# Patient Record
Sex: Female | Born: 1965 | Race: White | Hispanic: No | Marital: Married | Smoking: Never smoker
Health system: Southern US, Community
[De-identification: ages and names within clinical notes are randomized; demographics above are authoritative.]

## PROBLEM LIST (undated history)

## (undated) DIAGNOSIS — E559 Vitamin D deficiency, unspecified: Secondary | ICD-10-CM

## (undated) DIAGNOSIS — E782 Mixed hyperlipidemia: Secondary | ICD-10-CM

## (undated) DIAGNOSIS — I10 Essential (primary) hypertension: Secondary | ICD-10-CM

## (undated) DIAGNOSIS — F419 Anxiety disorder, unspecified: Secondary | ICD-10-CM

## (undated) HISTORY — DX: Anxiety disorder, unspecified: F41.9

## (undated) HISTORY — PX: CHOLECYSTECTOMY: SHX55

## (undated) HISTORY — PX: TUBAL LIGATION: SHX77

## (undated) HISTORY — PX: APPENDECTOMY: SHX54

## (undated) HISTORY — DX: Vitamin D deficiency, unspecified: E55.9

## (undated) HISTORY — DX: Essential (primary) hypertension: I10

## (undated) HISTORY — DX: Mixed hyperlipidemia: E78.2

## (undated) HISTORY — PX: ANKLE FRACTURE SURGERY: SHX122

---

## 2014-06-02 DIAGNOSIS — J302 Other seasonal allergic rhinitis: Secondary | ICD-10-CM | POA: Insufficient documentation

## 2016-04-17 ENCOUNTER — Emergency Department
Admission: EM | Admit: 2016-04-17 | Discharge: 2016-04-17 | Disposition: A | Discharge status: Home / Self Care | Payer: Self-pay | Source: Ambulatory Visit | Attending: Emergency Medicine | Admitting: Emergency Medicine

## 2016-04-17 DIAGNOSIS — J069 Acute upper respiratory infection, unspecified: Secondary | ICD-10-CM

## 2016-04-17 LAB — POCT AMBULATORY RAPID STREP
Exp date: 2018
Lot #: 171282
Rapid Strep Group A Throat-POC: NEGATIVE

## 2016-04-17 LAB — HM HIV SCREENING OFFERED

## 2016-04-17 MED ORDER — IBUPROFEN 200 MG PO TABS *I*
600.0000 mg | ORAL_TABLET | Freq: Once | ORAL | Status: AC
Start: 2016-04-17 — End: 2016-04-17
  Administered 2016-04-17: 600 mg via ORAL
  Filled 2016-04-17: qty 3

## 2016-04-17 MED ORDER — BENZONATATE 200 MG PO CAPS *I*
200.0000 mg | ORAL_CAPSULE | Freq: Three times a day (TID) | ORAL | 0 refills | Status: AC | PRN
Start: 2016-04-17 — End: 2016-05-17

## 2016-04-17 MED ORDER — BENZONATATE 100 MG PO CAPS *I*
200.0000 mg | ORAL_CAPSULE | Freq: Once | ORAL | Status: AC
Start: 2016-04-17 — End: 2016-04-17
  Administered 2016-04-17: 200 mg via ORAL
  Filled 2016-04-17: qty 2

## 2016-04-17 NOTE — ED Notes (Signed)
Pt had chest x-ray. Awaiting results. Will continue care as ordered. Arna Snipehelsea Ramina Hulet, RN

## 2016-04-17 NOTE — ED Provider Notes (Signed)
History     Chief Complaint   Patient presents with    Sore Throat    Cough    Otalgia     HPI Comments: Loretta [name] is a 50 y.o. female previously healthy now presenting with cough, congestion, sore throat, left eye swelling, lost voice, ear pain/fullness.  Patient reportedly started having symptoms 2 days ago.  Patient reports that she has had difficulty taking anything orally due to the pain when swallowing.  Is swallowing secretions.  Also with runny eyes, mostly on the left eye, mild redness around the left eye.  Did take some ibuprofen 800mg  last night and children's claritin because had a hard time swallowing.  Nonsmoker.  Patient then came to [facility] Emergency Department for further management and care.        History provided by:  Patient  Language interpreter used: No      History reviewed. No pertinent past medical history.     Past Surgical History:   Procedure Laterality Date    APPENDECTOMY      CHOLECYSTECTOMY      TUBAL LIGATION       History reviewed. No pertinent family history.    Social History    reports that she has never smoked. She has never used smokeless tobacco. Her alcohol, drug, and sexual activity histories are not on file.    Living Situation     Questions Responses    Patient lives with Family    Homeless No    Caregiver for other family member No    External Services None    Employment Employed    Domestic Violence Risk No          Problem List   There is no problem list on file for this patient.      Review of Systems   Review of Systems   Constitutional: Positive for fatigue.   HENT: Positive for congestion and rhinorrhea.    Eyes: Positive for redness. Negative for pain.   Respiratory: Positive for cough and shortness of breath.    Cardiovascular: Negative for chest pain.   Gastrointestinal: Negative for abdominal pain.   Genitourinary: Negative for dysuria.   Musculoskeletal: Negative for back pain and neck pain.   Skin: Negative for rash.   Neurological: Positive  for headaches.   Hematological: Negative for adenopathy.   Psychiatric/Behavioral: Negative for agitation and behavioral problems.       Physical Exam     ED Triage Vitals   BP Heart Rate Heart Rate (via Pulse Ox) Resp Temp Temp src SpO2 (Retired) O2 Device O2 Flow Rate   04/17/16 0650 04/17/16 0650 -- 04/17/16 0650 04/17/16 0650 04/17/16 0650 04/17/16 0650 -- --   142/65 119  22 37.1 C (98.8 F) TEMPORAL 96 %        Weight           04/17/16 0650           113.4 kg (250 lb)                    Physical Exam   Constitutional: She is oriented to person, place, and time. She appears well-developed and well-nourished.   Appears uncomfortable but no acute distress.   HENT:   Head: Normocephalic and atraumatic.   Throat exam shows swelling in the pharynx, but no erythema, no exudates   Eyes: Conjunctivae are normal.   There is mild redness around the left eye.  There is  moderate conjunctival injection on the left side   Neck: Normal range of motion. Neck supple.   Cardiovascular: Normal rate.    Pulmonary/Chest: Effort normal and breath sounds normal. No respiratory distress. She has no wheezes.   Abdominal: Soft. She exhibits no distension. There is no tenderness.   Musculoskeletal: Normal range of motion. She exhibits no tenderness.   Neurological: She is alert and oriented to person, place, and time. No cranial nerve deficit. She exhibits normal muscle tone.   Skin: Skin is warm and dry.   Psychiatric: She has a normal mood and affect.   Nursing note and vitals reviewed.      Medical Decision Making        Initial Evaluation:  ED First Provider Contact     Date/Time Event User Comments    04/17/16 [phone] ED First Provider Contact Debera LatRAN, Love Chowning Initial Face to Face Provider Contact          Patient seen by me as above    Assessment:  50 y.o.female comes to the ED with 2 days of sore throat, lost voice, cough, subjective fevers, congestion, fullness in ears, redness in left eye, mild headache, no neck rigidity, vitals are  normal now, lungs are clear, there is swelling in the throat.    Differential Diagnosis includes strep pharyngitis, viral illness, dehydration, pneumonia, URI, conjunctivitis                      Plan:   Tessalon for cough  Ibuprofen for the pain  CXR  Rapid strep    CXR clear.  Strep test negative.  Likely viral illness given constellation of symptoms.  Will give script for tessalon for cough.  Encouraged rest, hydration, OTC pain meds.  Discharge with close follow up.      Debera LatHenry Alyssia Heese, MD           Debera Latran, Whitleigh Garramone, MD  04/17/16 [phone]

## 2016-04-17 NOTE — ED Triage Notes (Signed)
Pt comes in with c/o cough, congestion, sore throat, runny eyes. Pt difficulty speaking. Afebrile. Took ibuprofen at 2000 last eveningand kids's Claritin at midnight. Here for further evaluation.      Plan of Care: Will assess and monitor VS and pain q 2-4hrs. Perform frequent rounding. Provide education to patient/caregiver(s) about status and treatment of pt. Provide support to patient/caregiver(s) as needed. Pt oriented to room and use of call bell.           Triage Note   Arna Snipehelsea Tayonna Bacha, RN

## 2016-04-17 NOTE — Discharge Instructions (Signed)
You were evaluated today in the Emergency Department for sore throat, cough, congestion, ear fullness, eye redness.  Your studies showed normal chest x ray and negative strep test.  The diagnosis is likely upper respiratory viral infection.  It was determined that there was no need for hospitalization at this time and the best course of action is to follow up with your primary care doctor in 3-5 days.    You can take ibuprofen/motrin or tylenol/acetaminophen for further management of your pain.  Please take as directed on the box.  Please do not exceed 4 grams (4000 milligrams) of acetaminophen/tylenol in any given 24 hour period.    Please take tessalon for cough.  Prescription sent to your pharmacy for this.    Return to the ED if you experience worsening pain, fever not responsive to tylenol or ibuprofen, inability to take in fluids or food, focal weakness, or other concerning symptoms as these can be signs of a worsening condition. Please feel free to return to the ED for reevaluation at anytime if you feel the condition warrants such a visit.

## 2017-05-23 LAB — TREPONEMA PALLIDUM (SYPHILIS) ANTIBODY (HT)

## 2017-09-22 LAB — UNMAPPED LAB RESULTS
Basophil # (HT): 0 10 3/uL — NL (ref 0.0–0.2)
Basophil % (HT): 0.5 % — NL (ref 0.0–1.8)
Eosinophil # (HT): 0.1 10 3/uL — NL (ref 0.0–0.5)
Eosinophil % (HT): 0.8 % — NL (ref 0.0–7.0)
Hematocrit (HT): 41 % — NL (ref 34.0–47.0)
Hemoglobin (HGB) (HT): 14 g/dL — NL (ref 11.5–16.0)
Lymphocyte # (HT): 2.9 10 3/uL — NL (ref 0.9–3.8)
Lymphocyte % (HT): 33.9 % — NL (ref 17.0–44.0)
MCHC (HT): 34.1 g/dL — NL (ref 32.0–36.0)
MCV (HT): 81.8 FL — NL (ref 81.0–99.0)
Mean Corpuscular Hemoglobin (MCH) (HT): 27.9 pg — NL (ref 26.0–34.0)
Monocyte # (HT): 0.5 10 3/uL — NL (ref 0.2–1.0)
Monocyte % (HT): 5.7 % — NL (ref 4.0–12.0)
Neutrophil # (HT): 5 10 3/uL — NL (ref 1.5–7.7)
Platelets (HT): 321 10 3/uL — NL (ref 140–400)
RBC (HT): 5.01 10 6/uL — NL (ref 3.80–5.20)
RDW (HT): 13.1 % — NL (ref 11.5–15.0)
Seg Neut % (HT): 59.1 % — NL (ref 40.0–75.0)
WBC (HT): 8.5 10 3/uL — NL (ref 4.0–10.8)

## 2017-12-29 ENCOUNTER — Ambulatory Visit: Payer: Medicaid Other | Admitting: Physical Medicine and Rehabilitation

## 2017-12-29 ENCOUNTER — Encounter: Payer: Self-pay | Admitting: Physical Medicine and Rehabilitation

## 2017-12-29 ENCOUNTER — Ambulatory Visit
Admission: RE | Admit: 2017-12-29 | Discharge: 2017-12-29 | Disposition: A | Discharge status: Home / Self Care | Payer: Medicaid Other | Source: Ambulatory Visit | Attending: Physical Medicine and Rehabilitation | Admitting: Physical Medicine and Rehabilitation

## 2017-12-29 VITALS — BP 122/79 | HR 105

## 2017-12-29 DIAGNOSIS — S83241A Other tear of medial meniscus, current injury, right knee, initial encounter: Secondary | ICD-10-CM

## 2017-12-29 DIAGNOSIS — M25569 Pain in unspecified knee: Secondary | ICD-10-CM | POA: Insufficient documentation

## 2017-12-29 DIAGNOSIS — M25561 Pain in right knee: Secondary | ICD-10-CM | POA: Insufficient documentation

## 2017-12-29 DIAGNOSIS — M25562 Pain in left knee: Secondary | ICD-10-CM

## 2017-12-29 MED ORDER — MELOXICAM 15 MG PO TABS *I*
15.0000 mg | ORAL_TABLET | Freq: Every day | ORAL | 2 refills | Status: DC
Start: 2017-12-29 — End: 2018-04-03

## 2017-12-29 MED ORDER — TRIAMCINOLONE ACETONIDE 40 MG/ML IJ SUSP *I*
40.0000 mg | Freq: Once | INTRAMUSCULAR | Status: AC | PRN
Start: 2017-12-29 — End: 2017-12-29
  Administered 2017-12-29: 40 mg via INTRA_ARTICULAR

## 2017-12-29 MED ORDER — LIDOCAINE HCL 1 % IJ SOLN *I*
2.0000 mL | Freq: Once | INTRAMUSCULAR | Status: AC | PRN
Start: 2017-12-29 — End: 2017-12-29
  Administered 2017-12-29: 2 mL via INTRA_ARTICULAR

## 2017-12-29 NOTE — Patient Instructions (Signed)
-   Schedule with PT, do the exercises on your own at home  - Try the brace as needed, especially for more strenuous activity  - While you are taking meloxicam , avoid any other NSAIDs, including over-the-counter ibuprofen (Advil, Motrin) or naproxen (Aleve).  - Tylenol (acetaminophen) is ok to take at the same time, just don't go above 3000 mg per day (total of 6 extra-strength tabs).  - Come back in 2 months    PM&R Post Injection/Procedure Instructions    You received a steroid/cortisone injection today.  This injection has numbing medicine (Lidocaine) and steroid medication.  The numbing medicine may start to work right away and give you immediate pain relief.  However, this will wear off in several hours.  It can take 5-7 days and sometimes up to two weeks for the steroid medication to take effect.  Therefore, please give the injection several days to start to notice a benefit.     Notify the office [phone] if you experience any of the following after todays procedure:   Fever of 100 degrees Farenheit or 38 degrees Celsius or greater   Excessive swelling or redness at the injection site   Excessive pain or numbness that lasts longer than 72 hours after your procedure.   Seek urgent medical care if staff is unavailable.   Care of the injection site:   You may ice the injection site for local discomfort.  Ice should be covered with a cloth - Never place directly on skin.   Apply ice for 15 minutes on then off for 1 hour.  Repeat as needed.   Take band aid off any time   Do not submerge the area in water for 24 hours - showering is OK  Diabetic patients:   Check your blood sugar level every 8 hours for the next 72 hours.     Your blood sugar can be elevated due to the steroid used in the injection - call your primary care physician if you go above 300.  Common side effects of injections:   Bruising and minor swelling at site   Increased pain   Numbness for the first 6-8 hours after injection -  this should subside or return to baseline   Increased blood sugar   Sometimes the medication can cause skin pigment lightening or changes

## 2017-12-29 NOTE — Procedures (Signed)
Large Joint Aspiration/Injection Procedure    Date/Time: 12/29/2017 11:28 AM  Consent given by: patient  Site marked: site marked  Timeout: Immediately prior to procedure a time out was called to verify the correct patient, procedure, equipment, support staff and site/side marked as required     Procedure Details    Location: knee - R knee intra - articular  Preparation: The site was prepped using the usual aseptic technique.  Anesthetics administered: 2 mL lidocaine hcl 1 %  Intra-Articular Steroids administered: 40 mg triamcinolone acetonide 40 MG/ML  Dressing:  A dry, sterile dressing was applied.  Patient tolerance: patient tolerated the procedure well with no immediate complications      Daryel Gerald DO  Assistant Professor of Physical Medicine and Rehabilitation

## 2017-12-29 NOTE — Progress Notes (Signed)
Physical Medicine and Rehabilitation Clinic Note - New Patient  Patient: Loretta [name]  MRN: [id number]  DOB: 24-Dec-1965   Date: 12/31/2017   Referring Provider: Self     Chief Complaint: Loretta [name] is a 52 y.o. right handed school bus driver for [employer] who presents today for new evaluation of right knee pain.    Subjective   History of Present Illness:    The pain began 12/03/17 without specific injury: she was at a country music concert the night before, dancing, no specific injury, but the next morning woke up with right knee pain. This progressed over time and she noted swelling and difficulty bending the knee.     She went to urgent care in [facility] 12/09/17 and they gave her crutches and Ace wrap, and recommended she slowly progress weight bearing. She also had a course of prednisone that helped the swelling but not the pain. She has been able to bear weight now but it is still painful.       Pain    12/29/17 1033   PainSc:   4   PainLoc: Knee        Pain is located at the right medial knee, described as dull, sharp and achy. Symptoms are constant, improved by nothing. They are worsened with moving, especially walking or bending the knee. She endorses swelling, but denies locking or catching. Symptoms interfere with mobility, work, sleep and exercise.      Previous studies:  XR right knee 12/29/17 - my independent review of the images with the patient indicates: no significant joint space narrowing, subchondral sclerosis or osteophytes.     Treatments so far:     Physical therapy:   Yes []  No [x]  Helpful []  Somewhat helpful []  Not helpful []     Activity modification:   Yes [x]  No []  Helpful []  Somewhat helpful [x]  Not helpful []     Brace/Assistive device: crutches  Yes [x]   No []   Helpful []   Somewhat helpful [x]   Not helpful []      Acetaminophen:   Yes []  No [x]  Helpful []  Somewhat helpful []  Not helpful []     NSAIDs: naproxen, ibuprofen over-the-counter    Yes [x]  No []  Helpful []  Somewhat helpful []  Not  helpful [x]      Topicals:  Yes []   No [x]   Helpful []   Somewhat helpful []   Not helpful []      Other Medications: oral steroids  Yes [x]   No []   Helpful []   Somewhat helpful [x]   Not helpful []      Corticosteroid injections:  Yes []  No [x]  Helpful []  Somewhat helpful []  Not helpful []     Viscosupplementation:  Yes []  No [x]  Helpful []  Somewhat helpful []  Not helpful []     Radiofrequency Ablation:  Yes []   No [x]   Helpful []   Somewhat helpful []   Not helpful []      Surgery:  Yes []  No [x]  Helpful []  Somewhat helpful []  Not helpful []     Social:  Tobacco: Denies  Alcohol: Reports 5-8 drinks per week    ROS: A comprehensive 12 point review of systems was reviewed and was negative apart from the HPI and recent weight loss (intentional), difficulty sleeping, anxiety      No current outpatient prescriptions on file prior to visit.     No current facility-administered medications on file prior to visit.       Patient Active Problem List   Diagnosis Code  Seasonal allergic rhinitis J30.2     Past Medical History:   Diagnosis Date    Anxiety      Family History   Problem Relation Age of Onset    High Blood Pressure Mother     Cataracts Mother     Depression Mother     Arthritis Mother     Diabetes Mother     Neuromuscular disorder Mother     Neuromuscular disorder Sister     Arthritis Sister     Depression Sister     Diabetes Sister     High Blood Pressure Sister         Objective     Physical Exam:  MSK:   A complete musculoskeletal examination of the right knee was performed, details include:  Inspection: There is increased soft tissue swelling and warmth. No significant joint deformity, ecchymosis or erythema. There is 1+ effusion. There is no significant varum, valgum or recurvatum deformity.  ROM: Flexion to 100, extension to 0, without pain at the end-range of flexion There is no crepitus.  Palpation: There is concordant tenderness to palpation of the medial joint line.   Special Tests:   McMurray with  concordant pain on medial stress  Stable to varus, valgus, posterior drawer and Lachman without pain.   Patellar grind without pain    Neuro:  Lower limb neurologic exam:  Strength is 5/5 and symmetric.  Reflexes are 2+/4 and symmetric.  Sensation to light touch is normal.  Gait is antalgic to the right    Vital signs: BP 122/79    Pulse 105    Constitutional: Well developed, well nourished in no acute distress.  Eyes: Conjunctivae clear  Psychiatric: Pleasant, alert, oriented.  Respiratory: Breathing unlabored without wheezing or coughing.  Vascular: No cyanosis  Skin: No overlying skin lesions, rash or ecchymosis          Assessment    IMPRESSION:  1. Tear of medial meniscus of right knee, current, unspecified tear type, initial encounter    2. Knee pain, unspecified chronicity, unspecified laterality         RECOMMENDATIONS:  - Long discussion today about the various treatment options for meniscal tear  - PT referral to improve ROM and strength, gait training, progress to home exercise program  - Counseled on the importance of weight loss - she is already working on this.  - Short course of meloxicam 15 mg daily; advised to avoid other NSAIDs  - Tylenol as needed; OK to take with NSAIDs  - Right knee injection today (see procedure note)  - Return in about 2 months (around 02/28/2018). If not improving, consider MRI      Diagnoses and plan was discussed with the patient, who was educated on above diagnoses and treatments, including alternatives            Daryel Gerald DO  Assistant Professor of Physical Medicine and Rehabilitation     This note was typed / dictated at point of care.  A reasonable amount of effort and time was spent proofreading, however, errors may occur.

## 2018-01-24 ENCOUNTER — Ambulatory Visit
Payer: Medicaid Other | Attending: Physical Medicine and Rehabilitation | Admitting: Rehabilitative and Restorative Service Providers"

## 2018-01-24 DIAGNOSIS — S83241D Other tear of medial meniscus, current injury, right knee, subsequent encounter: Secondary | ICD-10-CM | POA: Insufficient documentation

## 2018-01-24 NOTE — Progress Notes (Signed)
[facility] REHABILITATION LE EVALUATION      Diagnosis: Right meniscus tear  Onset date:  12/03/2017  Date of surgery: NA  Cortisone injection: 12/29/2017    History    Loretta [name] is a 52 y.o. female who is present today for right knee care.   Mechanism of injury/history of symptoms:  No specific cause.  Patient reports that she injured her knee on 8/11 after dancing at a concert. States that the next two days her knee was painful, decreased in pain, then increased again at the end of the weak and continues to be painful. States that her has started to feel better since the cortisone injection. States that she has the most pain with kneeling and when walking on uneven ground. States that she has been wearing a knee sleeve that seems to help with her pain. 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  Chiropractor of job: Animator, Veterinary surgeon, Nutritional therapist, Prolonged Standing, Prolonged Sitting, Tool Use  Stresses/physical demands of home: Self Care, Housekeeping, Gardening/Yard Work, Facilities manager): Walking  Other: NA  Diagnostic tests: Per report, reviewed, X-ray    Symptom location: Medial and Anterior, right  Relevant symptoms:  Aching, Burning, Pain , Decreased ROM, Decreased strength, Swelling, Buckling/Giving way  Symptom frequency: Constant  Symptom intensity:  (0 - 10 scale): Now 2 Best 1 Worst 8   Night Pain: Yes   Restful sleep:   Yes  Morning Pain/Stiffness: None   Symptoms worsen with: Squatting, Kneeling, Weight bearing  Symptoms improve with: Rest, Ice  Assistive device:  none  Patients goals for therapy: Reduce pain, Increase ROM / flexibility, Increase strength, Achieve independence with home program for self care / condition management      Objective    Observation: Effusion  Gait:  Normal    Lumbar Screen:  WNL  Neurologic:  Sensation:  LE  Intact to gross screen    Palpation:  swelling and tenderness @ medial joint line  Incision:  NA      ROM/Strength      HIP LEFT RIGHT STRENGTH    PROM AROM PROM AROM Left Right   Flexion     5 5   Extension         Abduction     4- 4-   IR         ER         Adduction                        KNEE LEFT RIGHT STRENGTH    PROM AROM PROM AROM Left Right   Flexion  128  125 5 5   Extension  2 HE  0 5 5                  ANKLE LEFT RIGHT STRENGTH    PROM AROM PROM AROM Left Right   DF (knee ext)         DF (knee flex)         PF         INV         EV  CKC Ankle DF Knee to Wall (cm)              LE Flexibility  Hamstring:  Fair      Special Tests:   Hip FADIR,  negative  FABER,  negative   Knee Valgus,   negative, Painful  Varus,  negative  Anterior Drawer,  negative  Posterior Drawer,  negative  McMurray,  positive   Ankle NA          Functional:  Ascend stairs with reciprocal gait - able to perform.  Descend stairs with reciprocal gait - able to perform.  Return to work full duty - unable to perform.  PROMIS Physical Function [Adult] 01/24/2018 9:37:29 AM 40    -Questions & Answers   Does your health now limit you in doing two hours of physical labor?  Somewhat   Does your health now limit you in doing yard work like raking leaves, weeding, or pushing a Surveyor, mining?  Quite a lot   Are you able to do chores such as vacuuming or yard work?  With some difficulty   Does your health now limit you in walking more than a mile (1.6 km)?  Somewhat           Assessment:    Findings consistent with 52 y.o. female with right meniscus tear with pain, ROM limitations, strength limitations, functional limitations. Patient demonstrates good motion and good strength at this time. Patient has pain with end range flexion and extension and pain with weight bearing pivoting that is consistent with meniscus tear. Patient was  able to tolerate all exercises with minimal increase in pain, and was able to perform with good form once given cueing. Patient will benefit from skilled PT service to address above impairments and functional limitations such as not being able to return to full work at this time.     Personal factors affecting treatment/recovery:   Single parent  Comorbidities affecting treatment/recovery:   Obesity  Clinical presentation:   stable  Patient complexity:     low level as indicated by above stability of condition, personal factors, environmental factors and comorbidities in addition to patient symptom presentation and impairments found on physical exam.    Prognosis:  Good   Contraindications/Precautions/Limitation:  Per diagnosis  Short Term Goals: (2 week(s)): Decrease c/o max pain to < 4/10 and Minimal assistance with HEP/ education concepts  Long Term Goals: (6 week(s)): Pain/Sx 0 - minimal, ROM/ flexibility WNL , Restoration of functional strength, Independent with HEP/education , Functional return to ADLs / activities without limitations     Treatment Plan:   Options / plan reviewed with patient/family:  Yes  Freq 1-2 times per week/month with adjusted visit frequency per patient status/protocol for 2 month(s)    Treatment plan inclusive of:   Exercise: AROM, AAROM, PROM, Stretching, Strengthening, Progressive Resistive, Coordination, Aerobic exercise   Manual Techniques:  Myofascial Release, Joint mobilization, Soft tissue release/massage   Modalities:  Biofeedback, Cryotherapy, Desensitization, Functional/Therapeutic activites per flowsheet, Joint Mobilizations, Manual therapy,  Joint Mobilization, Soft tissue Mobilization / Massage, Moist heat, Neuromuscular Re-ed,  Activity balance, proprioception, motor control, TENS, Ther Exercise per flowsheet, Vasopneumatic Treatment   Functional: Proprioception/Dynamic stability, Functional rehab, Postural training, Gait training, Balance , Endurance training, Home  management    Thank you for referring this patient to [facility].    Priscille Kluver, PT    Hamstring stretch    bridge  sidleying abd    squat    SLS                           Minutes   Time Based CPT  Physical Performance test, Therapeutic Exercise, Therapeutic Activities, NM Re-Education, Manual Therapy, Gait Training, Massage, Aquatic Therapy, Canalith Repositioning, Iontophoresis, Ultrasound, Orthotic fitting/training, Prosthetic fitting 20   Service Based (untimed) CPT  PT/OT evaluation, PT/OT re-eval, E-stim-unattended, Mechanical traction, Vasopneumatic device 20   Unbilled time (rest, etc) 5       Total Treatment Time 45

## 2018-01-29 ENCOUNTER — Ambulatory Visit: Payer: Medicaid Other | Admitting: Rehabilitative and Restorative Service Providers"

## 2018-01-29 DIAGNOSIS — S83241D Other tear of medial meniscus, current injury, right knee, subsequent encounter: Secondary | ICD-10-CM

## 2018-01-29 NOTE — Progress Notes (Signed)
[facility] Orthopedic Sports/Spine Rehabilitation  PT Treatment Note    Todays Date: 01/29/2018    Name: Loretta [name]  DOB: 25-Feb-1966  Referring Physician: Timoteo Ace, DO  Diagnosis:   1. Tear of medial meniscus of right knee, current, unspecified tear type, subsequent encounter         Visit #: 2    Subjective:  Pain Assessment: 7  Pt reports an increase in pain intensity and frequency since previous treatment session.  Patient reports that her pain has increased since last session. States that she had to do a lot of long distance driving over the week, and afterwards her knee "was very swollen and very painful." States that the weight bearing exercises are now painful, but she continues to do them.      Objective:  ROM -   KNEE LEFT RIGHT STRENGTH    PROM AROM PROM AROM Left Right   Flexion  128  125 5 5   Extension  2 HE  0 5 5              Strength - Therapeutic Exercises per flowsheet  Function: - Worsened  Education:  Updated HEP, Verbal cues for ther ex, Manual cues for ther ex    Objective        Treatment:  Ther Exercise per flowsheet     Hamstring stretch 3x20 sec   Quad set x10   SLR 3x5   bridge 2x10   sidleying abd 2x10   squat    SLS    Step ups add   Side steps add           Ice  10 min       Assessment:   Patient presents with worsened pain and function since last session. All weight bearing exercises were discontinued at this time to allow for pain and swelling to decrease. Patient was able to tolerate all NWB exercises without increase in pain. Responded well to ice at the end of the session. Will attempt higher level exercises next session.        Plan of Care:  Continue per Plan of care -  As written; Patient would benefit from skilled rehabilitation services to address the above impairments to restore functional capacity.    Thank you for referring this patient to [facility] of [facility] Orthopaedics - Sports and Spine Rehabilitation    Pr[place]lu[place], [place]       Minutes    Time Based CPT  Physical Performance test, Therapeutic Exercise, Therapeutic Activities, NM Re-Education, Manual Therapy, Gait Training, Massage, Aquatic Therapy, Canalith Repositioning, Iontophoresis, Ultrasound, Orthotic fitting/training, Prosthetic fitting 30   Service Based (untimed) CPT  PT/OT evaluation, PT/OT re-eval, E-stim-unattended, Mechanical traction, Vasopneumatic device    Unbilled time (rest, etc) 5       Total Treatment Time 35

## 2018-02-06 ENCOUNTER — Ambulatory Visit: Payer: Medicaid Other | Admitting: Rehabilitative and Restorative Service Providers"

## 2018-02-06 NOTE — Progress Notes (Deleted)
[facility] Orthopedic Sports/Spine Rehabilitation  PT Treatment Note    Todays Date: 02/06/2018    Name: Loretta [name]  DOB: Sep 13, 1965  Referring Physician: Timoteo Ace, DO  Diagnosis:   No diagnosis found.    Visit #: Visit count could not be calculated. Make sure you are using a visit which is associated with an episode.    Subjective:  Pain Assessment: 7  Pt reports an increase in pain intensity and frequency since previous treatment session.  Patient reports that her pain has increased since last session. States that she had to do a lot of long distance driving over the week, and afterwards her knee "was very swollen and very painful." States that the weight bearing exercises are now painful, but she continues to do them.      Objective:  ROM -   KNEE LEFT RIGHT STRENGTH    PROM AROM PROM AROM Left Right   Flexion  128  125 5 5   Extension  2 HE  0 5 5              Strength - Therapeutic Exercises per flowsheet  Function: - Worsened  Education:  Updated HEP, Verbal cues for ther ex, Manual cues for ther ex    Objective        Treatment:  Ther Exercise per flowsheet     Hamstring stretch 3x20 sec   Quad set x10   SLR 3x5   bridge 2x10   sidleying abd 2x10   squat    SLS    Step ups add   Side steps add           Ice  10 min       Assessment:   Patient presents with worsened pain and function since last session. All weight bearing exercises were discontinued at this time to allow for pain and swelling to decrease. Patient was able to tolerate all NWB exercises without increase in pain. Responded well to ice at the end of the session. Will attempt higher level exercises next session.        Plan of Care:  Continue per Plan of care -  As written; Patient would benefit from skilled rehabilitation services to address the above impairments to restore functional capacity.    Thank you for referring this patient to [facility] of [facility] Orthopaedics - Sports and Spine Rehabilitation    [place], [place]       Minutes   Time Based CPT  Physical Performance test, Therapeutic Exercise, Therapeutic Activities, NM Re-Education, Manual Therapy, Gait Training, Massage, Aquatic Therapy, Canalith Repositioning, Iontophoresis, Ultrasound, Orthotic fitting/training, Prosthetic fitting 30   Service Based (untimed) CPT  PT/OT evaluation, PT/OT re-eval, E-stim-unattended, Mechanical traction, Vasopneumatic device    Unbilled time (rest, etc) 5       Total Treatment Time 35

## 2018-02-20 ENCOUNTER — Ambulatory Visit: Payer: Medicaid Other | Admitting: Rehabilitative and Restorative Service Providers"

## 2018-02-20 DIAGNOSIS — S83241D Other tear of medial meniscus, current injury, right knee, subsequent encounter: Secondary | ICD-10-CM

## 2018-02-20 NOTE — Progress Notes (Signed)
[facility] Orthopedic Sports/Spine Rehabilitation  PT Treatment Note    Todays Date: 02/20/2018    Name: RIVERLYN KIZZIAH  DOB: [birth year]  Referring Physician: Timoteo Ace, DO  Diagnosis:   1. Tear of medial meniscus of right knee, current, unspecified tear type, subsequent encounter         Visit #: 3    Subjective:  Pain Assessment: 7  Pt reports no significant changes in pain or function since previous treatment session.  Patient reports that her knee was feeling better before vacation, but is now back to the way it was feeling last session. States that she is having pain at the joint line of her knee that is worse with stairs and walking. Denies giving way but states that she does have times when her knee feels like it is locking at her knee cap. Reports pain with hyper extension.      Objective:  ROM -   KNEE LEFT RIGHT STRENGTH    PROM AROM PROM AROM Left Right   Flexion  128  125 5 5   Extension  2 HE  0 5 5              Strength - Therapeutic Exercises per flowsheet  Function: - Worsened  Education:  Updated HEP, Verbal cues for ther ex, Manual cues for ther ex    Objective        Treatment:  Ther Exercise per flowsheet     Hamstring stretch 3x20 sec   Quad set x10   SLR 4x5   bridge 10x10 sec   sidleying abd 2x10   squat    SLS    Step ups    Side steps x3           Ice  10 min       Assessment:   Patient presents with no change in pain since last session, but continues to have worsened pain and function since starting physical therapy. Patient was able to tolerate all table exercises, but had increase in pain with weight bearing exercise. Pain at medial joint line with hyperextension and pain with stairs is indicative of medial meniscus tear and will continue to be monitored.        Plan of Care:  Continue per Plan of care -  As written; Patient would benefit from skilled rehabilitation services to address the above impairments to restore functional capacity.    Thank you for referring this patient to  [facility] of [facility] Orthopaedics - Sports and Spine Rehabilitation    [place], [place]       Minutes   Time Based CPT  Physical Performance test, Therapeutic Exercise, Therapeutic Activities, NM Re-Education, Manual Therapy, Gait Training, Massage, Aquatic Therapy, Canalith Repositioning, Iontophoresis, Ultrasound, Orthotic fitting/training, Prosthetic fitting 30   Service Based (untimed) CPT  PT/OT evaluation, PT/OT re-eval, E-stim-unattended, Mechanical traction, Vasopneumatic device    Unbilled time (rest, etc) 5       Total Treatment Time 35

## 2018-03-02 ENCOUNTER — Ambulatory Visit
Payer: Medicaid Other | Attending: Physical Medicine and Rehabilitation | Admitting: Rehabilitative and Restorative Service Providers"

## 2018-03-02 DIAGNOSIS — S83241D Other tear of medial meniscus, current injury, right knee, subsequent encounter: Secondary | ICD-10-CM | POA: Insufficient documentation

## 2018-03-02 NOTE — Progress Notes (Signed)
[facility] Orthopedic Sports/Spine Rehabilitation  PT Treatment Note    Todays Date: 03/02/2018    Name: ROSHINI FULWIDER  DOB: 08-15-1965  Referring Physician: Timoteo Ace, DO  Diagnosis:   1. Tear of medial meniscus of right knee, current, unspecified tear type, subsequent encounter         Visit #: 4    Subjective:  Pain Assessment: 7  Pt reports no significant changes in pain or function since previous treatment session.  Patient reports that her knee continues to feel about the same. States that stairs are extremely painful, and she is unable to bend her knee or twist without increase in pain. States that she has a constant pain at the joint line of her right knee. States that she hasn't been doing her exercises because they increase her pain.      Objective:  ROM - Not assessed today   KNEE LEFT RIGHT STRENGTH    PROM AROM PROM AROM Left Right   Flexion  128  125 5 5   Extension  2 HE  0 5 5              Strength - Therapeutic Exercises per flowsheet  Function: - Worsened  Education:  Updated HEP, Verbal cues for ther ex, Manual cues for ther ex    Objective        Treatment:  Ther Exercise per flowsheet     Hamstring stretch 3x20 sec   Quad set x10 held   SLR 4x5 held   bridge    sidleying clamshell x15   squat    SLS    Step ups    Side steps x3   lunge        Ice  10 min       Assessment:   Patient presents with no change in pain since last session, but continues to have worsened pain and function since starting physical therapy. Patient was only able to tolerate abduction exercises, and still had increased pain with them. Patient's pain and limited function is most likely due to a meniscus tear. Patient was educated on only performing activities that do not significantly increase her pain at this time. Patient agreed with plan.        Plan of Care:  Continue per Plan of care -  As written; Patient would benefit from skilled rehabilitation services to address the above impairments to restore functional  capacity.    Thank you for referring this patient to [facility] of [facility] Orthopaedics - Sports and Spine Rehabilitation    [place], [place]       Minutes   Time Based CPT  Physical Performance test, Therapeutic Exercise, Therapeutic Activities, NM Re-Education, Manual Therapy, Gait Training, Massage, Aquatic Therapy, Canalith Repositioning, Iontophoresis, Ultrasound, Orthotic fitting/training, Prosthetic fitting 30   Service Based (untimed) CPT  PT/OT evaluation, PT/OT re-eval, E-stim-unattended, Mechanical traction, Vasopneumatic device    Unbilled time (rest, etc) 5       Total Treatment Time 35

## 2018-03-05 ENCOUNTER — Ambulatory Visit
Payer: Medicaid Other | Attending: Physical Medicine and Rehabilitation | Admitting: Physical Medicine and Rehabilitation

## 2018-03-05 ENCOUNTER — Encounter: Payer: Self-pay | Admitting: Physical Medicine and Rehabilitation

## 2018-03-05 VITALS — BP 148/73 | HR 95

## 2018-03-05 DIAGNOSIS — S8980XA Other specified injuries of unspecified lower leg, initial encounter: Secondary | ICD-10-CM

## 2018-03-05 DIAGNOSIS — S83241A Other tear of medial meniscus, current injury, right knee, initial encounter: Secondary | ICD-10-CM | POA: Insufficient documentation

## 2018-03-05 NOTE — Patient Instructions (Signed)
-   Schedule your MRI  - We will call you with results

## 2018-03-05 NOTE — Progress Notes (Addendum)
Physical Medicine and Rehabilitation Clinic Note - Follow Up Visit  Patient: Loretta [name]   MRN: [id number]  DOB: 1966-04-24   Date: 03/05/2018   Last Seen: 12/29/2017    Chief Complaint: Loretta [name] presents today for follow up of right knee pain.    Subjective       History of Present Illness:    Pain    03/05/18 1117   PainSc:   6   PainLoc: Knee        Last visit, referred to PT and injected right knee.    She had severe pain 24 hours after the shot, then dulled somewhat, then about 5 days later had significant relief, but then came back about 2 weeks later.    She attended 4 visits of physical therapy with home exercise program. She has not had significant improvement.     She has less pain with regular squatting, but all other activities continue to be painful.    Currently, pain is located at the right medial knee, described as dull, sharp, butrning and achy. Symptoms are constant, improved by nothing. They are worsened with walking, laying down, sitting. She endorses swelling but denies locking and catching. Symptoms interfere with mobility, exercise and sleep.    Recall (12/29/2017):   The pain began 12/03/17 without specific injury: she was at a country music concert the night before, dancing, no specific injury, but the next morning woke up with right knee pain. This progressed over time and she noted swelling and difficulty bending the knee.     She went to urgent care in [facility] 12/09/17 and they gave her crutches and Ace wrap, and recommended she slowly progress weight bearing. She also had a course of prednisone that helped the swelling but not the pain. She has been able to bear weight now but it is still painful.     Pain is located at the right medial knee, described as dull, sharp and achy. Symptoms are constant, improved by nothing. They are worsened with moving, especially walking or bending the knee. She endorses swelling, but denies locking or catching. Symptoms interfere with mobility, work,  sleep and exercise.      Previous studies:  XR right knee 12/29/17: no significant joint space narrowing, subchondral sclerosis or osteophytes.      Treatments so far:     Physical therapy: 4 visits   Yes [x]  No []  Helpful []  Somewhat helpful []  Not helpful [x]     Brace/Assistive device: crutches  Yes [x]   No []   Helpful []   Somewhat helpful [x]   Not helpful []      Acetaminophen:   Yes []  No [x]  Helpful []  Somewhat helpful []  Not helpful []     NSAIDs: meloxicam   Yes [x]  No []  Helpful [x]  Somewhat helpful []  Not helpful [x]      Topicals:  Yes []   No [x]   Helpful []   Somewhat helpful []   Not helpful []      Other Medications: oral steroids  Yes [x]   No []   Helpful []   Somewhat helpful [x]   Not helpful []      Corticosteroid injections: 12/29/17 - 2 weeks relief  Yes [x]  No []  Helpful []  Somewhat helpful [x]  Not helpful []     Viscosupplementation:  Yes []  No [x]  Helpful []  Somewhat helpful []  Not helpful []     Radiofrequency Ablation:  Yes []   No [x]   Helpful []   Somewhat helpful []   Not helpful []      Surgery:  Yes []  No [x]  Helpful []  Somewhat helpful []  Not helpful []     ROS: A comprehensive 12 point review of systems was reviewed and was negative apart from the HPI and anxiety       Objective     Physical Exam:  MSK:   A limited musculoskeletal examination of the right knee was performed, details include:  Inspection: There is mild soft tissue swelling about the knee. No significant gross deformity, erythema or ecchymosis. No significant varum, valgum or recurvatum deformity.  ROM: Flexion to 135, extension to 0, without pain. There is no crepitus.  Palpation: Mild warmth about the knee. Trace effusion.  There is concordant tenderness to palpation of the medial joint line.   Special Tests:   McMurray causes pain on medial and lateral stress without click.    Gait is antalgic to the right    Vital signs: BP 148/73    Pulse 95    Constitutional: Well developed, well nourished in no acute distress.  Psychiatric:  Pleasant, alert, oriented.  Skin: No overlying skin lesions, rash or ecchymosis          Assessment    IMPRESSION:  1. Tear of medial meniscus of right knee, current, unspecified tear type, initial encounter    2. Other specified injuries of unspecified lower leg, initial encounter         RECOMMENDATIONS:  Orders Placed This Encounter   Procedures    MR knee RIGHT without contrast     - She has failed conservative treatment including physical therapy, home exercise program, NSAIDs and steroid injection  - MRI right knee to evaluate for internal derangement, for possible surgical planning  - No change in medications  - Hold off on injection until MRI results  - We will call her with results; refer to orthopedic surgery vs schedule for injection.            Daryel Gerald DO  Assistant Professor of Physical Medicine and Rehabilitation     This note was typed / dictated at point of care.  A reasonable amount of effort and time was spent proofreading, however, errors may occur.

## 2018-03-08 ENCOUNTER — Encounter: Payer: Self-pay | Admitting: Rehabilitative and Restorative Service Providers"

## 2018-03-09 ENCOUNTER — Ambulatory Visit: Payer: Medicaid Other | Admitting: Rehabilitative and Restorative Service Providers"

## 2018-03-13 ENCOUNTER — Ambulatory Visit
Admission: RE | Admit: 2018-03-13 | Discharge: 2018-03-13 | Disposition: A | Discharge status: Home / Self Care | Payer: Medicaid Other | Source: Ambulatory Visit

## 2018-03-13 DIAGNOSIS — S8980XA Other specified injuries of unspecified lower leg, initial encounter: Secondary | ICD-10-CM

## 2018-03-13 DIAGNOSIS — M223X1 Other derangements of patella, right knee: Secondary | ICD-10-CM

## 2018-03-13 DIAGNOSIS — S83241A Other tear of medial meniscus, current injury, right knee, initial encounter: Secondary | ICD-10-CM

## 2018-03-14 ENCOUNTER — Encounter: Payer: Self-pay | Admitting: Physical Medicine and Rehabilitation

## 2018-03-14 DIAGNOSIS — S83241A Other tear of medial meniscus, current injury, right knee, initial encounter: Secondary | ICD-10-CM

## 2018-03-28 ENCOUNTER — Encounter: Payer: Self-pay | Admitting: Orthopedic Surgery

## 2018-03-28 ENCOUNTER — Ambulatory Visit: Payer: Medicaid Other | Attending: Orthopedic Surgery | Admitting: Orthopedic Surgery

## 2018-03-28 VITALS — BP 122/77 | HR 102 | Ht 63.5 in | Wt 225.0 lb

## 2018-03-28 DIAGNOSIS — S838X1A Sprain of other specified parts of right knee, initial encounter: Secondary | ICD-10-CM

## 2018-03-28 NOTE — Progress Notes (Signed)
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  On August 11, the day after she had been standing and dancing at a concert, she felt and medial pain in her right knee.  She also developed a knee effusion.  She was seen in urgent care center she says she was told she had "tendinitis".  With continued pain [facility] Cowboy[facility]ford.  He performed a cortisone injection center for therapy.  She had continued symptoms and then was sent for an MRI examination now presents for orthopedic consultation.  No problem with her knee prior to August 11.    Past medical history otherwise significant for hypertension, asthma, hepatitis in 1998, hand fracture    Past surgical history significant for tubal ligation, cholecystectomy, ganglion cyst removal, appendectomy    Medications: Meloxicam and Prozac    Allergies: Sulfa causes hives, penicillin causes a rash and nausea, she had nosebleeds when she was taking aspirin    Review of systems otherwise is here for psoriasis    Physical examination right knee: BMI equals 39.23.  She has full range of motion of the right knee.  She has no tenderness over the quadriceps tendon, patella, patellar tendon, or tibial tuberosity.  She has no medial or lateral patellar facet tenderness.  She has medial joint line tenderness with no lateral joint line tenderness.  The knee is stable to varus and valgus stress with a negative Lachman examination and a negative posterior drawer.  On circumduction maneuvers she does have medial pain.  No lateral symptoms with circumduction.  Calf is soft and nontender.  Neurovascularly intact distally.    Review of weightbearing radiographs she had performed on September 6 shows a moderate knee effusion and is otherwise within normal limits.  Joint spaces are well maintained.    Review of her MRI examination from November 19 shows a medial meniscus tear.  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    Impression: Right knee medial meniscus tear    Plan: I long discussion with her regarding this.  I did review treatment option of arthroscopy of the right knee with partial medial meniscectomy.  Most likely she does wish to go ahead with this and will contact my office regarding scheduling.  Questions are answered and a large portion of today's visit was spent education and counseling the patient.    This note was transcribed using voice recognition software.  Please excuse irregularities that may result.

## 2018-04-02 ENCOUNTER — Other Ambulatory Visit: Payer: Self-pay | Admitting: Orthopedic Surgery

## 2018-04-02 MED ORDER — HYDROCODONE-ACETAMINOPHEN 5-325 MG PO TABS *I*
ORAL_TABLET | ORAL | 0 refills | Status: DC
Start: 2018-04-02 — End: 2018-04-13
  Filled 2018-04-02: qty 15, 3d supply, fill #0

## 2018-04-02 NOTE — Anesthesia Preprocedure Evaluation (Addendum)
Anesthesia Pre-operative History and Physical for Loretta [name]    Highlighted Issues for this Procedure:  52 y.o. female with Acute medial meniscus tear of right knee (Z61.096E(S83.241A) presenting for Procedure(s) (LRB):  ARTHROSCOPY, KNEE, WITH MENISCECTOMY (Right) by Surgeon(s):  Roselie AwkwardBronstein, Robert, MD scheduled for 60 minutes.        .  .  Anesthesia Evaluation Information Source: patient, records     ANESTHESIA HISTORY  Pertinent(-):  No History of anesthetic complications    GENERAL    + Obesity          central, upper body     PULMONARY    + Asthma    + Snoring  Pertinent(-):  No smoking        GI/HEPATIC/RENAL  Last PO Intake: >8hr before procedure and >2hr before procedure (clears)    + Alcohol use (10 drinks per week.)          1 drink/day  Pertinent(-):  No GERD NEURO/PSYCH    + Psychiatric Issues          anxiety                 Physical Exam    Airway            Mouth opening: normal            Mallampati: III            TM distance (fb): <3 FB            Neck ROM: full  Dental   Normal Exam   Cardiovascular           Rhythm: regular           Rate: normal         Pulmonary     breath sounds clear to auscultation    No cough, wheezes    Mental Status     oriented to person, place and time         ________________________________________________________________________  PLAN  ASA Score  2  Anesthetic Plan general     Induction (routine IV) General Anesthesia/Sedation Maintenance Plan (inhaled agents); Airway (LMA); Line ( use current access); Monitoring (standard ASA); Positioning (supine); PONV Plan (ondansetron and dexamethasone); Pain (per surgical team); PostOp (PACU)    Informed Consent     Risks:         Risks discussed were commensurate with the plan listed above with the following specific points: N/V, aspiration, sore throat, hypotension, dizziness and fatigue, Damage to: eyes and teeth, allergic Rx.    Anesthetic Consent:         Anesthetic plan (and risks as noted above) were discussed with patient     Plan also discussed with team members including:       CRNA    Attending Attestation:  As the primary attending anesthesiologist, I attest that the patient or proxy understands and accepts the risks and benefits of the anesthesia plan. I also attest that I have personally performed a pre-anesthetic examination and evaluation, and prescribed the anesthetic plan for this particular location within 48 hours prior to the anesthetic as documented. Ned ClinesSakina Delcenia Inman, MD 2:42 PM

## 2018-04-03 ENCOUNTER — Encounter
Admission: RE | Disposition: A | Discharge status: Home / Self Care | Payer: Self-pay | Source: Ambulatory Visit | Attending: Orthopedic Surgery

## 2018-04-03 ENCOUNTER — Encounter: Payer: Self-pay | Admitting: Pain Medicine

## 2018-04-03 ENCOUNTER — Other Ambulatory Visit: Payer: Self-pay

## 2018-04-03 ENCOUNTER — Encounter: Payer: Medicaid Other | Admitting: Pain Medicine

## 2018-04-03 ENCOUNTER — Ambulatory Visit
Admission: RE | Admit: 2018-04-03 | Discharge: 2018-04-03 | Disposition: A | Discharge status: Home / Self Care | Payer: Medicaid Other | Source: Ambulatory Visit | Attending: Orthopedic Surgery | Admitting: Orthopedic Surgery

## 2018-04-03 DIAGNOSIS — J45909 Unspecified asthma, uncomplicated: Secondary | ICD-10-CM | POA: Insufficient documentation

## 2018-04-03 DIAGNOSIS — Y929 Unspecified place or not applicable: Secondary | ICD-10-CM | POA: Insufficient documentation

## 2018-04-03 DIAGNOSIS — S83241A Other tear of medial meniscus, current injury, right knee, initial encounter: Secondary | ICD-10-CM

## 2018-04-03 DIAGNOSIS — Y939 Activity, unspecified: Secondary | ICD-10-CM | POA: Insufficient documentation

## 2018-04-03 DIAGNOSIS — Y998 Other external cause status: Secondary | ICD-10-CM | POA: Insufficient documentation

## 2018-04-03 DIAGNOSIS — M2241 Chondromalacia patellae, right knee: Secondary | ICD-10-CM | POA: Insufficient documentation

## 2018-04-03 DIAGNOSIS — X58XXXA Exposure to other specified factors, initial encounter: Secondary | ICD-10-CM | POA: Insufficient documentation

## 2018-04-03 DIAGNOSIS — Z91040 Latex allergy status: Secondary | ICD-10-CM | POA: Insufficient documentation

## 2018-04-03 HISTORY — PX: PR ARTHRS KNE SURG W/MENISCECTOMY MED/LAT W/SHVG: 29881

## 2018-04-03 LAB — POCT URINE PREGNANCY
Lot #: 1910901
Preg Test,UR POC: NEGATIVE

## 2018-04-03 SURGERY — ARTHROSCOPY, KNEE, WITH MENISCECTOMY
Anesthesia: General | Site: Knee | Laterality: Right | Wound class: Clean

## 2018-04-03 MED ORDER — LIDOCAINE HCL (PF) 1 % IJ SOLN *I*
INTRAMUSCULAR | Status: AC
Start: 2018-04-03 — End: 2018-04-03
  Filled 2018-04-03: qty 2

## 2018-04-03 MED ORDER — FENTANYL CITRATE 50 MCG/ML IJ SOLN *WRAPPED*
INTRAMUSCULAR | Status: DC | PRN
Start: 2018-04-03 — End: 2018-04-03
  Administered 2018-04-03: 100 ug via INTRAVENOUS

## 2018-04-03 MED ORDER — LIDOCAINE HCL (PF) 1 % IJ SOLN *I*
0.1000 mL | Freq: Once | INTRAMUSCULAR | Status: DC | PRN
Start: 2018-04-03 — End: 2018-04-04
  Administered 2018-04-03: 0.1 mL via SUBCUTANEOUS

## 2018-04-03 MED ORDER — CEFAZOLIN 1000 MG IN STERILE WATER 10ML SYRINGE *I*
2000.0000 mg | PREFILLED_SYRINGE | Freq: Once | INTRAVENOUS | Status: AC
Start: 2018-04-03 — End: 2018-04-03
  Administered 2018-04-03: 2000 mg via INTRAVENOUS

## 2018-04-03 MED ORDER — PROPOFOL 10 MG/ML IV EMUL (INTERMITTENT DOSING) WRAPPED *I*
INTRAVENOUS | Status: DC | PRN
Start: 2018-04-03 — End: 2018-04-03
  Administered 2018-04-03: 200 mg via INTRAVENOUS

## 2018-04-03 MED ORDER — OXYCODONE HCL 5 MG/5ML PO SOLN *I*
5.0000 mg | Freq: Once | ORAL | Status: AC | PRN
Start: 2018-04-03 — End: 2018-04-03

## 2018-04-03 MED ORDER — MIDAZOLAM HCL 1 MG/ML IJ SOLN *I* WRAPPED
INTRAMUSCULAR | Status: AC
Start: 2018-04-03 — End: 2018-04-03
  Filled 2018-04-03: qty 2

## 2018-04-03 MED ORDER — OXYCODONE HCL 5 MG/5ML PO SOLN *I*
ORAL | Status: AC
Start: 2018-04-03 — End: 2018-04-03
  Filled 2018-04-03: qty 10

## 2018-04-03 MED ORDER — LACTATED RINGERS IV SOLN *I*
20.0000 mL/h | INTRAVENOUS | Status: DC
Start: 2018-04-03 — End: 2018-04-04
  Administered 2018-04-03: 20 mL/h via INTRAVENOUS

## 2018-04-03 MED ORDER — HYDROMORPHONE HCL PF 1 MG/ML IJ SOLN *WRAPPED*
0.5000 mg | INTRAMUSCULAR | Status: DC | PRN
Start: 2018-04-03 — End: 2018-04-04

## 2018-04-03 MED ORDER — DEXAMETHASONE SODIUM PHOSPHATE 4 MG/ML INJ SOLN *WRAPPED*
INTRAMUSCULAR | Status: DC | PRN
Start: 2018-04-03 — End: 2018-04-03
  Administered 2018-04-03: 4 mg via INTRAVENOUS

## 2018-04-03 MED ORDER — LIDOCAINE HCL 2 % IJ SOLN *I*
INTRAMUSCULAR | Status: DC | PRN
Start: 2018-04-03 — End: 2018-04-03
  Administered 2018-04-03: 100 mg via INTRAVENOUS

## 2018-04-03 MED ORDER — KETOROLAC TROMETHAMINE 15 MG/ML IJ SOLN *I*
INTRAMUSCULAR | Status: DC | PRN
Start: 2018-04-03 — End: 2018-04-03
  Administered 2018-04-03: 30 mg via INTRAVENOUS

## 2018-04-03 MED ORDER — HALOPERIDOL LACTATE 5 MG/ML IJ SOLN *I*
1.0000 mg | Freq: Once | INTRAMUSCULAR | Status: AC | PRN
Start: 2018-04-03 — End: 2018-04-03

## 2018-04-03 MED ORDER — OXYCODONE HCL 5 MG/5ML PO SOLN *I*
10.0000 mg | Freq: Once | ORAL | Status: AC | PRN
Start: 2018-04-03 — End: 2018-04-03
  Administered 2018-04-03: 10 mg via ORAL

## 2018-04-03 MED ORDER — MIDAZOLAM HCL 1 MG/ML IJ SOLN *I* WRAPPED
INTRAMUSCULAR | Status: DC | PRN
Start: 2018-04-03 — End: 2018-04-03
  Administered 2018-04-03: 2 mg via INTRAVENOUS

## 2018-04-03 MED ORDER — ONDANSETRON HCL 2 MG/ML IV SOLN *I*
INTRAMUSCULAR | Status: DC | PRN
Start: 2018-04-03 — End: 2018-04-03
  Administered 2018-04-03: 4 mg via INTRAVENOUS

## 2018-04-03 MED ORDER — FENTANYL CITRATE 50 MCG/ML IJ SOLN *WRAPPED*
INTRAMUSCULAR | Status: AC
Start: 2018-04-03 — End: 2018-04-03
  Filled 2018-04-03: qty 2

## 2018-04-03 MED ORDER — CEFAZOLIN 1000 MG IN STERILE WATER 10ML SYRINGE *I*
PREFILLED_SYRINGE | INTRAVENOUS | Status: AC
Start: 2018-04-03 — End: 2018-04-03
  Filled 2018-04-03: qty 20

## 2018-04-03 SURGICAL SUPPLY — 39 items
"BLADE BONE CUTTER 5,5MM X 13CM" (Other)
ARTHOWAND BEVEL 3.0MM X 45 (Other) IMPLANT
BANDAGE ESMARK 6IN X 3YD LF STER (Dressing) IMPLANT
BLADE BONE CUTTER 5 5MM X 13CM (Other) IMPLANT
BLADE DOUBLE CUT 4MM X 13CM (Supply) ×3 IMPLANT
BLADE SURG CARBON STEEL #20 STER (Supply) IMPLANT
BOOT KNEE HIGH NON SKID (Supply) ×6 IMPLANT
BRACE BREG POST-OP KNEE SHT (Supply) IMPLANT
BRACE KNEE TELESCOPING ELS ~~LOC~~ (Supply) IMPLANT
CUFF TOURNIQUET SPSB PLC 34IN STER DISP (Supply) ×3 IMPLANT
CUTTER SUTR KNOT PUSHER SLOTTED CAN SET (Other) IMPLANT
DRAPE SURG IOBAN 2 ANTIMIC MED 13 X 13IN (Drape) ×3 IMPLANT
DRAPE SURG U PREP POLY 48 X 52IN (Drape) ×3 IMPLANT
DRESSING FLUFF SUPER SP 6 X 6.75IN STER (Dressing) ×6 IMPLANT
FILTER NEPTUNE 4PORT MANIFOLD (Supply) ×3 IMPLANT
GLOVE BIOGEL PI MICRO IND UNDER SZ 8.5 LF (Glove) ×3 IMPLANT
GLOVE BIOGEL PI MICRO SZ 8 (Glove) ×17 IMPLANT
GLOVE SURG BIOGEL PI ULTRATOUCH SZ 8.5 (Glove) ×6 IMPLANT
GLOVE SURG DURAPRENE PLUS SZ7.5 PF (Glove) IMPLANT
GOWN SIRIUS RAGLAN NONREINFORCED XL (Gown) ×3 IMPLANT
KIT BIOCARTILAGE ANKLE AND SHOULDER (Other) IMPLANT
KIT TISSEEL FIBRIN SEALANT 4ML USE 194782 (Other) IMPLANT
PACK CUSTOM ARTHROSCOPY KNEE CDS (Pack) ×3 IMPLANT
PADDING WEBRIL 4IN LF NONSTER (Dressing) ×6 IMPLANT
SOL LACT RINGER IRRIG 3000ML BAG (Drug) ×6 IMPLANT
SOL PVP TOPICAL PREP 4OZ (Solution) ×3 IMPLANT
SOL SCRUB POVIDONE IODINE 4OZ (Solution) ×3 IMPLANT
SOL SOD CHL IRRIG 500ML BTL (Solution) IMPLANT
SPONGE K DISSECTOR (Sponge)
SPONGE SUR W0.25XL9/16IN WHT COT RND KTNR DISECT RADPQ DISP (Sponge) IMPLANT
STOCKING ANTI-EM THIGH LG REG (Supply) ×3 IMPLANT
STOCKING ANTI-EM THIGH MED REG (Supply) IMPLANT
SUTR MONOCRYL PLUS 3-0PS2 27IN UNDY (Suture) ×3 IMPLANT
SYRINGE IRRIG BULB 50CC (Syringe) IMPLANT
SYSTEM PLTLT CONC DBL SYR W/ CAP FOR AUTOLGS CONDITIONED PLSM SYS (Supply) IMPLANT
SYSTEM SYR DOUBLE (Supply)
TIP SUCT FRAZIER 12FR (Supply) IMPLANT
TUBING ARTHRSC PUMP DISP INFLOW (Tubing) ×3 IMPLANT
WRAP GAUZE 6IN STERILE (Dressing) ×3 IMPLANT

## 2018-04-03 NOTE — Op Note (Signed)
Loretta [name]:   Loretta [name], Loretta [name] MR #:  [id number]   CSN:  [id number] DOB:  1965/05/27    AGE:  52     SURGEON:  Lucy Antiguaobert D Tage Feggins, MD  CO-SURGEON:    ASSISTANT:  Dr. Willey BladeLinda Zhang.  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    PROCEDURE:  Arthroscopy of the right knee with patellar chondroplasty and partial medial meniscectomy.    ANESTHESIA:  General with laryngeal mask airway.    FINDINGS:  Grade 3 chondromalacia of the patella with posterior horn medial meniscus tear.    ESTIMATED BLOOD LOSS:  Minimal.    TOURNIQUET TIME:  None.    COMPLICATIONS:  None.    DISPOSITION:  Following the procedure, patient was brought to the postanesthesia care unit in stable condition.    DESCRIPTION OF PROCEDURE:  The patient was brought to the operating room, placed on the operating room table in supine position.  Following the induction of general anesthetic, a well-padded tourniquet was placed on the patient's right upper thigh, was not inflated at this time.  The patient's right knee was examined under anesthesia.  It was noted to be stable to varus and valgus stress, with a negative Lachman examination and negative posterior drawer.  The patient's right knee and lower extremity are then prepped and draped in the usual sterile fashion.  A time-out was performed to verify the proper patient, extremity, and procedure.     Standard arthroscopic portals are then made with a #11 blade, with an anterolateral portal approximately 0.5 cm lateral to the patellar tendon and 1 cm proximal to the tibial plateau.  A similar superomedial outflow portal was made.  With the arthroscope in the anterolateral portal, a systematic examination of the knee is begun.     Beginning in the suprapatellar pouch, the undersurface of patella did have grade 3 chondromalacia with loose articular  cartilage fragmentation.  An anteromedial portal was made, and with a power shaver from the anteromedial and superomedial compartments, a chondroplasty of the patella was performed to debride back the loose articular cartilage fragmented area back to stable articular cartilage.     Continuing the arthroscopy to the medial compartment of the knee, the medial femoral condyle is within normal limits.  There was grade 1 chondromalacia of the medial tibial plateau.  The medial meniscus was fully probed and examined.  There was noted to be a tear of the posterior horn of the medial meniscus with horizontal and oblique components.  Using a combination of hand and power instruments, the torn portion of the medial meniscus is removed and debrided back to stable meniscus border.     Continuing the arthroscopy to the region of the notch, the anterior and posterior cruciate ligaments are visualized and noted to be normal.  Continuing to the lateral compartment of the knee, the lateral femoral condyle and lateral tibial plateau are within normal limits.  The lateral meniscus was fully probed and examined, noted to be without any tears or instability.     The arthroscope is then removed from the knee.  The knee was thoroughly drained of fluid.  Each arthroscopic portal was reapproximated with single subcuticular sutures of 3-0 Monocryl.  Sterile dressings are applied and a TED stocking is applied.  The patient is awakened from general anesthesia without incident, having tolerated the procedure  well, was then transferred to the postanesthesia care unit in stable condition.    OPERATIVE PROCEDURE:               ______________________________  Lucy Antigua, MD    RDB/MODL  DD:  04/03/2018 19:07:48  DT:  04/03/2018 19:25:37  Job #:  [id number]    cc:  Gaspar Skeeters, DO   [address]   [place], [place] [address]

## 2018-04-03 NOTE — INTERIM OP NOTE (Signed)
Interim Op Note (Surgical Log ID: [id number])       Date of Surgery: 04/03/2018       Surgeons: Surgeon(s) and Role:     * Roselie AwkwardBronstein, Robert, MD - Primary     * Chipper HerbZhang, Tiffany KocherLinda Lee, MD - Resident - Assisting       Pre-op Diagnosis: Pre-Op Diagnosis Codes:     * Acute medial meniscus tear of right knee [S83.241A]       Post-op Diagnosis: Post-Op Diagnosis Codes:     * Acute medial meniscus tear of right knee [S83.241A]       Procedure(s) Performed: Procedure(s) (LRB):  ARTHROSCOPY, KNEE, WITH MENISCECTOMY (Right)       Anesthesia Type: General        Fluid Totals: I/O this shift:  12/10 1500 - 12/10 2259  In: 600 (5.9 mL/kg) [I.V.:600]  Out: 5 (0 mL/kg) [Blood:5]  Net: 595  Weight: 102.5 kg        Estimated Blood Loss: 5 mL       Specimens to Pathology:  * No specimens in log *       Temporary Implants:        Packing:                 Patient Condition: good       Findings (Including unexpected complications): See op note     Signed:  Nettie ElmLinda Lee Aniello Christopoulos, MD  on 04/03/2018 at 3:59 PM

## 2018-04-03 NOTE — Anesthesia Case Conclusion (Signed)
CASE CONCLUSION  Emergence  Actions:  LMA removed  Criteria Used for Airway Removal:  Adequate Tv & RR and acceptable O2 saturation  Assessment:  Routine  Transport  Directly to: PACU  Airway:  Nasal cannula  Oxygen Delivery:  2 lpm  Position:  Recumbent  Patient Condition on Handoff  Level of Consciousness:  Moderately sedated  Patient Condition:  Stable  Handoff Report to:  RN

## 2018-04-03 NOTE — Anesthesia Postprocedure Evaluation (Signed)
Anesthesia Post-Op Note    Patient: Loretta [name]    Procedure(s) Performed:  Procedure Summary  Date:  04/03/2018 Anesthesia Start: 04/03/2018  3:16 PM Anesthesia Stop: 04/03/2018  3:59 PM [id number]   Procedure(s):  ARTHROSCOPY, KNEE, WITH MENISCECTOMY Diagnosis:  Acute medial meniscus tear of right knee [S83.241A] Surgeon(s):  Roselie AwkwardBronstein, Robert, MD  Nettie ElmZhang, Linda Lee, MD Attending Anesthesiologist:  Ned ClinesNAYAZ, SAKINA, MD         Recovery Vitals  BP: 135/71 (04/03/2018  5:30 PM)  Heart Rate: 83 (04/03/2018  5:30 PM)  Heart Rate (via Pulse Ox): 77 (04/03/2018  5:30 PM)  Resp: 15 (04/03/2018  5:30 PM)  Temp: 36 C (96.8 F) (04/03/2018  3:58 PM)  SpO2: 94 % (04/03/2018  5:30 PM)  O2 Flow Rate: 2 L/min (04/03/2018  3:58 PM)   0-10 Scale: 4 ("tolerable") (04/03/2018  5:30 PM)    Anesthesia type:  general  Complications Noted During Procedure or in PACU:  None   Comment:    Patient Location:  PACU  Level of Consciousness:    Recovered to baseline  Patient Participation:     Able to participate  Temperature Status:    Normothermic  Oxygen Saturation:    Within patient's normal range  Cardiac Status:   Within patient's normal range  Fluid Status:    Stable  Airway Patency:     Yes  Pulmonary Status:    Baseline  Pain Management:    Adequate analgesia  Nausea and Vomiting:  None    Post Op Assessment:    Tolerated procedure well and no evidence of recall"   Attending Attestation:  All indicated post anesthesia care provided       -

## 2018-04-03 NOTE — H&P (Signed)
UPDATES TO PATIENT'S CONDITION on the DAY OF SURGERY/PROCEDURE    I. Updates to Patient's Condition (to be completed by a provider privileged to complete a H&P, following reassessment of the patient by the provider):    Day of Surgery/Procedure Update:  History  History reviewed and no change    Physical  Physical exam updated and no change              II. Procedure Readiness   I have reviewed the patient's H&P and updated condition. By completing and signing this form, I attest that this patient is ready for surgery/procedure.      III. Attestation   I have reviewed the updated information regarding the patient's condition and it is appropriate to proceed with the planned surgery/procedure.    Aniqa Hare, MD

## 2018-04-03 NOTE — Anesthesia Procedure Notes (Signed)
---------------------------------------------------------------------------------------------------------------------------------------    AIRWAY   GENERAL INFORMATION AND STAFF    Patient location during procedure: OR  CONDITION PRIOR TO MANIPULATION     Current Airway/Neck Condition:  Normal        For more airway physical exam details, see Anesthesia PreOp Evaluation  AIRWAY METHOD     Preoxygenated: yes      Induction: IV  Mask Difficulty Assessment:  0 - not attempted    Number of Attempts at Approach:  1  FINAL AIRWAY DETAILS    Final Airway Type:  LMA    Adjunct Airway: soft bite block    Final LMA: Unique    LMA Size: 4  ----------------------------------------------------------------------------------------------------------------------------------------

## 2018-04-03 NOTE — Discharge Instructions (Signed)
DISCHARGE INSTRUCTIONS  Arthroscopic Knee Surgery  Dr. Molly Maduroobert. Loretta Amsler. Bronstein  Phone - [phone]  [facility]   [address] [place], [place] [id number]     1. You may begin to bear weight on the operated leg immediately after your anesthesia has worn off.  You should use crutches as necessary to prevent you from limping.  As your leg feels better, put more weight on the leg and less on the crutches.  When you can walk without a limp not using the crutches, you don't need to use them anymore.  Don't get rid of them early -- wait until you can walk comfortably without them.       2. Besides walking, there are three specific exercises you will need to do:   THESE SHOULD EACH BE DONE TEN TIMES EACH HOUR WHILE AWAKE   A. Quad sets - tightening the front of the thigh for the count of 5 and releasing.   B. Straight leg lifts - lift your leg while keeping your knee straight. Smooth up and down.   C. Knee motion - lift the leg straight and bend and straighten the knee while the leg is elevated, or bend and straighten the knee while sitting.    3. Keep dressings and elastic stocking on for 3 days, then keep the knee area clean and dry for five days. If the wounds are clean and dry, a shower (not a bath) may be taken, and the wounds cleaned with alcohol.  After cleaning the wound, apply a band-aid.  If the wound is draining at all, do not shower, but apply a gauze dressing and your elastic stocking.  This dressing should be worn at all times until the wound is dry, then proceed with showers, cleaning with the alcohol, band-aid.    4.       Elevate your leg to help decrease swelling and frequently ice 20 minutes on, 20 minutes off.     5. You will receive a prescription for a mild pain medicine - you may not need to have this prescription filled. However, you may be instructed to take other medications (anti-inflammatories).     6. You may begin to drive:   LEFT LEG OPERATED - 24 hours after your surgery if you have  an automatic transmission   RIGHT LEG OPERATED - as soon as you have comfortable control of your leg.   LEFT LEG OPERATED WITH STANDARD TRANSMISSION - as soon as you have comfortable control of your leg.    7. You will be told to see your surgeon in about 2 weeks.  Appointments can be made by calling the appointment numbers: [phone].    IF ANY OF THE FOLLOWING PERSIST, NOTIFY YOUR SURGEON:   1. Pain that increases in intensity   2. Elevated temperature not associated with any other illness                  About your medications from today    [x]  Prescription information provided from onsite pharmacy.   []  Prescription information given to patient and/or patient representative, prescription not filled at onsite pharmacy.      Your last pain medication was given to you at: Toradol (Anti-inflammatory) at 3:30 pm and Oxycodone 10 mg at 4:12 pm.    Your next dose of pain medication is due after: Ibuprofen at 9:30 pm and Hydrocodone at 10:12 pm.

## 2018-04-04 ENCOUNTER — Encounter: Payer: Self-pay | Admitting: Orthopedic Surgery

## 2018-04-06 ENCOUNTER — Other Ambulatory Visit: Payer: Self-pay

## 2018-04-09 ENCOUNTER — Other Ambulatory Visit: Payer: Self-pay

## 2018-04-13 ENCOUNTER — Encounter: Payer: Self-pay | Admitting: Orthopedic Surgery

## 2018-04-13 ENCOUNTER — Ambulatory Visit: Payer: Medicaid Other | Attending: Orthopedic Surgery | Admitting: Orthopedic Surgery

## 2018-04-13 VITALS — BP 124/70 | Ht 63.5 in | Wt 226.0 lb

## 2018-04-13 DIAGNOSIS — M2241 Chondromalacia patellae, right knee: Secondary | ICD-10-CM

## 2018-04-13 DIAGNOSIS — S838X1A Sprain of other specified parts of right knee, initial encounter: Secondary | ICD-10-CM

## 2018-04-13 NOTE — Progress Notes (Signed)
She is 10 days status post right knee arthroscopy with chondroplasty the patella and partial medial meniscectomy.  He does have soreness in the but overall feels she is doing well.    Physical examination right knee: The incisions are healing with no erythema or discharge.  Range of motion 0-110.  Quadricep controls good and she is able to straight leg raise without an extensor lag.  Calf is soft and nontender.    Impression: Status post right knee arthroscopy with patellar chondroplasty and partial medial meniscectomy.    Plan: I reviewed with her a gradual functional progression as tolerated.  I will check her back in another 4 weeks.    This note was transcribed using voice recognition software.  Please excuse irregularities that may result.   .Marland Kitchen

## 2018-04-16 ENCOUNTER — Other Ambulatory Visit: Payer: Self-pay

## 2018-04-17 ENCOUNTER — Other Ambulatory Visit: Payer: Self-pay

## 2018-05-01 ENCOUNTER — Other Ambulatory Visit: Payer: Self-pay

## 2018-05-15 DIAGNOSIS — N926 Irregular menstruation, unspecified: Secondary | ICD-10-CM | POA: Insufficient documentation

## 2018-05-16 ENCOUNTER — Ambulatory Visit: Payer: Medicaid Other | Admitting: Orthopedic Surgery

## 2018-11-04 ENCOUNTER — Emergency Department
Admission: EM | Admit: 2018-11-04 | Discharge: 2018-11-04 | Disposition: A | Discharge status: Home / Self Care | Payer: Medicaid Other | Source: Ambulatory Visit | Attending: Emergency Medicine | Admitting: Emergency Medicine

## 2018-11-04 ENCOUNTER — Emergency Department: Payer: Medicaid Other | Admitting: Radiology

## 2018-11-04 DIAGNOSIS — M25474 Effusion, right foot: Secondary | ICD-10-CM

## 2018-11-04 DIAGNOSIS — S9030XA Contusion of unspecified foot, initial encounter: Secondary | ICD-10-CM

## 2018-11-04 DIAGNOSIS — Y998 Other external cause status: Secondary | ICD-10-CM | POA: Insufficient documentation

## 2018-11-04 DIAGNOSIS — S99921A Unspecified injury of right foot, initial encounter: Secondary | ICD-10-CM

## 2018-11-04 DIAGNOSIS — Y9389 Activity, other specified: Secondary | ICD-10-CM | POA: Insufficient documentation

## 2018-11-04 DIAGNOSIS — S90811A Abrasion, right foot, initial encounter: Secondary | ICD-10-CM

## 2018-11-04 DIAGNOSIS — W208XXA Other cause of strike by thrown, projected or falling object, initial encounter: Secondary | ICD-10-CM | POA: Insufficient documentation

## 2018-11-04 DIAGNOSIS — Y9289 Other specified places as the place of occurrence of the external cause: Secondary | ICD-10-CM | POA: Insufficient documentation

## 2018-11-04 DIAGNOSIS — S9031XA Contusion of right foot, initial encounter: Secondary | ICD-10-CM | POA: Insufficient documentation

## 2018-11-04 MED ORDER — ACETAMINOPHEN 325 MG PO TABS *I*
1000.0000 mg | ORAL_TABLET | Freq: Once | ORAL | Status: AC
Start: 2018-11-04 — End: 2018-11-04
  Administered 2018-11-04: 975 mg via ORAL
  Filled 2018-11-04: qty 3

## 2018-11-04 NOTE — ED Provider Notes (Signed)
History     Chief Complaint   Patient presents with    Foot Injury     Patient with PMH of anxiety,seasonal allergies, and obesity who had large piece of lumbar fell onto top of Right foot today, [facility] with concern for fracture.  Patient reports she is ambulatory, but top of foot is very painful.  Patient reprots Td ap is UTD.        Medical/Surgical/Family History     Past Medical History:   Diagnosis Date    Anxiety         Patient Active Problem List   Diagnosis Code    Seasonal allergic rhinitis J30.2            Past Surgical History:   Procedure Laterality Date    APPENDECTOMY      CHOLECYSTECTOMY      PR ARTHRS KNE SURG W/MENISCECTOMY MED/LAT W/SHVG Right 04/03/2018    Procedure: ARTHROSCOPY, KNEE, WITH MENISCECTOMY;  Surgeon: Roselie AwkwardBronstein, Robert, MD;  Location: [facility];  Service: Orthopedics    TUBAL LIGATION       Family History   Problem Relation Age of Onset    High Blood Pressure Mother     Cataracts Mother     Depression Mother     Arthritis Mother     Diabetes Mother     Neuromuscular disorder Mother     Neuromuscular disorder Sister     Arthritis Sister     Depression Sister     Diabetes Sister     High Blood Pressure Sister           Social History     Tobacco Use    Smoking status: Never Smoker    Smokeless tobacco: Never Used   Substance Use Topics    Alcohol use: Yes    Drug use: Not on file     Living Situation     Questions Responses    Patient lives with Family    Homeless No    Caregiver for other family member No    External Services None    Employment Employed    Domestic Violence Risk No           Review of Systems   Review of Systems   Constitutional: Positive for activity change. Negative for appetite change, chills, diaphoresis, fatigue, fever and unexpected weight change.        Right foot injury, able to weight bear, painful   HENT: Negative.  Negative for congestion, ear discharge, ear pain, hearing loss, postnasal drip, rhinorrhea,  sneezing, sore throat and trouble swallowing.    Eyes: Negative.  Negative for photophobia, pain, discharge, redness, itching and visual disturbance.   Respiratory: Negative.    Cardiovascular: Negative.    Gastrointestinal: Negative for anal bleeding, blood in stool and rectal pain.   Endocrine: Negative for cold intolerance and heat intolerance.   Musculoskeletal: Positive for arthralgias. Negative for back pain, gait problem, joint swelling, myalgias, neck pain and neck stiffness.        Injury to top of Right foot, abrasion, contusion visible   Skin: Positive for color change and wound. Negative for pallor and rash.        Abrasion, contusion to dorsal Right foot   Allergic/Immunologic: Negative.  Negative for environmental allergies, food allergies and immunocompromised state.   Neurological: Negative.  Negative for dizziness, tremors, seizures, syncope, facial asymmetry, speech difficulty, weakness, light-headedness, numbness and headaches.   Hematological: Negative.  Negative for adenopathy. Does not bruise/bleed easily.   Psychiatric/Behavioral: Negative.      Physical Exam     Triage Vitals  Triage Start: Start, (11/04/18 1714)   First Recorded BP: 139/75, Resp: 20, Temp: 36.9 C (98.4 F), Temp src: TEMPORAL Oxygen Therapy SpO2: 97 %, O2 Device: None (Room air), Heart Rate: 98, (11/04/18 1718)  .  First Pain Reported  0-10 Scale: 6(at rest), Pain Location/Orientation: Foot Right, (11/04/18 1718)     Physical Exam  Vitals signs and nursing note reviewed.   Constitutional:       General: She is not in acute distress.     Appearance: She is well-developed. She is obese. She is not ill-appearing, toxic-appearing or diaphoretic.      Comments: Right foot with dorsal swelling, tenderness, and small linear abrasion   HENT:      Right Ear: External ear normal.      Left Ear: External ear normal.   Eyes:      Conjunctiva/sclera: Conjunctivae normal.      Pupils: Pupils are equal, round, and reactive to light.    Neck:      Musculoskeletal: Normal range of motion and neck supple.   Cardiovascular:      Rate and Rhythm: Normal rate and regular rhythm.      Pulses: Normal pulses.   Pulmonary:      Effort: Pulmonary effort is normal. No respiratory distress.   Musculoskeletal: Normal range of motion.         General: Swelling, tenderness, deformity and signs of injury present.      Right lower leg: No edema.      Left lower leg: No edema.      Right foot: Normal range of motion and normal capillary refill. Tenderness, bony tenderness and swelling present. No crepitus, deformity or laceration.        Feet:       Comments: Achilles intact, no posterior tenderness  2+ DP/PT pulses  Full ROM , normal sensation to light touch, and brisk capillary refill all toes   Skin:     General: Skin is warm and dry.      Capillary Refill: Capillary refill takes less than 2 seconds.      Coloration: Skin is not jaundiced or pale.      Findings: No bruising, erythema, lesion or rash.   Neurological:      General: No focal deficit present.      Mental Status: She is alert and oriented to person, place, and time. Mental status is at baseline.      Cranial Nerves: No cranial nerve deficit.      Sensory: No sensory deficit.   Psychiatric:         Mood and Affect: Mood normal.         Behavior: Behavior normal.         Thought Content: Thought content normal.         Judgment: Judgment normal.       Medical Decision Making   Patient seen by me on:  11/04/2018    Assessment:  Large board onto top of Right foot, swelling, bruising  Patient reports Td ap is UTD  VSS      Differential diagnosis:  Fracture  Contusion  Abrasion    Plan:  RICE  Exam  X-ray Right foot  Analgesia offered  Abrasion cleaned    Independent review of: XRays, chart/prior records    ED Course and Disposition:  X-ray Right foot: No acute fracture or dislocation involving the right foot.  Abrasion/contusion dorsal Right foot  Post-op shoe  RICE  Polysporin and daily cleaning to  abrasion  PCP follow-up as needed            Hollins, NP          Pattricia Boss, NP  11/07/18 1216

## 2018-11-04 NOTE — ED Triage Notes (Signed)
Pt states 2X6 wood board fell onto top of right foot from about 4 feet.  Injury 40 minute prior to visit. (+) bruising and linear cut noted top of foot. No bleeding. Using ice. No medications for pain.       Triage Note   Orpha Bur, RN

## 2018-11-04 NOTE — Discharge Instructions (Addendum)
Drink plenty of fluids, water, juices, sport drinks    ELEVATE your extremity ABOVE YOUR HEART  As much as possible for the next 7-10 days, and when sore/aching    Apply ICE/COLD PACK to area of soreness/swelling for 10-15 minutes, 3-5 times per day, and when sore/aching --- avoid toes*    Follow-up with regular MD as needed    Tylenol ---use as directed, if needed for pain    Motrin 600 mg tablets --- take 1(one) tablet by mouth every 6-8 hours as needed for more severe pain - take with food/meal*

## 2018-11-04 NOTE — ED Notes (Signed)
Plan of Care     Nursing Care Plan: Will monitor and assess VS and pain scores every 2-4 hours and prn.Perform frequent rounding prn.Provide updates to patient and/or caregivers frequently. Provide support to patient/caregiver as needed.Teach patient and /or caregivers about patients needs/status working toward discharge. Patient oriented to room and given call bell.

## 2018-11-04 NOTE — ED Notes (Signed)
Pt was cutting wood with a saw and a 2 X 4 she was working with fell 4 feet onto the top of her right foot, pt has what appears to be a scratch about 2.5 inch long to top of her foot, pt dorsum of her right foot appears swollen and bruised, painful to touch, pt unable to bear weight on foot d/t pain.pt to X-ray.

## 2018-11-07 DIAGNOSIS — N950 Postmenopausal bleeding: Secondary | ICD-10-CM | POA: Insufficient documentation

## 2019-03-27 ENCOUNTER — Encounter: Payer: Self-pay | Admitting: Orthopedic Surgery

## 2019-03-27 ENCOUNTER — Ambulatory Visit: Payer: Medicaid Other | Admitting: Orthopedic Surgery

## 2019-03-27 VITALS — BP 159/70 | HR 92 | Ht 63.5 in | Wt 250.0 lb

## 2019-03-27 DIAGNOSIS — M2241 Chondromalacia patellae, right knee: Secondary | ICD-10-CM

## 2019-03-27 NOTE — Progress Notes (Signed)
He presents today for evaluation of pain she's having in her right knee.  She did have right knee arthroscopy with partial medial meniscectomy 1 year ago.  At that time she was noted to have grade 3 chondral changes of the patella.  She was doing fine until about 6 weeks ago when she started getting anterior pain in the right knee.  She has no new injury that occurred.    Physical examination right knee: BMI equals 43.59.  She has no knee effusion.  She has full range of motion the knee.  She has medial patellar facet tenderness with no lateral patellar facet tenderness and negative apprehension sign.  She has medial joint line tenderness with no lateral joint line tenderness.  The knee is stable to varus and valgus stress with a negative Lachman examination negative posterior drawer.  On circumduction maneuvers with medial circumduction she does get anteromedial pain.  No actual trapping.  No lateral symptoms with circumduction.    Impression: Right knee patellar chondromalacia seen on arthroscopy last year.  Recurrent meniscal tear is very unlikely with no effusion.    Plan: We will start on her formal rehabilitation program.  I will check her back in about 6 weeks.  Questions are answered and a large portion of today's visit was spent education and counseling the patient..    This note was transcribed using voice recognition software.  Please excuse irregularities that may result.

## 2019-04-03 NOTE — Progress Notes (Signed)
[facility] REHABILITATION LE EVALUATION      Diagnosis: Right Patellar Chondromalacia  Onset date:  1 month  Date of surgery: 04/04/2019    History    Loretta [name] is a 53 y.o. female who is present today for right knee care.   Mechanism of injury/history of symptoms:  Twisting her knee.    Patient had been having return of right knee pain about a month ago. She had a right medial meniscectomy 1 year ago today. She did independent exercises at home after and had a very good recovery. Pateint states she was able to return to work and do yard work without a problem. The only activity that she has not been able to do since surgery is stand up from the floor without assistance. She was able to climb the bus stairs with alternating pattern up until a month ago. Her pain started to return and it was waking her up at night. One week ago she tweaked it by twisting it in a swivel chair at the hair salon. Since then the pain is quite severe and she feels it is similar to her knee before surgery last year.     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   Stresses/physical demands of home: Self Care, Housekeeping and Gardening/Yard Work  Sport(s): NONE  Other: Since surgery she can not get up off the floor without holding onto something.   Diagnostic tests: None    Symptom location: Anterior, right knee  Relevant symptoms:  Sore with weight bearing, stabbing at times  Symptom frequency: Constant  Symptom intensity:  (0 - 10 scale): Now 3 Best 3 Worst 10   Night Pain: Yes   Restful sleep:   No  Morning Pain/Stiffness: Increased   Symptoms worsen with: Stairs, Standing, Walking, Bending, Transfers  Symptoms improve with: Ice  Assistive device:  none  Patients goals for therapy: Perform ambulation at safe level with or without assistive device to promote safe community activity/ social function (shopping,  attendance at appointments/events), Reduce pain, Increase strength     Objective    Observation: Right Quad atrophy   Gait: Mildly Antalgic  (patient states it is worse at times)  Lumbar Screen:  Not Tested  Neurologic:  Sensation:  LE  Intact to gross screen    Palpation:  tenderness anterior aspect of medial joint line  Incision:  NA      ROM/Strength      HIP STRENGTH    Left Right   Flexion 5 3   Extension 5 3   Abduction 5 3         KNEE LEFT RIGHT STRENGTH    PROM AROM PROM AROM Left Right   Flexion  127  115 4+ 5   Extension  2 HE  2 HE 4+ 5                     LE Flexibility  Hamstring:  Fair  Gastroc-soleus:  Fair      Special Tests:   Hip NA   Knee Lachman,  negative  McMurray,  positive   Ankle NA          Functional:  Walk more than 30 minutes - unable to perform.  Ascend stairs with reciprocal gait - unable to perform.  Descend stairs with reciprocal gait - unable to perform.  Assessment:    Findings consistent with 53 y.o. female with Right Patella Chondromalacia with pain, ROM limitations, strength limitations, functional limitations. Patient has significant right knee pain along the medial joint line with weight bearing. She has good quad strength, but pain with TKE and end range of motion. There are concerns for re-injury to the medial meniscus. Patient would benefit from hip and knee strengthening, ROM and balance activities to maximize her functional mobility.     Personal factors affecting treatment/recovery:   none identified  Comorbidities affecting treatment/recovery:   Obesity   right knee arthroscopy with partial medial meniscectomy 1 year ago  Clinical presentation:   stable  Patient complexity:     low level as indicated by above stability of condition, personal factors, environmental factors and comorbidities in addition to patient symptom presentation and impairments found on physical exam.    Prognosis:  Good   Contraindications/Precautions/Limitation:  Per diagnosis  Short  Term Goals: (1 month(s)): Increase ROM right knee 0-125, Increase strength right knee and hip 5/5 and Minimal assistance with HEP/ education concepts  Long Term Goals: (2 month(s)): Pain/Sx 0 - minimal, ROM/ flexibility WNL , Restoration of functional strength, Functional return to ADLs / activities without limitations     Treatment Plan:   Options / plan reviewed with patient/family:  Yes  Freq 1 times per week for 1-2 month(s)    Treatment plan inclusive of:   Exercise: AROM, AAROM, Stretching, Strengthening, Progressive Resistive   Manual Techniques:  Joint mobilization   Modalities:  Cold pack, Functional/Therapeutic activites per flowsheet, Moist heat, Ther Exercise per flowsheet   Functional: Proprioception/Dynamic stability, Functional rehab, Balance , Home management    Thank you for referring this patient to [facility].    Germaine Pomfret, PT    Bike w/u    R knee LAD 2 minutes   SLR w/QS Pain w/ QS   Heel slides 2x10   S/L Hip Abd 2x10   Seated Calf stretch 3x30 seconds   LAQ 4# 2x10       Leg Press    Ant Step ups    Heel raises                    Ice Right knee 10 minutes      Minutes   Time Based CPT  Physical Performance test, Therapeutic Exercise, Therapeutic Activities, NM Re-Education, Manual Therapy, Gait Training, Massage, Aquatic Therapy, Canalith Repositioning, Iontophoresis, Ultrasound, Orthotic fitting/training, Prosthetic fitting 15   Service Based (untimed) CPT  PT/OT evaluation, PT/OT re-eval, E-stim-unattended, Mechanical traction, Vasopneumatic device 30   Unbilled time (rest, etc)        Total Treatment Time 45

## 2019-04-04 ENCOUNTER — Ambulatory Visit: Payer: Medicaid Other | Attending: Orthopedic Surgery | Admitting: Rehabilitative and Restorative Service Providers"

## 2019-04-04 DIAGNOSIS — M25561 Pain in right knee: Secondary | ICD-10-CM

## 2019-04-07 NOTE — Progress Notes (Deleted)
[facility] Orthopedic Sports/Spine Rehabilitation  PT Treatment Note    Todays Date: 04/07/2019    Name: NAKYRA BOURN  DOB: 02-19-1966  Referring Physician: Bo Merino, MD  Diagnosis: No diagnosis found.    Visit #: Visit count could not be calculated. Make sure you are using a visit which is associated with an episode.    Subjective:  Pain Assessment: {PAIN SCALE:23261}  {PT/OT Subjective:23896}       Objective:  ROM -  Right, Knee     KNEE LEFT RIGHT STRENGTH    PROM AROM PROM AROM Left Right   Flexion  127  115 4+ 5   Extension  2 HE  2 HE 4+ 5                Strength - Functional activities as charted  Function: - {Improved/Worse/unchg:26239}  Education:  {Education:26246}    Objective        Treatment:  Ther Exercise per flowsheet     Bike w/u    R knee LAD 2 minutes   SLR w/QS Pain w/ QS   Heel slides 2x10   S/L Hip Abd 2x10   Seated Calf stretch 3x30 seconds   LAQ 4# 2x10       Leg Press    Ant Step ups    Heel raises                    Ice Right knee 10 minutes       Assessment:   ***       Plan of Care:  Continue per Plan of care -  As written; Patient would benefit from skilled rehabilitation services to address the above impairments to restore functional capacity.    Thank you for referring this patient to [facility] [place], PT       Minutes   Time Based CPT  Physical Performance test, Therapeutic Exercise, Therapeutic Activities, NM Re-Education, Manual Therapy, Gait Training, Massage, Aquatic Therapy, Canalith Repositioning, Iontophoresis, Ultrasound, Orthotic fitting/training, Prosthetic fitting ***   Service Based (untimed) CPT  PT/OT evaluation, PT/OT re-eval, E-stim-unattended, Mechanical traction, Vasopneumatic device    Unbilled time (rest, etc)        Total Treatment Time

## 2019-04-10 ENCOUNTER — Ambulatory Visit: Payer: Medicaid Other | Admitting: Rehabilitative and Restorative Service Providers"

## 2019-04-10 DIAGNOSIS — M25561 Pain in right knee: Secondary | ICD-10-CM

## 2019-04-10 NOTE — Progress Notes (Signed)
[facility] Orthopedic Sports/Spine Rehabilitation  PT Treatment Note    Todays Date: 04/10/2019    Name: Loretta [name]  DOB: 1966/04/16  Referring Physician: Bo Merino, MD  Diagnosis:   1. Acute pain of right knee         Visit #: 2    Subjective:  Pain Assessment: 2  Patient states they were no worse after therapy and she has not done anything this week to irritate it. Pain is low because it is early in the day.        Objective:  ROM -  Right, Knee NT today               KNEE LEFT RIGHT STRENGTH    PROM AROM PROM AROM Left Right   Flexion  127  115 4+ 5   Extension  2 HE  2 HE 4+ 5              Strength - Functional activities as charted  Function: - Unchanged  Education:  Updated HEP, Verbal cues for ther ex    Objective        Treatment:  Ther Exercise per flowsheet    Bike w/u 5 minutes   R knee LAD 2 minutes   SLR w/QS x15 pain   Heel slides 2x10   S/L Hip Abd 3x10   Seated Calf stretch 3x30 seconds   LAQ 4# x25       Leg Press    Ant Step ups    Heel raises                    Ice Right knee To go       Assessment:   Patient states she had mild knee pain when flexing on the bike. She anticipates possible increased pain later from this. She was unable to complete the SLR's due to throbbing in the medial knee. Treatment was limited due to onset of pain. Gentle LAD provides relief. Heel raises were added to HEP and SLR if tolerated. Recommend careful progression of exercises 1x/week.       Plan of Care:  Continue per Plan of care -  As written; Patient would benefit from skilled rehabilitation services to address the above impairments to restore functional capacity.    Thank you for referring this patient to [facility] [place], PT       Minutes   Time Based CPT  Physical Performance test, Therapeutic Exercise, Therapeutic Activities, NM Re-Education, Manual Therapy, Gait Training, Massage, Aquatic  Therapy, Canalith Repositioning, Iontophoresis, Ultrasound, Orthotic fitting/training, Prosthetic fitting 30   Service Based (untimed) CPT  PT/OT evaluation, PT/OT re-eval, E-stim-unattended, Mechanical traction, Vasopneumatic device    Unbilled time (rest, etc)        Total Treatment Time 30

## 2019-04-17 ENCOUNTER — Ambulatory Visit: Payer: Medicaid Other | Admitting: Rehabilitative and Restorative Service Providers"

## 2019-04-17 ENCOUNTER — Encounter: Payer: Self-pay | Admitting: Rehabilitative and Restorative Service Providers"

## 2019-04-24 ENCOUNTER — Ambulatory Visit: Payer: Medicaid Other | Admitting: Rehabilitative and Restorative Service Providers"

## 2019-04-24 DIAGNOSIS — M25561 Pain in right knee: Secondary | ICD-10-CM

## 2019-04-24 NOTE — Progress Notes (Signed)
[facility] Orthopedic Sports/Spine Rehabilitation  PT Treatment Note    Todays Date: 04/24/2019    Name: LEELOO SILVERTHORNE  DOB: 24-Nov-1965  Referring Physician: Bo Merino, MD  Diagnosis:   No diagnosis found.    Visit #: 3    Subjective:  Pain Assessment: 3  Patient states she was hurting more the whole next day after last session. She remembers having pain with the SLR's. Today just doing a quad set hurts her knee and she began to hurt the last minute on the bike. "Today is not a good day". Patient missed last weeks session while awaiting COVID results (she was negative).     Objective:  ROM -  Right, Knee Onset of right knee pain at 95 degrees flexion.               KNEE LEFT RIGHT STRENGTH    PROM AROM PROM AROM Left Right   Flexion  127  105 4+ 5   Extension  2 HE  2 HE 4+ 5              Strength - Functional activities as charted  Function: - Worsened. Reduced ROM and increased soreness.   Education:  Updated HEP, Verbal cues for ther ex    Objective        Treatment:  Ther Exercise per flowsheet    Bike w/u 5 minutes   R knee LAD 2 minutes   SLR w/QS HOLD   Heel slides 2x10   S/L Hip Abd 2x10   Seated Calf stretch 3x30 seconds   LAQ 4# x20 pain       Leg Press    Ant Step ups    Heel raises 2x10                   Ice Right knee To go       Assessment:   Caution was used with today's exercise routine and she still became more sore. ROM was stiffer and more sore today. Beneficial treatment was limited due to onset of pain. Gentle LAD provides relief. Pain increased up to 4/10 at the end of the session. Recommend careful progression of exercises 1x/week.        Plan of Care:  Continue per Plan of care -  As written; Patient would benefit from skilled rehabilitation services to address the above impairments to restore functional capacity.    Thank you for referring this patient to [facility] [place],  PT       Minutes   Time Based CPT  Physical Performance test, Therapeutic Exercise, Therapeutic Activities, NM Re-Education, Manual Therapy, Gait Training, Massage, Aquatic Therapy, Canalith Repositioning, Iontophoresis, Ultrasound, Orthotic fitting/training, Prosthetic fitting 30   Service Based (untimed) CPT  PT/OT evaluation, PT/OT re-eval, E-stim-unattended, Mechanical traction, Vasopneumatic device    Unbilled time (rest, etc) 15       Total Treatment Time 45

## 2019-05-01 ENCOUNTER — Ambulatory Visit: Payer: Medicaid Other | Attending: Orthopedic Surgery | Admitting: Rehabilitative and Restorative Service Providers"

## 2019-05-01 DIAGNOSIS — M25561 Pain in right knee: Secondary | ICD-10-CM

## 2019-05-01 NOTE — Progress Notes (Signed)
[facility] Orthopedic Sports/Spine Rehabilitation  PT Treatment Note    Todays Date: 05/01/2019    Name: Loretta [name]  DOB: 1965-10-19  Referring Physician: Roselie Awkward, MD  Diagnosis:   1. Acute pain of right knee         Visit #: 4    Subjective:  Pain Assessment: 2  Patient states she was in a lot of pain the rest of the day after last visit. She returned to work on Monday and is feeling ok. She must take one step at a time to enter the bus. Just weight bearing is painful.       Objective:  ROM -  Right, Knee Onset of right knee pain at 95 degrees flexion.               KNEE LEFT RIGHT STRENGTH    PROM AROM PROM AROM Left Right   Flexion  127  117 * 4+ 5   Extension  2 HE 2HE * 2  4+ 5              Strength - Functional activities as charted  Function: - Unchanged.    Education:  Verbal cues for ther ex, Joint ed concepts    Objective        Treatment:  Ther Exercise per flowsheet & ice    Bike w/u Hold   R knee LAD 2 minutes   SLR w/QS HOLD   Heel slides x25   S/L Hip Abd 3x10   Seated Calf stretch 3x30 seconds   LAQ 4# 2x15       Leg Press    Ant Step ups 4" pain   Heel raises 2x10       Ice Right knee 10 minutes       Assessment:   The bike was avoided today due to onset of pain last visit. There is tenderness along the medial joint line with some relief with gentle LAD. Patient is unable to ascend or descend stairs. Her exercise routine is limited due to onset of medial knee pain. Tibial torsion causes a sharp pain that continues to ache after. Await MD orders after next weeks follow up. There are no future appointments scheduled at this time.        Plan of Care:  Continue per Plan of care -  As written; Patient would benefit from skilled rehabilitation services to address the above impairments to restore functional capacity.    Thank you for referring this patient to [facility] and Spine Rehabilitation    Germaine Pomfret, PT       Minutes   Time Based CPT  Physical Performance test, Therapeutic Exercise, Therapeutic Activities, NM Re-Education, Manual Therapy, Gait Training, Massage, Aquatic Therapy, Canalith Repositioning, Iontophoresis, Ultrasound, Orthotic fitting/training, Prosthetic fitting 30   Service Based (untimed) CPT  PT/OT evaluation, PT/OT re-eval, E-stim-unattended, Mechanical traction, Vasopneumatic device    Unbilled time (rest, etc) 10       Total Treatment Time 40

## 2019-05-08 ENCOUNTER — Ambulatory Visit
Admission: RE | Admit: 2019-05-08 | Discharge: 2019-05-08 | Disposition: A | Discharge status: Home / Self Care | Payer: Medicaid Other | Source: Ambulatory Visit | Attending: Orthopedic Surgery | Admitting: Orthopedic Surgery

## 2019-05-08 ENCOUNTER — Ambulatory Visit: Payer: Medicaid Other | Admitting: Orthopedic Surgery

## 2019-05-08 ENCOUNTER — Encounter: Payer: Self-pay | Admitting: Orthopedic Surgery

## 2019-05-08 VITALS — BP 137/74 | HR 94 | Ht 63.5 in | Wt 252.0 lb

## 2019-05-08 DIAGNOSIS — M25562 Pain in left knee: Secondary | ICD-10-CM

## 2019-05-08 DIAGNOSIS — M25561 Pain in right knee: Secondary | ICD-10-CM | POA: Insufficient documentation

## 2019-05-08 NOTE — Progress Notes (Signed)
She has had no improvement in her symptoms with the physical therapy.  She complains of continued medial pain right knee.    Physical examination right knee: BMI equals 43.94.  She has full range of motion of the right knee.  She has no palpable effusion.  She has medial joint line tenderness with no lateral joint line tenderness.  She has medial pain with circumduction.  No lateral symptoms with circumduction.    Weightbearing radiographs of the right knee are ordered and reviewed today.  They show some decreased medial joint space.  There otherwise within normal limits.    Impression: Possible re-tear medial meniscus right knee.  She had previous partial medial meniscectomy and initially had considerable improvement and then started getting pain again.  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    Plan: I reviewed this with her.  I did order MRI examination the right knee for further evaluation of potential further meniscus tear.  I will see her back shortly following that study.    This note was transcribed using voice recognition software.  Please excuse irregularities that may result.

## 2019-05-09 ENCOUNTER — Encounter: Payer: Self-pay | Admitting: Rehabilitative and Restorative Service Providers"

## 2019-05-15 ENCOUNTER — Ambulatory Visit
Admission: RE | Admit: 2019-05-15 | Discharge: 2019-05-15 | Disposition: A | Discharge status: Home / Self Care | Payer: Medicaid Other | Source: Ambulatory Visit | Attending: Orthopedic Surgery | Admitting: Orthopedic Surgery

## 2019-05-15 DIAGNOSIS — S8001XA Contusion of right knee, initial encounter: Secondary | ICD-10-CM | POA: Insufficient documentation

## 2019-05-15 DIAGNOSIS — M228X1 Other disorders of patella, right knee: Secondary | ICD-10-CM | POA: Insufficient documentation

## 2019-05-15 DIAGNOSIS — M25561 Pain in right knee: Secondary | ICD-10-CM

## 2019-05-17 ENCOUNTER — Encounter: Payer: Self-pay | Admitting: Orthopedic Surgery

## 2019-05-17 ENCOUNTER — Ambulatory Visit: Payer: Medicaid Other | Admitting: Orthopedic Surgery

## 2019-05-17 VITALS — BP 156/74 | HR 95 | Ht 63.0 in | Wt 250.0 lb

## 2019-05-17 DIAGNOSIS — M25561 Pain in right knee: Secondary | ICD-10-CM

## 2019-05-17 DIAGNOSIS — M2241 Chondromalacia patellae, right knee: Secondary | ICD-10-CM

## 2019-05-17 NOTE — Progress Notes (Signed)
She returns following the MRI examination of her right knee.  This is reviewed with her.  It does show her to be status post previous partial medial meniscectomy.  No further meniscal tearing is seen.  She does have considerable chondral damage of the inferior pole of the patella from the apex extending medially and laterally.  There is underlying bone edema.    Impression: Right knee patellar chondromalacia.  Previous partial medial meniscectomy with no further meniscal tearing seen.    Plan: I reviewed this with her.  I will get her scheduled with a nonoperative knee arthritis physician to consider injections, etc.  No further arthroscopic treatment indicated at this time.    This note was transcribed using voice recognition software.  Please excuse irregularities that may result.

## 2019-05-21 ENCOUNTER — Ambulatory Visit: Payer: Medicaid Other | Admitting: Physical Medicine and Rehabilitation

## 2019-05-21 ENCOUNTER — Encounter: Payer: Self-pay | Admitting: Physical Medicine and Rehabilitation

## 2019-05-21 VITALS — BP 177/81 | HR 95 | Temp 98.0°F | Ht 63.5 in | Wt 257.0 lb

## 2019-05-21 DIAGNOSIS — M1711 Unilateral primary osteoarthritis, right knee: Secondary | ICD-10-CM

## 2019-05-21 DIAGNOSIS — G8929 Other chronic pain: Secondary | ICD-10-CM

## 2019-05-21 DIAGNOSIS — M25561 Pain in right knee: Secondary | ICD-10-CM

## 2019-05-21 NOTE — Patient Instructions (Signed)
Continue home exercises.    Recommend low-impact exercise (e.g., walking, biking, elliptical, swimming/pool).     ACSM/CDC guidelines: At least 30 minutes per day of moderate-intensity exercise, 4-5 times per week    Steps:  Start at 6,000 steps per day, then increase by 1,000 steps per day or per week.  Eventual goal is 10,000 steps per day.  Decrease if pain gets worse.    Discomfort during exercise may be normal.  Greater than 2 hours of pain or swelling: requires modification of program.    Avoid deep squats and other activities that put a lot of pressure on the knee.     Discussed importance of weight loss.     Ice 15 minutes, several times a day as needed and after exercise. Can alternate with heat for 15 minutes.     Acetaminophen 500 mg (Tylenol Extra Strength): take 1-2 tablets every 6 hours as needed for pain.  - Do not take more than a total of 3,000 mg (6 Extra Strength tablets) in a 24-hour period.    Appropriate dosing of naproxen (also known as Aleve) is 1-2 tabs twice a day. The bottle will say 1 tab twice a day; for prescription strength level you can take 2 tabs twice a day. Also take with food.    Appropriate dosing of ibuprofen (also known as Motrin or Advil) is:   600-800 mg every 8 hours with food. Each of the small tabs that you can buy over the counter is 200 mg, so you can take 3-4 pills with food three times a day.    Pick one or the other; don't take both Aleve and Motrin/Advil.    - Tylenol (acetaminophen) is OK to take at the same time; just don't go above 3,000 mg per day (total of 6 extra-strength tabs).    Try an over-the-counter topical gel, cream, or ointment for pain relief. Some examples include:   - Menthol-based treatment (e.g., Biofreeze, IcyHot)  - Capsaicin cream  - Diclofenac (Voltaren) gel - a non-steroidal anti-inflammatory drug (NSAID)  Apply to the affected area up to 4 times per day.    Follow up in Injection Clinic for ultrasound-guided Right knee viscosupplementation  (gel) injection.

## 2019-05-21 NOTE — Progress Notes (Addendum)
Patient: Loretta [name]   MRN: [id number]  DOB: Jan 05, 1966   Date: 05/21/2019     Chief Complaint: Right knee pain  Subjective   History of Present Illness:  Loretta [name] is a 54 y.o. female who presents with Right knee pain.     Onset/Duration: Original onset of pain in August 2019.  Most recent episode of pain started in November 2020.  Mechanism of Injury: In August 2019, she was dancing at a concert, when she experienced sudden pain in the right knee.  Injury Course: She was previously seen in PM&R clinic by Dr. Caroleen Hamman, last seen 03/05/2018 for right medial meniscus tear and had failed PT and steroid injection at that time.  She was then referred to Dr. Lovie Macadamia in Orthopaedic Surgery clinic for consideration of surgical treatment for the meniscus tear. Status post arthroscopy of the right knee with patellar chondroplasty and partial medial meniscectomy 04/03/2018, with no evidence of further tear on recent follow-up with Orthopaedic Surgery earlier this month.  She states that after the surgery, healing went well with no issues.  Later on, in November 2020, she was at a hairdresser appointment and spun a chair with her right foot, when she started experiencing the same type of right knee pain as she had previously.  She later followed up with Dr. Lovie Macadamia, did physical therapy which resulted in no change in pain.  MRI was then obtained.  She was last seen in Orthopaedic Surgery clinic by Dr. Lovie Macadamia 05/17/2019 for right patellar chondromalacia/DJD, referred for further nonoperative treatment and consideration of injections.  Location: Anteromedial knee  Quality: Constant dull pain, sometimes sharp and stabbing  Timing: Constant  Severity: 3/10  Aggravating/Alleviating Factors: Can't kneel down, put shoe on, bend knee fully, sit at barstool  Associated Symptoms: Reports cracking, popping and stiffness  Symptoms interfere with mobility, exercise, work, sleep and activities of daily living.    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  Acetaminophen: not tried  NSAIDs: ibuprofen, no relief   Topicals: not tried   Modalities: cold, some relief  Other Medications/Supplements: not tried   Alternative treatments: not tried   Corticosteroid injections: Right knee steroid injection 12/29/2017, 75% relief for 2-3 weeks    Viscosupplementation: not tried  Other procedures: not tried   Surgery: arthroscopy of the right knee with patellar chondroplasty and partial medial meniscectomy 04/03/2018    I reviewed the patient's medical history, surgical history, medications, allergies, family history and social history and updated them as appropriate in the electronic medical record.     Works as a Teacher, early years/pre.    Review of Systems: Review of systems was performed and was positive for Weight gain, joint pain, joint stiffness. All other systems negative.      Objective   Physical Examination:   BP 177/81 (BP Location: Left arm, Patient Position: Sitting, Cuff Size: adult)    Pulse 95    Temp 36.7 C (98 F) (Temporal)    Ht 1.613 m (5' 3.5")    Wt 116.6 kg (257 lb)    SpO2 97%    BMI 44.81 kg/m      Musculoskeletal:   Knee: Right  Inspection:  Knee effusion: None  Quadriceps atrophy: None   Popliteal (Baker's) cyst: Negative   No significant gross deformity, erythema or ecchymosis.    Static Alignment:  Knee:  neutral    Dynamic Test:  Gait: stiff  Assistive devices: None    Palpation:   No significant warmth about the knee. No significant effusion.   There is concordant tenderness to palpation of the medial joint line.      ROM:  Flexion: Right 115, Left 120   Extension: Right 0, Left 0  Patellofemoral crepitus: Positive    Special Tests:  Lachman: Negative with good endpoint   Anterior drawer: Negative   Posterior drawer: Negative  Posterior sag sign: Negative  Valgus stress: Negative with good  endpoint   Varus stress: Negative with good endpoint     Strength (0-5/5):   Knee Flexion: Right 5/5, Left 5/5    Knee Extension: Right 5/5, Left 5/5      Labs:   Personally reviewed patient's prior labs as follows:  No results found for: HA1C, INR, CREAT, AST, ALT, ALB      Imaging:   Previous imaging studies:  XR right knee 12/29/2017 - I personally reviewed the images 05/21/2019 which showed: No evidence of fracture or dislocation.  Unremarkable appearance of the right knee joint.     MRI right knee 05/15/2019 report:   IMPRESSION:  Post interval partial medial meniscectomy with findings suggestive of contusion of the posterior horn of the medial meniscus.  Contusion of the posterior medial femoral condyle.   Fissuring and near full-thickness cartilage loss at the inferior pole of the patella with associated subchondral patellar cyst formation. There is a reported history patellar chondroplasty.     Assessment    Impression:  1. Chronic pain of right knee    2. Primary osteoarthritis of right knee       Recommendations:  Education: Educated patient on diagnosis and treatment of knee osteoarthritis.   Home Exercise Program: Continue home exercise program.   Recommend low-impact exercise (e.g., walking, biking, elliptical, swimming/pool).   Avoid deep squats.   Discussed ACSM/CDC general exercise guidelines.   Modalities: Ice 15 minutes, several times a day as needed and after exercise.  Can alternate with heat for 15 minutes.   Medications:   Discussed judicious use of Tylenol   Acetaminophen (Tylenol): 500 mg 1-2 tablets PO every 6 hours as needed for pain   - Discussed judicious use of over-the-counter NSAIDs.   Either Naproxen (Aleve) 220 mg: 1-2 tablets PO twice a day PRN, OR Ibuprofen (Motrin, Advil) 200 mg: 1-4 tablets PO three times a day PRN  Trial OTC topical treatment (e.g., Biofreeze/IcyHot or other menthol-based topical treatment, capsaicin cream, or diclofenac gel) up to 4 times per day  Interventions:    We will submit authorization for Right knee viscosupplementation injection with ultrasound guidance.     Return to clinic:   Return for Viscosupplementation - secretary to schedule - R knee visco inj w/ USG.     No orders of the defined types were placed in this encounter.    Addendum: Viscosupplementation is not covered by the patient's insurance. We had also discussed Coolief as another interventional option for knee pain. Will contact patient to see if she is amenable to diagnostic genicular nerve block and potential Coolief.

## 2019-06-03 ENCOUNTER — Encounter: Payer: Self-pay | Admitting: Physical Medicine and Rehabilitation

## 2019-06-03 ENCOUNTER — Ambulatory Visit
Payer: Medicaid Other | Attending: Physical Medicine and Rehabilitation | Admitting: Physical Medicine and Rehabilitation

## 2019-06-03 VITALS — BP 142/77 | HR 85 | Temp 97.2°F

## 2019-06-03 DIAGNOSIS — M25561 Pain in right knee: Secondary | ICD-10-CM | POA: Insufficient documentation

## 2019-06-03 DIAGNOSIS — G8929 Other chronic pain: Secondary | ICD-10-CM

## 2019-06-03 DIAGNOSIS — M1711 Unilateral primary osteoarthritis, right knee: Secondary | ICD-10-CM | POA: Insufficient documentation

## 2019-06-03 MED ORDER — LIDOCAINE HCL 1% IJ SOLN (PF) (AMB) *I*
4.0000 mL | Freq: Once | INTRAMUSCULAR | Status: AC | PRN
Start: 2019-06-03 — End: 2019-06-03
  Administered 2019-06-03: 4 mL

## 2019-06-03 MED ORDER — METHYLPREDNISOLONE ACETATE 40 MG/ML IJ SUSP *I*
40.0000 mg | Freq: Once | INTRAMUSCULAR | Status: AC | PRN
Start: 2019-06-03 — End: 2019-06-03
  Administered 2019-06-03: 40 mg

## 2019-06-03 NOTE — Procedures (Signed)
Date/Time: 06/03/2019 10:00 AM EST  Tendons, Ligaments, Muscles, and Joint/Bursa Injection(s): Large Joint w/USG - CPT 20611  Laterality: Right  Anesthetic medications - Right: 4 mL Lidocaine HCl 1 %  Steroid medication - Right: 40 mg methylPREDNISolone acetate 40 MG/ML    Patient: Loretta [name]  MRN: [id number]  DOB: May 08, 1965   Date: 06/03/2019     Procedure: Right knee intraarticular steroid injection with ultrasound guidance    Equipment: GE LOGIQ P9    Procedure Performed by: Burnett Kanaris, MBBS  Supervising Attending: Mathis Bud, MD    Indication: Right knee pain due to osteoarthritis     Chart Review: I have reviewed the patient's prior notes and her medical history, surgical history, medications, allergies, family history and social history and updated them as appropriate in the electronic medical record.     Consent: Risks, benefits, and alternatives to injection were discussed with the patient, including the possibility of pain, bleeding, infection, soft tissue injury, systemic reaction, and lack of clinical improvement. The patient verbalized understanding and agreed to the procedure.   A time-out was performed prior to the procedure: Correct patient (name and date of birth). Correct procedure.Correct site marked.    Setup: The patient was placed in a supine position. A bolster was placed under the knee. A linear transducer was used to visualize the joint. Ultrasound images of the injection site in long and short axes were obtained, and appropriate images were labeled, saved, and permanently archived. The injection site was marked lateral to the suprapatellar recess. The skin was prepped with chlorhexidine in sterile fashion.    Findings: A limited diagnostic scan of the joint was performed. Minimal fluid collection without synovitis was seen. No disruption of tendons.     Technique: The procedure was carried out under sterile prep with sterile gel. Under continuous ultrasound guidance, a 25G  1.5 inch needle was directed from lateral to medial into the Right knee suprapatellar recess using an in-plane needle orientation, with avoidance of bony prominences, blood vessels, and other vulnerable structures. Needle tip placement was confirmed under ultrasound. 1 mL of Depo-Medrol (methylprednisolone acetate) 40 mg/mL + 4 mL of lidocaine 1% was injected into the suprapatellar recess with free flow of fluid into the joint visualized. The needle was withdrawn, the area was cleaned, hemostasis was achieved, and a bandage was applied.     Ultrasound interpretation was performed prior to the procedure to identify the target and any adjacent neurovascular structures. Subsequently, interpretation was performed during real-time needle guidance confirming placement. Post-intervention interpretation was also performed confirming appropriate injectate flow and hemostasis.     The patient tolerated the procedure well without complications. Warning signs and routine aftercare were discussed with the patient.     Impression: Ultrasound Real time guidance for Right knee intraarticular steroid injection for knee pain due to osteoarthritis     Follow-up: 6 weeks for reassessment

## 2019-06-03 NOTE — Patient Instructions (Addendum)
PM&R Post Injection/Procedure Instructions    You received a steroid/cortisone injection today.  This injection has numbing medicine (Lidocaine) and steroid medication.  The numbing medicine may start to work right away and give you immediate pain relief.  However, this will wear off in several hours.  It can take 5-7 days and sometimes up to two weeks for the steroid medication to take effect.  Therefore, please give the injection at least a week to start to notice a benefit.     Notify the office [phone] if you experience any of the following after today’s procedure:  • Fever of 100 degrees Fahrenheit or 38 degrees Celsius or greater  • Excessive swelling or redness at the injection site  • Excessive pain or numbness that lasts longer than 72 hours after your procedure.  • Seek urgent medical care if staff is unavailable.   Care of the injection site:  • You may ice the injection site for local discomfort.  Ice should be covered with a cloth - Never place directly on skin.  • Apply ice for 15 minutes on then off for 1 hour.  Repeat as needed.  • Take band aid off any time.  • Do not submerge the area in water for 24 hours - showering is OK.  • Avoid strenuous activity for the first two days after injection.  Diabetic patients:  • Your blood sugar can be elevated for about a week due to the steroid used in the injection, so check your blood sugar regularly.  • Call your primary care physician if it goes above 300.  Common side effects of injections:  • Bruising and minor swelling at site  • Increased pain  • Numbness for the first 6-8 hours after injection - this should subside or return to baseline  • Sometimes the medication can cause skin pigment lightening or changes.    Follow up in clinic in 6 weeks for reassessment.

## 2019-07-16 ENCOUNTER — Encounter: Payer: Self-pay | Admitting: Physical Medicine and Rehabilitation

## 2019-07-16 ENCOUNTER — Ambulatory Visit: Payer: Medicaid Other | Admitting: Physical Medicine and Rehabilitation

## 2019-07-16 VITALS — BP 138/90 | HR 101 | Temp 97.2°F | Ht 63.5 in | Wt 257.0 lb

## 2019-07-16 DIAGNOSIS — G8929 Other chronic pain: Secondary | ICD-10-CM

## 2019-07-16 DIAGNOSIS — M1711 Unilateral primary osteoarthritis, right knee: Secondary | ICD-10-CM

## 2019-07-16 DIAGNOSIS — M25561 Pain in right knee: Secondary | ICD-10-CM

## 2019-07-16 NOTE — Progress Notes (Signed)
Patient: Loretta [name]   MRN: [id number]  DOB: 03-18-1966   Date: 07/16/2019     Chief Complaint: Right knee pain  Subjective   History of Present Illness:  Loretta [name] is a 54 y.o. female who presents for follow-up of Right knee pain. Last clinic visit on 06/03/2019 for right knee steroid injection with ultrasound guidance.   She reports good pain relief from the steroid injection  Used ice for the first 3 days after the injection  Recently she has started noticing little twinges of pain coming back, but overall the knee pain is tolerable  Sometimes gets pain when sitting or lying down, but not all the time  Takes ibuprofen if the pain is really bad (only once last week)    Recall (05/21/2019):   Onset/Duration: Original onset of pain in August 2019.  Most recent episode of pain started in November 2020.  Mechanism of Injury: In August 2019, she was dancing at a concert, when she experienced sudden pain in the right knee.  Injury Course: She was previously seen in PM&R clinic by Dr. Caroleen Hamman, last seen 03/05/2018 for right medial meniscus tear and had failed PT and steroid injection at that time.  She was then referred to Dr. Lovie Macadamia in Orthopaedic Surgery clinic for consideration of surgical treatment for the meniscus tear. Status post arthroscopy of the right knee with patellar chondroplasty and partial medial meniscectomy 04/03/2018, with no evidence of further tear on recent follow-up with Orthopaedic Surgery earlier this month.  She states that after the surgery, healing went well with no issues.  Later on, in November 2020, she was at a hairdresser appointment and spun a chair with her right foot, when she started experiencing the same type of right knee pain as she had previously.  She later followed up with Dr. Lovie Macadamia, did physical therapy which resulted in no change in pain.  MRI was then obtained.  She was last seen in Orthopaedic Surgery clinic by Dr. Lovie Macadamia 05/17/2019 for right patellar  chondromalacia/DJD, referred for further nonoperative treatment and consideration of injections.  Location: Anteromedial knee  Quality: Constant dull pain, sometimes sharp and stabbing  Timing: Constant  Severity: 3/10  Aggravating/Alleviating Factors: Can't kneel down, put shoe on, bend knee fully, sit at barstool  Associated Symptoms: Reports cracking, popping and stiffness  Symptoms interfere with mobility, exercise, work, sleep and activities of daily living.    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  Acetaminophen: not tried  NSAIDs: ibuprofen, no relief   Topicals: not tried   Modalities: cold, some relief  Other Medications/Supplements: not tried   Alternative treatments: not tried   Corticosteroid injections: Right knee steroid injection 12/29/2017, 75% relief for 2-3 weeks    Right knee steroid injection with ultrasound guidance 06/03/2019, good relief  Viscosupplementation: not tried  Other procedures: not tried   Surgery: arthroscopy of the right knee with patellar chondroplasty and partial medial meniscectomy 04/03/2018    I reviewed the patient's medical history, surgical history, medications, allergies, family history and social history and updated them as appropriate in the electronic medical record.     Works as a Teacher, early years/pre.    Review of Systems: Review of systems was performed and was positive as per HPI above. All other systems negative.      Objective   Physical Examination:   BP  138/90 (BP Location: Left arm, Patient Position: Sitting, Cuff Size: adult)    Pulse 101    Temp 36.2 C (97.2 F)    Ht 1.613 m (5' 3.5")    Wt 116.6 kg (257 lb)    SpO2 97%    BMI 44.81 kg/m      Musculoskeletal:   Knee: Right  Inspection:  Knee effusion: None  Quadriceps atrophy: None   Popliteal (Baker's) cyst: Negative   No significant gross deformity, erythema or ecchymosis.    Static  Alignment:  Knee: neutral    Dynamic Test:  Gait: stiff  Assistive devices: None    Palpation:   No significant warmth about the knee. No significant effusion.   There is concordant tenderness to palpation of the medial joint line.      ROM:  Flexion: Right 115, Left 120   Extension: Right 0, Left 0  Patellofemoral crepitus: Positive    Special Tests:  Lachman: Negative with good endpoint   Anterior drawer: Negative   Posterior drawer: Negative  Posterior sag sign: Negative  Valgus stress: Negative with good endpoint   Varus stress: Negative with good endpoint     Strength (0-5/5):   Knee Flexion: Right 5/5, Left 5/5    Knee Extension: Right 5/5, Left 5/5      Labs:   Personally reviewed patient's prior labs as follows:  No results found for: HA1C, INR, CREAT, AST, ALT, ALB      Imaging:   Previous imaging studies:  XR right knee 12/29/2017 - I personally reviewed the images 05/21/2019 which showed: No evidence of fracture or dislocation.  Unremarkable appearance of the right knee joint.     MRI right knee 05/15/2019 report:   IMPRESSION:  Post interval partial medial meniscectomy with findings suggestive of contusion of the posterior horn of the medial meniscus.  Contusion of the posterior medial femoral condyle.   Fissuring and near full-thickness cartilage loss at the inferior pole of the patella with associated subchondral patellar cyst formation. There is a reported history patellar chondroplasty.     Assessment    Impression:  1. Chronic pain of right knee    2. Primary osteoarthritis of right knee       Recommendations:  Home Exercise Program: Continue home exercise program.   Recommend low-impact exercise (e.g., walking, biking, elliptical, swimming/pool).   Avoid deep squats.   Discussed ACSM/CDC general exercise guidelines.   Modalities: Ice 15 minutes, several times a day as needed and after exercise.  Can alternate with heat for 15 minutes.   Medications:   Discussed judicious use of Tylenol    Acetaminophen (Tylenol): 500 mg 1-2 tablets PO every 6 hours as needed for pain   - Discussed judicious use of over-the-counter NSAIDs.   Either Naproxen (Aleve) 220 mg: 1-2 tablets PO twice a day PRN, OR Ibuprofen (Motrin, Advil) 200 mg: 1-4 tablets PO three times a day PRN  May use OTC topical treatment (e.g., Biofreeze/IcyHot or other menthol-based topical treatment, capsaicin cream, or diclofenac gel) up to 4 times per day as needed    Return to clinic: PRN  Return if symptoms worsen or fail to improve.     No orders of the defined types were placed in this encounter.

## 2019-07-16 NOTE — Patient Instructions (Signed)
Continue home exercises.    Recommend low-impact exercise (e.g., walking, biking, elliptical, swimming/pool).     ACSM/CDC guidelines: At least 30 minutes per day of moderate-intensity exercise, 4-5 times per week    Steps:  Start at 6,000 steps per day, then increase by 1,000 steps per day or per week.  Eventual goal is 10,000 steps per day.  Decrease if pain gets worse.    Discomfort during exercise may be normal.  Greater than 2 hours of pain or swelling: requires modification of program.    Avoid deep squats and other activities that put a lot of pressure on the knee.     Discussed importance of weight loss.     Ice 15 minutes, several times a day as needed and after exercise. Can alternate with heat for 15 minutes.     Acetaminophen 500 mg (Tylenol Extra Strength): take 1-2 tablets every 6 hours as needed for pain.  - Do not take more than a total of 3,000 mg (6 Extra Strength tablets) in a 24-hour period.    Appropriate dosing of naproxen (also known as Aleve) is 1-2 tabs twice a day. The bottle will say 1 tab twice a day; for prescription strength level you can take 2 tabs twice a day. Also take with food.    Appropriate dosing of ibuprofen (also known as Motrin or Advil) is:   600-800 mg every 8 hours with food. Each of the small tabs that you can buy over the counter is 200 mg, so you can take 3-4 pills with food three times a day.    Pick one or the other; don't take both Aleve and Motrin/Advil.    - Tylenol (acetaminophen) is OK to take at the same time; just don't go above 3,000 mg per day (total of 6 extra-strength tabs).    Try an over-the-counter topical gel, cream, or ointment for pain relief. Some examples include:   - Menthol-based treatment (e.g., Biofreeze, IcyHot)  - Capsaicin cream  - Diclofenac (Voltaren) gel - a non-steroidal anti-inflammatory drug (NSAID)  Apply to the affected area up to 4 times per day.    Follow up in clinic as needed if symptoms worsen or do not improve.

## 2019-08-14 LAB — LAB REPORT - SCANNED
ALT: 15 (ref 3–30)
AST: 32
BUN: 14
Cholesterol, Total: 194
Creatinine, Ser: 0.9
Glucose, Bld: 104 — AB (ref 70–99)
HCT: 39 (ref 29–41)
HDL: 46
Hemoglobin: 13.3
LDL: 131.6
Platelet: 259
Potassium: 4.2
RBC: 4.62
Sodium: 139
TSH: 2.14
Triglycerides: 82 (ref 40–160)
VITAMIN D 25-HYDROXY: 46.3
WBC: 7.2

## 2019-08-19 ENCOUNTER — Encounter: Payer: Self-pay | Admitting: Family Medicine

## 2019-08-19 DIAGNOSIS — E559 Vitamin D deficiency, unspecified: Secondary | ICD-10-CM | POA: Insufficient documentation

## 2019-08-19 DIAGNOSIS — I10 Essential (primary) hypertension: Secondary | ICD-10-CM | POA: Insufficient documentation

## 2019-08-19 DIAGNOSIS — E782 Mixed hyperlipidemia: Secondary | ICD-10-CM | POA: Insufficient documentation

## 2019-08-19 NOTE — Progress Notes (Signed)
Established Patient Office Visit  Subjective:  Patient ID: Paige Hardy, female    DOB: 02-Feb-1966  Age: 54 y.o. MRN: [id number]  CC:  Chief Complaint  Patient presents with  . Hypertension  . Hyperlipidemia    HPI patient is a 54 year old white female with history of hypertension and high cholesterol.  She takes her Lipitor about every 2 to 3 days.  She admits to forgetting to take it and and just not wanting to take medications.  She does take her losartan 25 mg once daily.  She has fallen off her diet and is not exercising as much.  She denies any chest pain or breathing problems.  In the past she has taken phentermine for approximately 3 months and lost 20 pounds.  She did very well but upon discontinuation she gained the weight back.    Past Medical History:  Diagnosis Date  . Essential hypertension   . Mixed hyperlipidemia   . Vitamin D deficiency       Family History  Problem Relation Age of Onset  . Hypertension Other     Social History   Socioeconomic History  . Marital status: Married    Spouse name: Not on file  . Number of children: Not on file  . Years of education: Not on file  . Highest education level: Not on file  Occupational History  . Not on file  Tobacco Use  . Smoking status: Never Smoker  . Smokeless tobacco: Never Used  Substance and Sexual Activity  . Alcohol use: Not Currently  . Drug use: Never  . Sexual activity: Not on file  Other Topics Concern  . Not on file  Social History Narrative  . Not on file   Social Determinants of Health   Financial Resource Strain:   . Difficulty of Paying Living Expenses:   Food Insecurity:   . Worried About Charity fundraiser in the Last Year:   . Arboriculturist in the Last Year:   Transportation Needs:   . Film/video editor (Medical):   Marland Kitchen Lack of Transportation (Non-Medical):   Physical Activity:   . Days of Exercise per Week:   . Minutes of Exercise per Session:   Stress:   .  Feeling of Stress :   Social Connections:   . Frequency of Communication with Friends and Family:   . Frequency of Social Gatherings with Friends and Family:   . Attends Religious Services:   . Active Member of Clubs or Organizations:   . Attends Archivist Meetings:   Marland Kitchen Marital Status:   Intimate Partner Violence:   . Fear of Current or Ex-Partner:   . Emotionally Abused:   Marland Kitchen Physically Abused:   . Sexually Abused:     Outpatient Medications Prior to Visit  Medication Sig Dispense Refill  . atorvastatin (LIPITOR) 10 MG tablet     . losartan (COZAAR) 25 MG tablet Take by mouth.    . Vitamin D, Ergocalciferol, (DRISDOL) 1.25 MG (50000 UNIT) CAPS capsule Take 50,000 Units by mouth once a week.     No facility-administered medications prior to visit.   ROS Review of Systems  Constitutional: Negative for chills, fatigue and fever.  HENT: Negative for congestion, ear pain, rhinorrhea and sore throat.   Respiratory: Negative for cough and shortness of breath.   Cardiovascular: Negative for chest pain.  Gastrointestinal: Positive for diarrhea (occasional. sometimes urgency.). Negative for abdominal pain, constipation, nausea and vomiting.  Genitourinary: Negative for dysuria and urgency.  Musculoskeletal: Positive for arthralgias. Negative for back pain and myalgias.       Hip and knee pain after sitting for long periods of time. Tylenol helps.   Neurological: Negative for dizziness, weakness, light-headedness and headaches.  Psychiatric/Behavioral: Negative for dysphoric mood. The patient is not nervous/anxious.       Objective:    Physical Exam  Constitutional: She is oriented to person, place, and time. She appears well-developed and well-nourished.  Cardiovascular: Normal rate, regular rhythm and normal heart sounds.  No murmur heard. Pulmonary/Chest: Effort normal and breath sounds normal. No respiratory distress.  Abdominal: Soft. There is no abdominal  tenderness.  Neurological: She is alert and oriented to person, place, and time.  Skin: Skin is warm.  Psychiatric: She has a normal mood and affect. Her behavior is normal.  Vitals reviewed.   BP 124/80   Pulse 72   Temp 97.8 F (36.6 C)   Resp 16   Ht 5' 5"  (1.651 m)   Wt 185 lb (83.9 kg)   BMI 30.79 kg/m  Wt Readings from Last 3 Encounters:  08/20/19 185 lb (83.9 kg)     Health Maintenance Due  Topic Date Due  . HIV Screening  Never done  . COVID-19 Vaccine (1) Never done  . PAP SMEAR-Modifier  Never done  . MAMMOGRAM  Never done  . COLONOSCOPY  Never done    There are no preventive care reminders to display for this patient.  No results found for: TSH No results found for: WBC, HGB, HCT, MCV, PLT No results found for: NA, K, CHLORIDE, CO2, GLUCOSE, BUN, CREATININE, BILITOT, ALKPHOS, AST, ALT, PROT, ALBUMIN, CALCIUM, ANIONGAP, EGFR, GFR No results found for: CHOL No results found for: HDL No results found for: LDLCALC No results found for: TRIG No results found for: CHOLHDL No results found for: HGBA1C    Assessment & Plan:  1. Essential hypertension Well controlled.  No changes to medicines.  Continue to work on eating a healthy diet and exercise.   2. Mixed hyperlipidemia Not at goal. Recommend take lipitor daily Continue to work on eating a healthy diet and exercise.   3. Vitamin D deficiency The current medical regimen is effective;  continue present plan and medications.  4. Class 1 obesity due to excess calories with serious comorbidity and body mass index (BMI) of 30.0 to 30.9 in adult The patient is asked to make an attempt to improve diet and exercise patterns to aid in medical management of this problem. Consider phentermine.       Follow-up: Return in about 3 months (around 11/19/2019) for fasting.    Rochel Brome, MD

## 2019-08-20 ENCOUNTER — Other Ambulatory Visit: Payer: Self-pay

## 2019-08-20 ENCOUNTER — Encounter: Payer: Self-pay | Admitting: Family Medicine

## 2019-08-20 ENCOUNTER — Ambulatory Visit: Payer: Commercial Managed Care - PPO | Admitting: Family Medicine

## 2019-08-20 VITALS — BP 124/80 | HR 72 | Temp 97.8°F | Resp 16 | Ht 65.0 in | Wt 185.0 lb

## 2019-08-20 DIAGNOSIS — I10 Essential (primary) hypertension: Secondary | ICD-10-CM

## 2019-08-20 DIAGNOSIS — E559 Vitamin D deficiency, unspecified: Secondary | ICD-10-CM | POA: Diagnosis not present

## 2019-08-20 DIAGNOSIS — E6609 Other obesity due to excess calories: Secondary | ICD-10-CM

## 2019-08-20 DIAGNOSIS — Z6830 Body mass index (BMI) 30.0-30.9, adult: Secondary | ICD-10-CM

## 2019-08-20 DIAGNOSIS — E66811 Obesity, class 1: Secondary | ICD-10-CM | POA: Insufficient documentation

## 2019-08-20 DIAGNOSIS — E782 Mixed hyperlipidemia: Secondary | ICD-10-CM | POA: Diagnosis not present

## 2019-08-30 LAB — UNMAPPED LAB RESULTS
Basophil # (HT): 0 10 3/uL — NL (ref 0.0–0.2)
Basophil % (HT): 0 % — NL (ref 0–2)
Eosinophil # (HT): 0.2 10 3/uL — NL (ref 0.0–0.5)
Eosinophil % (HT): 3 % — NL (ref 0–7)
Hematocrit (HT): 39 % — NL (ref 34–47)
Hemoglobin (HGB) (HT): 12.5 g/dL — NL (ref 11.5–16.0)
Lymphocyte # (HT): 2.8 10 3/uL — NL (ref 0.9–3.8)
Lymphocyte % (HT): 31 % — NL (ref 17–44)
MCHC (HT): 32.4 g/dL — NL (ref 32.0–36.0)
MCV (HT): 82 fL — NL (ref 81.0–99.0)
Mean Corpuscular Hemoglobin (MCH) (HT): 26.5 pg — NL (ref 26.0–34.0)
Monocyte # (HT): 0.5 10 3/uL — NL (ref 0.2–1.0)
Monocyte % (HT): 6 % — NL (ref 4–12)
Neutrophil # (HT): 5.5 10 3/uL — NL (ref 1.5–7.7)
Platelets (HT): 315 10 3/uL — NL (ref 140–400)
RBC (HT): 4.71 10 6/uL — NL (ref 3.80–5.20)
RDW (HT): 13.1 % — NL (ref 11.5–15.0)
Seg Neut % (HT): 60 % — NL (ref 40–75)
WBC (HT): 9.1 10 3/uL — NL (ref 4.0–10.8)

## 2019-09-24 ENCOUNTER — Other Ambulatory Visit: Payer: Self-pay

## 2019-09-24 MED ORDER — LOSARTAN POTASSIUM 25 MG PO TABS: 25.0000 mg | ORAL_TABLET | Freq: Every day | ORAL | 0 refills | Status: DC

## 2019-11-03 ENCOUNTER — Encounter: Payer: Self-pay | Admitting: Emergency Medicine

## 2019-11-03 ENCOUNTER — Emergency Department
Admission: EM | Admit: 2019-11-03 | Discharge: 2019-11-03 | Disposition: A | Discharge status: Other Acute Inpatient Hospital | Payer: Medicaid Other | Source: Ambulatory Visit | Attending: Emergency Medicine | Admitting: Emergency Medicine

## 2019-11-03 ENCOUNTER — Emergency Department: Payer: Medicaid Other | Admitting: Radiology

## 2019-11-03 ENCOUNTER — Observation Stay
Admission: EM | Admit: 2019-11-03 | Discharge: 2019-11-04 | Disposition: A | Discharge status: Home / Self Care | Payer: Medicaid Other | Source: Other Acute Inpatient Hospital | Attending: Family Medicine | Admitting: Family Medicine

## 2019-11-03 DIAGNOSIS — R109 Unspecified abdominal pain: Secondary | ICD-10-CM

## 2019-11-03 DIAGNOSIS — K5732 Diverticulitis of large intestine without perforation or abscess without bleeding: Secondary | ICD-10-CM | POA: Insufficient documentation

## 2019-11-03 DIAGNOSIS — R5383 Other fatigue: Secondary | ICD-10-CM

## 2019-11-03 DIAGNOSIS — K5792 Diverticulitis of intestine, part unspecified, without perforation or abscess without bleeding: Secondary | ICD-10-CM

## 2019-11-03 DIAGNOSIS — R509 Fever, unspecified: Secondary | ICD-10-CM

## 2019-11-03 DIAGNOSIS — R1084 Generalized abdominal pain: Secondary | ICD-10-CM | POA: Insufficient documentation

## 2019-11-03 DIAGNOSIS — R638 Other symptoms and signs concerning food and fluid intake: Secondary | ICD-10-CM

## 2019-11-03 DIAGNOSIS — Z20822 Contact with and (suspected) exposure to covid-19: Secondary | ICD-10-CM | POA: Insufficient documentation

## 2019-11-03 DIAGNOSIS — G4700 Insomnia, unspecified: Secondary | ICD-10-CM | POA: Insufficient documentation

## 2019-11-03 LAB — CBC AND DIFFERENTIAL
Baso # K/uL: 0 10*3/uL (ref 0.0–0.1)
Basophil %: 0.2 %
Eos # K/uL: 0.1 10*3/uL (ref 0.0–0.4)
Eosinophil %: 0.7 %
Hematocrit: 42 % (ref 34–45)
Hemoglobin: 13.4 g/dL (ref 11.2–15.7)
Lymph # K/uL: 1.9 10*3/uL (ref 1.2–3.7)
Lymphocyte %: 14 %
MCH: 27 pg (ref 26–32)
MCHC: 32 g/dL (ref 32–36)
MCV: 85 fL (ref 79–95)
Mono # K/uL: 0.9 10*3/uL (ref 0.2–0.9)
Monocyte %: 6.4 %
Neut # K/uL: 10.4 10*3/uL — ABNORMAL HIGH (ref 1.6–6.1)
Platelets: 299 10*3/uL (ref 160–370)
RBC: 4.9 MIL/uL (ref 3.9–5.2)
RDW: 13.8 % (ref 11.7–14.4)
Seg Neut %: 78.7 %
WBC: 13.2 10*3/uL — ABNORMAL HIGH (ref 4.0–10.0)

## 2019-11-03 LAB — BASIC METABOLIC PANEL
Anion Gap: 13 (ref 7–16)
CO2: 21 mmol/L (ref 20–28)
Calcium: 9.2 mg/dL (ref 8.6–10.2)
Chloride: 104 mmol/L (ref 96–108)
Creatinine: 0.74 mg/dL (ref 0.51–0.95)
GFR,Black: 107 *
GFR,Caucasian: 93 *
Glucose: 112 mg/dL — ABNORMAL HIGH (ref 60–99)
Lab: 12 mg/dL (ref 6–20)
Potassium: 4.6 mmol/L (ref 3.3–5.1)
Sodium: 138 mmol/L (ref 133–145)

## 2019-11-03 LAB — POCT URINE PREGNANCY
Lot #: 162956
Preg Test,UR POC: NEGATIVE

## 2019-11-03 LAB — COVID-19 PCR

## 2019-11-03 LAB — PERFORMING LAB

## 2019-11-03 LAB — URINALYSIS, DIPSTICK ONLY (STRONG WEST)
Blood,UA: NEGATIVE
Leuk Esterase,UA: NEGATIVE
Nitrite,UA: NEGATIVE
Specific Gravity,UA: >=1.03 (ref 1.002–1.030)
pH,UA: 5.5 (ref 5.0–8.0)

## 2019-11-03 LAB — RUQ PANEL (ED ONLY)
ALT: 23 U/L (ref 0–35)
AST: 25 U/L (ref 0–35)
Albumin: 4 g/dL (ref 3.5–5.2)
Alk Phos: 123 U/L — ABNORMAL HIGH (ref 35–105)
Amylase: 47 U/L (ref 28–100)
Bilirubin,Direct: <0.2 mg/dL (ref 0.0–0.3)
Bilirubin,Total: 0.5 mg/dL (ref 0.0–1.2)
Lipase: 25 U/L (ref 13–60)
Total Protein: 7.2 g/dL (ref 6.3–7.7)

## 2019-11-03 LAB — COVID-19 NAAT (PCR): COVID-19 NAAT (PCR): NEGATIVE

## 2019-11-03 MED ORDER — CIPROFLOXACIN IN D5W 400 MG/200ML IV SOLN *I*
400.0000 mg | Freq: Once | INTRAVENOUS | Status: AC
Start: 2019-11-03 — End: 2019-11-03
  Administered 2019-11-03: 400 mg via INTRAVENOUS
  Filled 2019-11-03: qty 200

## 2019-11-03 MED ORDER — ACETAMINOPHEN 325 MG PO TABS *I*
650.0000 mg | ORAL_TABLET | Freq: Once | ORAL | Status: AC
Start: 2019-11-03 — End: 2019-11-03
  Administered 2019-11-03: 650 mg via ORAL
  Filled 2019-11-03: qty 2

## 2019-11-03 MED ORDER — SODIUM CHLORIDE 0.9 % IV BOLUS *I*
1000.0000 mL | Freq: Once | Status: AC
Start: 2019-11-03 — End: 2019-11-03
  Administered 2019-11-03: 1000 mL via INTRAVENOUS

## 2019-11-03 MED ORDER — STERILE WATER FOR IRRIGATION IR SOLN *I*
900.0000 mL | Freq: Once | Status: AC
Start: 2019-11-03 — End: 2019-11-03
  Administered 2019-11-03: 900 mL via ORAL

## 2019-11-03 MED ORDER — TRAZODONE HCL 50 MG PO TABS *I*
100.0000 mg | ORAL_TABLET | Freq: Every evening | ORAL | Status: DC | PRN
Start: 2019-11-03 — End: 2019-11-04

## 2019-11-03 MED ORDER — SODIUM CHLORIDE 0.9 % IV SOLN WRAPPED *I*
125.0000 mL/h | Status: DC
Start: 2019-11-03 — End: 2019-11-04
  Administered 2019-11-03: 125 mL/h via INTRAVENOUS
  Administered 2019-11-03: 125 mL/h
  Administered 2019-11-03: 125 mL/h via INTRAVENOUS
  Administered 2019-11-03 – 2019-11-04 (×4): 125 mL/h
  Administered 2019-11-04 (×2): 125 mL/h via INTRAVENOUS

## 2019-11-03 MED ORDER — METRONIDAZOLE IN NACL 5 MG/ML IV SOLUTION *WRAPPED*
500.0000 mg | Freq: Three times a day (TID) | INTRAVENOUS | Status: DC
Start: 2019-11-04 — End: 2019-11-04
  Administered 2019-11-04 (×2): 500 mg via INTRAVENOUS
  Filled 2019-11-03 (×2): qty 100

## 2019-11-03 MED ORDER — ACETAMINOPHEN 325 MG PO TABS *I*
650.0000 mg | ORAL_TABLET | Freq: Four times a day (QID) | ORAL | Status: DC | PRN
Start: 2019-11-03 — End: 2019-11-04
  Administered 2019-11-03 – 2019-11-04 (×2): 650 mg via ORAL
  Filled 2019-11-03 (×2): qty 2

## 2019-11-03 MED ORDER — METRONIDAZOLE IN NACL 5 MG/ML IV SOLUTION *WRAPPED*
500.0000 mg | Freq: Once | INTRAVENOUS | Status: AC
Start: 2019-11-03 — End: 2019-11-03
  Administered 2019-11-03: 500 mg via INTRAVENOUS
  Filled 2019-11-03: qty 100

## 2019-11-03 MED ORDER — ONDANSETRON HCL 2 MG/ML IV SOLN *I*
4.0000 mg | Freq: Four times a day (QID) | INTRAMUSCULAR | Status: DC | PRN
Start: 2019-11-03 — End: 2019-11-04

## 2019-11-03 MED ORDER — PROMETHAZINE HCL 12.5 MG PO TABS *I*
25.0000 mg | ORAL_TABLET | ORAL | Status: DC | PRN
Start: 2019-11-03 — End: 2019-11-04

## 2019-11-03 MED ORDER — CIPROFLOXACIN IN D5W 400 MG/200ML IV SOLN *I*
400.0000 mg | Freq: Two times a day (BID) | INTRAVENOUS | Status: DC
Start: 2019-11-04 — End: 2019-11-04
  Administered 2019-11-04: 400 mg via INTRAVENOUS
  Filled 2019-11-03: qty 200

## 2019-11-03 MED ORDER — OXYCODONE HCL 5 MG PO TABS *I*
5.0000 mg | ORAL_TABLET | ORAL | Status: DC | PRN
Start: 2019-11-03 — End: 2019-11-04

## 2019-11-03 MED ORDER — IOHEXOL 350 MG/ML (OMNIPAQUE) IV SOLN *I*
1.0000 mL | Freq: Once | INTRAVENOUS | Status: AC
Start: 2019-11-03 — End: 2019-11-03
  Administered 2019-11-03: 149 mL via INTRAVENOUS

## 2019-11-03 MED ORDER — METRONIDAZOLE IN NACL 5 MG/ML IV SOLUTION *WRAPPED*
500.0000 mg | Freq: Three times a day (TID) | INTRAVENOUS | Status: DC
Start: 2019-11-03 — End: 2019-11-03

## 2019-11-03 MED ORDER — HYDROMORPHONE HCL PF 1 MG/ML IJ SOLN *WRAPPED*
0.5000 mg | INTRAMUSCULAR | Status: DC | PRN
Start: 2019-11-03 — End: 2019-11-04

## 2019-11-03 NOTE — ED Provider Notes (Addendum)
History     Chief Complaint   Patient presents with    Abdominal Pain     Loretta [name] is a 54 y.o. female with a PMH significant for anxiety, s/p appendectomy, cholecystectomy, and tubal ligation who presents to the emergency department with abdominal discomfort x 2 days.     The patient states that she last felt well when going to sleep 7/9. She states that yesterday 7/10 she woke to abdominal pain in the lower abdomen. Throughout the day, the patient states that her pain persisted, and now, she notes that she has pain throughout the abdomen into the upper regions. The patient denies nausea, or vomiting. She states that she had a bowel movement this morning that was normal, without blood and not loose.     The patient otherwise denies chest pain, shortness of breath, dizziness, headache, lightheadedness, congestion, sore throat or rhinorrhea. She denies vaginal bleeding or discharge. The patient denies history of similar pain in the past.         Medical/Surgical/Family History     Past Medical History:   Diagnosis Date    Anxiety         Patient Active Problem List   Diagnosis Code    Seasonal allergic rhinitis J30.2            Past Surgical History:   Procedure Laterality Date    APPENDECTOMY      CHOLECYSTECTOMY      PR ARTHRS KNE SURG W/MENISCECTOMY MED/LAT W/SHVG Right 04/03/2018    Procedure: ARTHROSCOPY, KNEE, WITH MENISCECTOMY;  Surgeon: Bo Merino, MD;  Location: [facility];  Service: Orthopedics    TUBAL LIGATION       Family History   Problem Relation Age of Onset    High Blood Pressure Mother     Cataracts Mother     Depression Mother     Arthritis Mother     Diabetes Mother     Neuromuscular disorder Mother     Neuromuscular disorder Sister     Arthritis Sister     Depression Sister     Diabetes Sister     High Blood Pressure Sister           Social History     Tobacco Use    Smoking status: Never Smoker    Smokeless tobacco: Never Used   Substance Use Topics     Alcohol use: Yes     Comment: Socially     Drug use: Never     Living Situation     Questions Responses    Patient lives with Family    Homeless No    Caregiver for other family member No    External Services None    Employment Employed    Domestic Violence Risk No            Review of Systems   Review of Systems   Constitutional: Positive for appetite change (decreased) and fatigue. Negative for activity change, chills and fever.   HENT: Negative for congestion, rhinorrhea and sore throat.    Respiratory: Negative for cough, chest tightness and shortness of breath.    Cardiovascular: Negative for chest pain and palpitations.   Gastrointestinal: Positive for abdominal pain. Negative for blood in stool, diarrhea, nausea and vomiting.   Genitourinary: Negative for dysuria, frequency and urgency.   Musculoskeletal: Negative for arthralgias and myalgias.   Skin: Negative for color change, pallor, rash and wound.   Neurological: Negative for dizziness, light-headedness  and headaches.   Psychiatric/Behavioral: Negative for agitation and confusion. The patient is not nervous/anxious.      Physical Exam     Triage Vitals  Triage Start: Start, (11/03/19 1058)   First Recorded BP: (!) 188/95, Resp: 18, Temp: 37.9 C (100.2 F), Temp src: Oral Oxygen Therapy SpO2: 96 %, O2 Device: None (Room air), Heart Rate: (!) 114, (11/03/19 1100)  .  First Pain Reported  0-10 Scale: 8, Pain Location/Orientation: Abdomen, (11/03/19 1100)     Physical Exam  Vitals and nursing note reviewed.   Constitutional:       General: She is not in acute distress.     Appearance: She is well-developed and normal weight. She is ill-appearing. She is not toxic-appearing or diaphoretic.   HENT:      Head: Normocephalic and atraumatic.   Eyes:      Extraocular Movements: Extraocular movements intact.   Cardiovascular:      Rate and Rhythm: Normal rate and regular rhythm.      Heart sounds: Normal heart sounds.   Pulmonary:      Effort: Pulmonary effort is  normal.      Breath sounds: Normal breath sounds. No wheezing, rhonchi or rales.   Chest:      Chest wall: No tenderness.   Abdominal:      General: Abdomen is flat. Bowel sounds are normal.      Palpations: Abdomen is soft.      Tenderness: There is generalized abdominal tenderness and tenderness in the right lower quadrant, suprapubic area and left lower quadrant.   Skin:     General: Skin is warm and dry.      Coloration: Skin is not pale.      Findings: No erythema or rash.   Neurological:      Mental Status: She is alert and oriented to person, place, and time.   Psychiatric:         Mood and Affect: Mood normal. Mood is not anxious or depressed.         Behavior: Behavior normal.       Medical Decision Making   Patient seen by me on:  11/03/2019    Assessment:  Loretta [name] is a 54 y.o. female with a PMH significant for anxiety, s/p appendectomy, cholecystectomy, and tubal ligation who presents to the emergency department with abdominal discomfort x 2 days. States that yesterday she woke with pain in the lower abdomen, which worsened and became more diffuse throughout the day. Patient denies complaints other than pain. Endorses normal bowel movements. Physical exam notable for well appearing female In bed. Cardiopulmonary exam unremarkable. Abdomen soft, diffusely tender, bowel sounds normal. Patient appears comfortable in no acute distress at time of exam.    Differential diagnosis:  Gastritis, gastroenteritis, dehydration, electrolyte abnormality, diverticulitis, colitis    Plan:  Orders Placed This Encounter      CT abdomen and pelvis with contrast      CBC and differential      Basic metabolic panel      RUQ panel      Urinalysis, dipstick only ([facility] [place])      POCT urine pregnancy      Insert peripheral IV    Independent review of: chart/prior records    Update:   Recent Results (from the past 24 hour(s))   CBC and differential    Collection Time: 11/03/19 11:25 AM   Result Value Ref Range    WBC 13.2  (  H) 4.0 - 10.0 THOU/uL    RBC 4.9 3.90 - 5.20 MIL/uL    Hemoglobin 13.4 11.2 - 15.7 g/dL    Hematocrit 42 34.00 - 45.00 %    MCV 85 79.0 - 95.0 fL    MCH 27 26 - 32 pg    MCHC 32 32 - 36 g/dL    RDW 13.8 11.7 - 14.4 %    Platelets 299 160 - 370 THOU/uL    Seg Neut % 78.7 %    Lymphocyte % 14.0 %    Monocyte % 6.4 %    Eosinophil % 0.7 %    Basophil % 0.2 %    Neut # K/uL 10.4 (H) 1.6 - 6.1 THOU/uL    Lymph # K/uL 1.9 1.2 - 3.7 THOU/uL    Mono # K/uL 0.9 0.2 - 0.9 THOU/uL    Eos # K/uL 0.1 0.0 - 0.4 THOU/uL    Baso # K/uL 0.0 0.0 - 0.1 THOU/uL   Basic metabolic panel    Collection Time: 11/03/19 11:25 AM   Result Value Ref Range    Glucose 112 (H) 60 - 99 mg/dL    Sodium 138 133 - 145 mmol/L    Potassium 4.6 3.3 - 5.1 mmol/L    Chloride 104 96 - 108 mmol/L    CO2 21 20 - 28 mmol/L    Anion Gap 13 7 - 16    UN 12 6 - 20 mg/dL    Creatinine 0.74 0.51 - 0.95 mg/dL    GFR,Caucasian 93 *    GFR,Black 107 *    Calcium 9.2 8.6 - 10.2 mg/dL   RUQ panel    Collection Time: 11/03/19 11:25 AM   Result Value Ref Range    Amylase 47 28 - 100 U/L    Lipase 25 13 - 60 U/L    Total Protein 7.2 6.3 - 7.7 g/dL    Albumin 4.0 3.5 - 5.2 g/dL    Bilirubin,Total 0.5 0.0 - 1.2 mg/dL    Bili,Indirect see below 0.1 - 1.0 mg/dL    Bilirubin,Direct <0.2 0.0 - 0.3 mg/dL    Alk Phos 123 (H) 35 - 105 U/L    AST 25 0 - 35 U/L    ALT 23 0 - 35 U/L   POCT urine pregnancy    Collection Time: 11/03/19 11:30 AM   Result Value Ref Range    Preg Test,UR POC Negative Negative-Dilute urine specimens may cause false negative urine pregnancy results...    INTERNAL CONTROL POCT URINE PREGNANCY *Yes-internal procedural control(s) acceptable     Exp date 01/22/21     Lot # [id number]    Urinalysis, dipstick only Jacqulyn Liner Melina [place])    Collection Time: 11/03/19 11:31 AM   Result Value Ref Range    Color, UA Yellow Yellow    Appearance,UR Clear Clear    Glucose,UA NORM mg/dL    Ketones, UA 1+ (!) NEGATIVE    Specific Gravity,UA >=1.030 1.002 - 1.030    Blood,UA NEG NEGATIVE     pH,UA 5.5 5.0 - 8.0    Protein,UA TRACE NEGATIVE mg/dL    Nitrite,UA NEG NEGATIVE    Leuk Esterase,UA NEG NEGATIVE     -- Called to patient room for small bowel movement with minimal blood in toilet    Update:     CT abdomen and pelvis with contrast  Result Date: 0/45/4098  Acute uncomplicated sigmoid colonic diverticulitis.     -- Discussed results with patient    --  Patient discussed with ED physician Dr. Marlow Baars    -- Will give IV Cipro / Flagyl and transfer to ED observation unit for further evaluation and antibiosis    -- Patient expressed understanding and agreement with plan of care        Vania Rea, [place]  11/03/19 4:15 PM     APP Review:    I had face-to-face interaction with the patient on 11/03/2019.    I was asked by APP to see this patient due to the complexity of the current medical presentation.      I have reviewed and agree with the above documentation and, in addition:    Our plan is Uncomplicated diverticulitis.  Will [facility] to [facility] for IV antibiotics and symptom control.        Author:  Karlyn Agee, MD       Robynn Pane [place], [place]  11/03/19 1615       Karlyn Agee, MD  11/05/19 [phone]

## 2019-11-03 NOTE — ED Notes (Signed)
[facility] for an update as to the location of the ambulance, states will arrive shortly.

## 2019-11-03 NOTE — ED Notes (Signed)
Awaiting [facility] to transfer pt to [facility] ED Obs unit. Pt resting comfortably watching television with spouse at bedside.

## 2019-11-03 NOTE — ED Notes (Signed)
Assumed are of patient at this time. Patient resting in bed, in NAD, c/o abdominal pain 6/10, HT 117, no fever. IVF infusing per order. Will obtain covid swab and will treat patient per orders.

## 2019-11-03 NOTE — ED Notes (Signed)
Pt arrived to EOU1 alert and oriented x4, pt ambulated independently from stretcher to bed with a steady gait. Pt complaining of pain 5/10 in abdomen, refused oxycodone at this time. Pt complaining of headache, tylenol give per MAR. Pt oriented to unit, IVF infusing, updated on plan of care and resting comfortably in bed. Will continue to monitor and treat per provider orders.

## 2019-11-03 NOTE — ED Notes (Signed)
Pt c/o headache: provider aware. Drinking contrast per plan of care. Will continue to monitor.

## 2019-11-03 NOTE — ED Notes (Signed)
Denies nausea, denies diarrhea, denies radiating pain to/from back. Pt describes pain as constantly there with intermit period of more severe pain. Pt noted to have intermit severe pain bouts during assessment. Pt denies medication PTA. Pt denies urinary symptoms. Pt states she slept most of the day yesterday.    Plan of Care: Pt verbalizes understanding of plan of care. Will address any orders for medication and interventions as they are placed. Call bell within reach, pt oriented to room. Will monitor patient's pain and comfort levels as needed. Will continue to monitor and support.

## 2019-11-03 NOTE — Discharge Instructions (Signed)
You were seen and evaluated in the emergency department today for Abdominal pain. You had lab work and a CT scan which showed evidence of an intraabdominal infection, known as diverticulitis.    You have been started on IV antibiotics and are being transferred to the [facility] ED Observation unit    Please present immediately to [facility] - do not stop for any reason en route to the hospital

## 2019-11-03 NOTE — ED Notes (Signed)
11/03/19 2014   Observation Care   Observation care initiated  Yes   Patient has been verbally notified of their observation status Yes

## 2019-11-03 NOTE — ED Obs Notes (Signed)
ED OBSERVATION ADMISSION NOTE    Patient seen by me 11/03/2019 at 8:10 PM     Current patient status: Observation    History     Chief Complaint   Patient presents with    Abdominal Pain     PATIENT ACCEPTED IN TRANSFER FROM [facility] FOR OBSERVATION CARE.    54 year old female with no significant medical history who presented to [facility] with abdominal pain. She reports onset of pain yesterday morning and states it has been constant since. Pain is located diffusely across her low abdomen and radiates to her upper abdomen, is achy in nature with occasional tightening. She admits to low grade fevers but denies nausea or vomiting. She had a single episode of loose stool at [facility] but is unsure if there was blood. Her pain is worse with movement, and she did not take anything for her pain prior to arrival in the ED. She was able to eat dinner last night without change in her symptoms but has not ate yet today. She denies a history of similar symptoms in the past. She has never had a colonoscopy.         History provided by:  Patient  Language interpreter used: No    Abdominal Pain  Pain location:  Generalized (worse across lower abdomen)  Pain quality: aching    Pain quality comment:  Tightening  Duration:  2 days  Timing:  Constant  Chronicity:  New  Context: previous surgery (appendectomy, cholecystectomy, tubal ligation )    Relieved by:  None tried  Worsened by:  Movement  Ineffective treatments:  None tried  Associated symptoms: diarrhea and fever    Associated symptoms: no chest pain, no chills, no cough, no dysuria, no nausea, no shortness of breath, no sore throat and no vomiting      Past Medical History:   Diagnosis Date    Anxiety      Past Surgical History:   Procedure Laterality Date    APPENDECTOMY      CHOLECYSTECTOMY      PR ARTHRS KNE SURG W/MENISCECTOMY MED/LAT W/SHVG Right 04/03/2018    Procedure: ARTHROSCOPY, KNEE, WITH MENISCECTOMY;  Surgeon: Bo Merino, MD;  Location: [facility]  OR;  Service: Orthopedics    TUBAL LIGATION       Family History   Problem Relation Age of Onset    High Blood Pressure Mother     Cataracts Mother     Depression Mother     Arthritis Mother     Diabetes Mother     Neuromuscular disorder Mother     Neuromuscular disorder Sister     Arthritis Sister     Depression Sister     Diabetes Sister     High Blood Pressure Sister      Social History      reports that she has never smoked. She has never used smokeless tobacco. She reports current alcohol use. She reports that she does not use drugs. No history on file for sexual activity.    Living Situation     Questions Responses    Patient lives with Family    Homeless No    Caregiver for other family member No    External Services None    Employment Employed    Domestic Violence Risk No        Review of Systems   Review of Systems   Constitutional: Positive for fever. Negative for chills.   HENT:  Negative for congestion, rhinorrhea and sore throat.    Eyes: Negative for visual disturbance.   Respiratory: Negative for cough and shortness of breath.    Cardiovascular: Negative for chest pain.   Gastrointestinal: Positive for abdominal pain and diarrhea. Negative for nausea and vomiting.   Genitourinary: Negative for difficulty urinating and dysuria.   Musculoskeletal: Negative for arthralgias and myalgias.   Skin: Negative for rash.   Allergic/Immunologic: Negative for immunocompromised state.   Neurological: Positive for headaches. Negative for dizziness and light-headedness.     Physical Exam   There were no vitals taken for this visit.    Physical Exam  Vitals and nursing note reviewed.   HENT:      Head: Normocephalic and atraumatic.   Eyes:      Conjunctiva/sclera: Conjunctivae normal.   Cardiovascular:      Rate and Rhythm: Normal rate and regular rhythm.   Pulmonary:      Effort: Pulmonary effort is normal. No respiratory distress.      Breath sounds: Normal breath sounds. No wheezing.   Abdominal:       General: Bowel sounds are normal. There is no distension.      Palpations: Abdomen is soft.      Tenderness: There is abdominal tenderness (diffuse tenderness to palpation, worse in LLQ). There is no guarding.   Musculoskeletal:      Right lower leg: No edema.      Left lower leg: No edema.   Skin:     General: Skin is warm and dry.   Neurological:      General: No focal deficit present.      Mental Status: She is alert.   Psychiatric:         Mood and Affect: Mood normal.         Behavior: Behavior normal.       Tests   EKG: n/a     Labs:   WBC 13.2*   Hemoglobin 13.4   Hematocrit 42   Platelets 299     Sodium 138   Potassium 4.6   Chloride 104   CO2 21   Anion Gap 13   UN 12   Creatinine 0.74   GFR,Black 107   GFR,Caucasian 93   GLUCOSE 112 (H)   Calcium 9.2   Total Protein 7.2   Albumin 4.0   ALT 23   AST 25   Alk Phos 123 (H)   Amylase 47   Bilirubin,Direct <0.2   Bilirubin,Total 0.5   Lipase 25     poct urine pregnancy negative   UA notable for 1+ ketones     Imaging:    CT abdomen and pelvis with contrast  Acute uncomplicated sigmoid colonic diverticulitis.     Medical Decision Making      Amount and/or Complexity of Data Reviewed  Clinical lab tests: ordered and reviewed  Tests in the radiology section of CPT: reviewed      Assessment:    54 y.o. female with no significant medical history placed in OBS after evaluation at [facility] for abdominal pain. Work up included labs that were notable for WBC 13.2. CT A/P revealed acute uncomplicated sigmoid diverticulitis. She was treated with IV Cipro and Flagyl and was transferred here for ongoing treatment.     Differential Diagnosis includes diverticulitis, viral illness, dehydration                   Plan:   1. Acute uncomplicated sigmoid  diverticulitis  -Continue IV Cipro q12h and IV Flagyl q8h with plan to transition to PO cipro/flagyl to complete 7-10 day course  -PO Tylenol prn mild pain/fevers, PO OxyIR prn moderate pain, IV dilaudid prn severe pain  -IV  Zofran prn nausea  -Maintenance IVF at 125 cc/hour, clear liquid diet as tolerated   -Repeat CBC in the morning     2. Insomnia  -Continue Trazodone nightly prn     Medically preferred DVT prophylaxis: None  Smoking Cessation: NA  Code Status: Full  Disposition Barriers: None identified   Covid-19 Status: pending     Regino Schultze, PA     Shellia Cleverly [place], [place]  11/03/19 2035

## 2019-11-03 NOTE — ED Notes (Signed)
Plan of Care     VS q4h  Monitor for fever  NS at 125 ml/hr  IV Flagyl and Cipro  Pain management  Am labs  Comfort measures  Clears as tolerated  Observation

## 2019-11-03 NOTE — ED Triage Notes (Signed)
Triage Note   Patient presents with generalized abdominal pain that started in her lower/pelvic yesterday. 100.5F orally at triage. Nothing taken for pain today. Was seen at [facility] and sent here for further workup, urine was clear, Hx cholecystectomy, appendectomy and tubal ligation. Last menses 05/2019, peri. Normal BMs. No n/v  Gabriel Carina, RN

## 2019-11-04 LAB — CBC AND DIFFERENTIAL
Baso # K/uL: 0 10*3/uL (ref 0.0–0.1)
Basophil %: 0.3 %
Eos # K/uL: 0.2 10*3/uL (ref 0.0–0.4)
Eosinophil %: 2 %
Hematocrit: 37 % (ref 34–45)
Hemoglobin: 11.7 g/dL (ref 11.2–15.7)
IMM Granulocytes #: 0.1 10*3/uL — ABNORMAL HIGH (ref 0.0–0.0)
IMM Granulocytes: 0.6 %
Lymph # K/uL: 2.1 10*3/uL (ref 1.2–3.7)
Lymphocyte %: 21.8 %
MCH: 27 pg (ref 26–32)
MCHC: 32 g/dL (ref 32–36)
MCV: 86 fL (ref 79–95)
Mono # K/uL: 0.8 10*3/uL (ref 0.2–0.9)
Monocyte %: 8 %
Neut # K/uL: 6.3 10*3/uL — ABNORMAL HIGH (ref 1.6–6.1)
Nucl RBC # K/uL: 0 10*3/uL (ref 0.0–0.0)
Nucl RBC %: 0 /100 WBC (ref 0.0–0.2)
Platelets: 262 10*3/uL (ref 160–370)
RBC: 4.3 MIL/uL (ref 3.9–5.2)
RDW: 13.2 % (ref 11.7–14.4)
Seg Neut %: 67.3 %
WBC: 9.4 10*3/uL (ref 4.0–10.0)

## 2019-11-04 MED ORDER — CIPROFLOXACIN HCL 500 MG PO TABS *I*
500.0000 mg | ORAL_TABLET | Freq: Two times a day (BID) | ORAL | 0 refills | Status: AC
Start: 2019-11-04 — End: 2019-11-10

## 2019-11-04 MED ORDER — ONDANSETRON 4 MG PO TBDP *I*
4.0000 mg | ORAL_TABLET | Freq: Three times a day (TID) | ORAL | 0 refills | Status: AC | PRN
Start: 2019-11-04 — End: 2019-11-07

## 2019-11-04 MED ORDER — METRONIDAZOLE 500 MG PO TABS *I*
500.0000 mg | ORAL_TABLET | Freq: Three times a day (TID) | ORAL | 0 refills | Status: AC
Start: 2019-11-04 — End: 2019-11-10

## 2019-11-04 NOTE — ED Notes (Signed)
Plan of Care     Med administration  Pain control  Comfort measures  NS@125   IV abx - cipro and flagyl  Clears diet  Vsq4

## 2019-11-04 NOTE — Discharge Instructions (Signed)
You were admitted directly from SW to OBS Unit for uncomplicated sigmoid diverticulitis. CT abd/pelvis showed uncomplicated sigmoid colonic diverticulitis. You were started on IV cipro and flagyl. Tolerated clears here. WBC improved from 13.2 to 9.4.     Plan:  Following prescriptions sent to her pharmacy  Ciprofloxacin 500 mg twice a day for 6 days to complete 7 day course.   Flagyl 500 mg three times a day for 6 days  to complete 7 day course.  Take it with food and probiotics to minimize the GI discomfort.     Please come back if you have significant pain or not tolerating anything by mouth.     It was nice taking care of you  here Loretta [name]. From Dr.Dyanara Cozza and EOU Team

## 2019-11-04 NOTE — ED Notes (Signed)
Plan of Care     Monitor V/S, pain management, IVF N/S 118ml/hr, IV abx (Flagyl and CIPRO) and clear diet. Medication administration per [facility] and continue to provide exceptional nursing care per orders.

## 2019-11-04 NOTE — ED Notes (Signed)
Assumed care of patient, initial assessment completed. Patient has no complaints at this time. Will treat per [facility] and continue provide exceptional nursing care per orders.

## 2019-11-04 NOTE — ED Notes (Signed)
Assumed care of patient, assessed at this time. Patient c/o abdominal tenderness - but states its much improved from yesterday. C/o diarrhea throughout the morning as well. Denies nausea. Updated on known POC. Will continue to monitor and treat. Call light in reach.

## 2019-11-04 NOTE — ED Notes (Signed)
Discharge paperwork completed with patient. IV removed. VSS. Family to be at patient discharge at 1130. Will request transport

## 2019-11-04 NOTE — ED Obs Notes (Signed)
ED OBSERVATION DISCHARGE NOTE    Patient seen by me today, 11/04/2019 at 10:15 AM.    Current patient status: Observation    Subjective:  54 yr old F who is fairly Loretta [name] was admitted directly from SW for uncomplicated sigmoid diverticulitis. She was started on IV cipro and flagyl. Tolerated her clears here. Says feeling much better. Agreeable to go home.     Observation Stay Includes:  54 y.o.female who presented to the ED with   Chief Complaint   Patient presents with    Abdominal Pain       Last Nursing documented pain:  0-10 Scale: 3 (11/04/19 0920)      Vitals:    Patient Vitals for the past 24 hrs:   BP Temp Temp src Pulse Resp SpO2   11/04/19 0854 135/63 35.9 C (96.6 F) TEMPORAL 101 18 97 %   11/04/19 0605 132/69 36.7 C (98.1 F) Oral 91 -- 96 %   11/04/19 0045 132/69 35.6 C (96.1 F) TEMPORAL 84 16 97 %   11/03/19 2048 130/80 36 C (96.8 F) TEMPORAL (!) 117 16 94 %         Physical Exam:  Physical Exam  Constitutional:       Appearance: She is well-developed. She is obese.   Cardiovascular:      Rate and Rhythm: Normal rate and regular rhythm.   Pulmonary:      Effort: Pulmonary effort is normal.      Breath sounds: Normal breath sounds.   Abdominal:      General: Bowel sounds are normal.      Palpations: Abdomen is soft.      Tenderness: There is generalized abdominal tenderness. There is no guarding or rebound.   Skin:     General: Skin is warm.   Neurological:      Mental Status: She is alert.   Psychiatric:         Mood and Affect: Mood normal.       EKG: None w/ this admission    Labs:   Recent Labs   Lab 11/04/19  0612 11/03/19  1125   WBC 9.4 13.2*   Hemoglobin 11.7 13.4   Hematocrit 37 42   Platelets 262 299         Lab results: 11/03/19  1125   Creatinine 0.74     Imaging findings: CT abdomen and pelvis with contrast    Result Date: 11/03/2019  11/03/2019 2:00 PM CT ABDOMEN AND PELVIS WITH CONTRAST CLINICAL INFORMATION:  Abdominal pain, acute, nonlocalized, diffuse abdominal pain, leukocytosis,  s/p cholecystectomy and appendectomy. COMPARISON:  None. PROCEDURE:  Contiguous images were obtained through the abdomen and pelvis with intravenous contrast. Automated exposure control, adjustment of the mA and/or kV according to patient size, and/or iterative reconstruction techniques were utilized for radiation dose optimization. Amount and type of contrast that was injected and/or discarded is recorded in the electronic medical record. FINDINGS: Lung Bases: Visualized lung bases are clear. Visualized heart is normal in size without significant pericardial effusion. Mild wall thickening of the distal esophagus probably reflux esophagitis. Stomach: No significant hiatal hernia. Liver: There is decreased attenuation throughout the liver most compatible with hepatic steatosis. Focal fatty infiltration adjacent to the falciform ligament. Portal and hepatic veins appear patent. Mixing artifact in the superior mesentery vein. Spleen: Subcentimeter low-attenuation focus is too small to characterize, most likely benign. Adrenal Glands: No suspicious nodule. Gallbladder and Bile Ducts: No biliary distention status post cholecystectomy. Pancreas:  No parenchymal atrophy or ductal dilatation. Right Kidney: No radiopaque stone or hydronephrosis. Subcentimeter low-attenuation foci are too small to characterize. Left Kidney: No radiopaque stone or hydronephrosis. Subcentimeter low-attenuation foci are too small to characterize. Vasculature: Mild atherosclerotic calcification. Abdominal aorta and iliac arteries are normal in caliber. Mixing artifact in the inferior vena cava. Small Bowel: The small bowel is not distended. Colon: Sigmoid colonic diverticulosis with wall thickening and adjacent stranding consistent with diverticulitis. No definite focal collection or free air. Additional scattered colonic diverticula. The patient is status post appendectomy. Lymph Nodes: No adenopathy by imaging size criteria. Mild mesenteric  stranding in the pelvis. Bladder: No focal wall thickening or radiopaque stone. Uterus and Adnexa: Uterus is slightly retroflexed. 1.9 cm low-attenuation area in the LEFT adnexa posterior to the uterus may be a fibroid or adnexal low-attenuation lesion. Soft Tissues: Rectus diastasis. Bones: Degenerative changes in the spine. Confluent anterior osteophytes of the lower thoracic spine suggesting the diffuse idiopathic skeletal hyperostosis. No suspicious osseous lesion. Degenerative changes in the bilateral sacroiliac joints. Miscellaneous: No free air. Probably trace pelvic free fluid.     Acute uncomplicated sigmoid colonic diverticulitis. END OF IMPRESSION [facility] submits this DICOM format image data and final report to the [facility], an independent secure electronic health information exchange, on a reciprocally searchable basis (with patient authorization) for a minimum of 12 months after exam date.    Cardiac Testing:  None  Consults: none    Assessment:54 yr old F who is fairly Loretta [name] was admitted directly from SW for uncomplicated sigmoid diverticulitis. CT abd/pelvis showed uncomplicated sigmoid colonic diverticulitis. She was started on IV cipro and flagyl. Tolerated her clears here. WBC improved from 13.2 to 9.4. Agreeable for discharge today.     Plan:  Following prescriptions sent to his pharmacy  Ciprofloxacin 500 mg twice a day for 6 days to complete 7 day course.   Flagyl 500 mg three times a day for 6 days  to complete 7 day course.  Told her to take it with food and probiotics to minimize the GI discomfort.     Disposition: Home  Follow-up:  within the next 2-5 days. with PCP  Smoking Cessation: NA    Diagnoses that have been ruled out:   None   Diagnoses that are still under consideration:   None   Final diagnoses:   Diverticulitis     COVID Negative    Author: Gayland Curry, MD  Note created: 11/04/2019  at: 10:15 AM     Gayland Curry, MD  11/04/19 1022

## 2019-11-15 ENCOUNTER — Encounter: Payer: Self-pay | Admitting: Family Medicine

## 2019-11-20 ENCOUNTER — Ambulatory Visit: Payer: Commercial Managed Care - PPO | Admitting: Family Medicine

## 2019-11-20 ENCOUNTER — Other Ambulatory Visit: Payer: Self-pay

## 2019-11-20 VITALS — BP 136/84 | HR 80 | Temp 97.2°F | Resp 16 | Ht 65.0 in | Wt 184.4 lb

## 2019-11-20 DIAGNOSIS — I10 Essential (primary) hypertension: Secondary | ICD-10-CM | POA: Diagnosis not present

## 2019-11-20 DIAGNOSIS — Z1211 Encounter for screening for malignant neoplasm of colon: Secondary | ICD-10-CM | POA: Diagnosis not present

## 2019-11-20 DIAGNOSIS — E782 Mixed hyperlipidemia: Secondary | ICD-10-CM | POA: Diagnosis not present

## 2019-11-20 MED ORDER — ATORVASTATIN CALCIUM 20 MG PO TABS: 20.0000 mg | ORAL_TABLET | Freq: Every day | ORAL | 0 refills | Status: DC

## 2019-11-20 NOTE — Progress Notes (Signed)
Established Patient Office Visit  Subjective:  Patient ID: Paige Hardy, female    DOB: 1965/11/04  Age: 54 y.o. MRN: [id number]  CC:  Chief Complaint  Patient presents with  . Hyperlipidemia  . Hypertension    HPI Paige Hardy presents for follow-up of hyperlipidemia.  Patient also has hypertension.  Patient has been more diligent about taking her Lipitor 10 mg once daily.  She takes losartan for hypertension.  She denies eating healthy.  She denies exercise.  She has not taken the Covid vaccine but is considering it.  She has not had her colonoscopy since turning 52.  She did have her mammogram in December 2020.  She is overdue to see her gynecologist.  She brought her labs from the hospital.  Her LDL is 131 which is unchanged since her last 1 done in April 2021.  Blood count, liver, kidney function were normal.  Past Medical History:  Diagnosis Date  . Essential hypertension   . Mixed hyperlipidemia   . Vitamin D deficiency     Past Surgical History:  Procedure Laterality Date  . CHOLECYSTECTOMY      Family History  Problem Relation Age of Onset  . Hypertension Other     Social History   Socioeconomic History  . Marital status: Married    Spouse name: Not on file  . Number of children: Not on file  . Years of education: Not on file  . Highest education level: Not on file  Occupational History  . Not on file  Tobacco Use  . Smoking status: Never Smoker  . Smokeless tobacco: Never Used  Substance and Sexual Activity  . Alcohol use: Not Currently  . Drug use: Never  . Sexual activity: Not on file  Other Topics Concern  . Not on file  Social History Narrative  . Not on file   Social Determinants of Health   Financial Resource Strain:   . Difficulty of Paying Living Expenses:   Food Insecurity:   . Worried About Programme researcher, broadcasting/film/video in the Last Year:   . Barista in the Last Year:   Transportation Needs:   . Freight forwarder (Medical):     Marland Kitchen Lack of Transportation (Non-Medical):   Physical Activity:   . Days of Exercise per Week:   . Minutes of Exercise per Session:   Stress:   . Feeling of Stress :   Social Connections:   . Frequency of Communication with Friends and Family:   . Frequency of Social Gatherings with Friends and Family:   . Attends Religious Services:   . Active Member of Clubs or Organizations:   . Attends Banker Meetings:   Marland Kitchen Marital Status:   Intimate Partner Violence:   . Fear of Current or Ex-Partner:   . Emotionally Abused:   Marland Kitchen Physically Abused:   . Sexually Abused:     Outpatient Medications Prior to Visit  Medication Sig Dispense Refill  . atorvastatin (LIPITOR) 10 MG tablet     . losartan (COZAAR) 25 MG tablet Take 1 tablet (25 mg total) by mouth daily. 90 tablet 0  . Vitamin D, Ergocalciferol, (DRISDOL) 1.25 MG (50000 UNIT) CAPS capsule Take 50,000 Units by mouth once a week.     No facility-administered medications prior to visit.    No Known Allergies  ROS Review of Systems  Constitutional: Negative for chills, fatigue and fever.  HENT: Negative for congestion, ear pain and sore  throat.   Respiratory: Negative for cough and shortness of breath.   Cardiovascular: Negative for chest pain and palpitations.  Gastrointestinal: Negative for abdominal pain, constipation and diarrhea.  Genitourinary: Negative for dysuria and urgency.  Musculoskeletal: Negative for arthralgias and back pain.  Neurological: Negative for dizziness and headaches.  Psychiatric/Behavioral: Negative for dysphoric mood. The patient is not nervous/anxious.       Objective:    Physical Exam Constitutional:      Appearance: She is normal weight.  Neck:     Vascular: No carotid bruit.  Cardiovascular:     Heart sounds: Normal heart sounds. No murmur heard.   Pulmonary:     Effort: Pulmonary effort is normal. No respiratory distress.     Breath sounds: Normal breath sounds.  Neurological:      Mental Status: She is alert.  Psychiatric:        Mood and Affect: Mood normal.        Behavior: Behavior normal.     BP (!) 136/84   Pulse 80   Temp (!) 97.2 F (36.2 C)   Resp 16   Ht 5\' 5"  (1.651 m)   Wt 184 lb 6.4 oz (83.6 kg)   BMI 30.69 kg/m  Wt Readings from Last 3 Encounters:  11/20/19 184 lb 6.4 oz (83.6 kg)  08/20/19 185 lb (83.9 kg)     Health Maintenance Due  Topic Date Due  . Hepatitis C Screening  Never done  . COVID-19 Vaccine (1) Never done  . HIV Screening  Never done  . PAP SMEAR-Modifier  Never done  . MAMMOGRAM  Never done  . COLONOSCOPY  Never done    There are no preventive care reminders to display for this patient.  Lab Results  Component Value Date   TSH 2.14 08/14/2019   Lab Results  Component Value Date   WBC 7.2 08/14/2019   HGB 13.3 08/14/2019   HCT 39 08/14/2019   PLT 259 08/14/2019   Lab Results  Component Value Date   NA 139 08/14/2019   K 4.2 08/14/2019   GLUCOSE 104 (A) 08/14/2019   BUN 14 08/14/2019   CREATININE 0.90 08/14/2019   AST 32 08/14/2019   ALT 15 08/14/2019   Lab Results  Component Value Date   CHOL 194 08/14/2019   No results found for: HDL No results found for: [facility] Lab Results  Component Value Date   TRIG 82 08/14/2019   No results found for: CHOLHDL No results found for: 08/16/2019    Assessment & Plan:  1. Essential hypertension Well controlled.  No changes to medicines.  Continue to work on eating a healthy diet and exercise.    2. Mixed hyperlipidemia Not at goal Increase lipitor. Continue to work on eating a healthy diet and exercise.  - atorvastatin (LIPITOR) 20 MG tablet; Take 1 tablet (20 mg total) by mouth daily.  Dispense: 90 tablet; Refill: 0  3. Colon cancer screening - Ambulatory referral to Gastroenterology: needs colonoscopy.  COVID-19 Vaccine Information can be found at: [email or web address] For questions  related to vaccine distribution or appointments, please email vaccine@[email or web address] or call [phone].  Recommend proceed with covid 19 vaccine.  Follow-up: Return in about 3 months (around 02/20/2020).    02/22/2020, MD

## 2019-11-24 ENCOUNTER — Encounter: Payer: Self-pay | Admitting: Family Medicine

## 2019-12-26 ENCOUNTER — Other Ambulatory Visit: Payer: Self-pay | Admitting: Physician Assistant

## 2020-01-26 ENCOUNTER — Other Ambulatory Visit: Payer: Self-pay | Admitting: Family Medicine

## 2020-01-26 DIAGNOSIS — E782 Mixed hyperlipidemia: Secondary | ICD-10-CM

## 2020-02-20 NOTE — Progress Notes (Signed)
Established Patient Office Visit  Subjective:  Patient ID: Paige Hardy, female    DOB: 07/05/1965  Age: 54 y.o. MRN: [id number]  CC:  Chief Complaint  Patient presents with  . Hyperlipidemia  . Hypertension    HPI Paige Hardy presents for follow-up of hyperlipidemia.  Patient also has hypertension.  Patient has been more diligent about taking her Lipitor 20 mg once daily.  Her LDL is 131 which is unchanged since her last 1 done in April 2021.   She takes losartan for hypertension.  She denies eating healthy.  She denies exercise.  She has not had her colonoscopy since turning 52.    She did have her mammogram in December 2020.  She is overdue to see her gynecologist.     Past Medical History:  Diagnosis Date  . Essential hypertension   . Mixed hyperlipidemia   . Vitamin D deficiency     Past Surgical History:  Procedure Laterality Date  . CHOLECYSTECTOMY      Family History  Problem Relation Age of Onset  . Hypertension Other     Social History   Socioeconomic History  . Marital status: Married    Spouse name: Not on file  . Number of children: Not on file  . Years of education: Not on file  . Highest education level: Not on file  Occupational History  . Not on file  Tobacco Use  . Smoking status: Never Smoker  . Smokeless tobacco: Never Used  Substance and Sexual Activity  . Alcohol use: Not Currently  . Drug use: Never  . Sexual activity: Not on file  Other Topics Concern  . Not on file  Social History Narrative  . Not on file   Social Determinants of Health   Financial Resource Strain:   . Difficulty of Paying Living Expenses: Not on file  Food Insecurity:   . Worried About Programme researcher, broadcasting/film/video in the Last Year: Not on file  . Ran Out of Food in the Last Year: Not on file  Transportation Needs:   . Lack of Transportation (Medical): Not on file  . Lack of Transportation (Non-Medical): Not on file  Physical Activity:   . Days of Exercise  per Week: Not on file  . Minutes of Exercise per Session: Not on file  Stress:   . Feeling of Stress : Not on file  Social Connections:   . Frequency of Communication with Friends and Family: Not on file  . Frequency of Social Gatherings with Friends and Family: Not on file  . Attends Religious Services: Not on file  . Active Member of Clubs or Organizations: Not on file  . Attends Banker Meetings: Not on file  . Marital Status: Not on file  Intimate Partner Violence:   . Fear of Current or Ex-Partner: Not on file  . Emotionally Abused: Not on file  . Physically Abused: Not on file  . Sexually Abused: Not on file    Outpatient Medications Prior to Visit  Medication Sig Dispense Refill  . atorvastatin (LIPITOR) 20 MG tablet TAKE 1 TABLET BY MOUTH  DAILY 90 tablet 0  . losartan (COZAAR) 25 MG tablet TAKE ONE (1) TABLET ONCE DAILY 90 tablet 0   No facility-administered medications prior to visit.    No Known Allergies  ROS Review of Systems  Constitutional: Negative for chills, fatigue and fever.  HENT: Negative for congestion, ear pain and sore throat.   Respiratory: Negative  for cough and shortness of breath.   Cardiovascular: Negative for chest pain and palpitations.  Gastrointestinal: Negative for abdominal pain, constipation, diarrhea and nausea.  Genitourinary: Negative for dysuria and urgency.  Musculoskeletal: Negative for arthralgias and back pain.  Neurological: Negative for dizziness and headaches.  Psychiatric/Behavioral: Negative for dysphoric mood. The patient is not nervous/anxious.       Objective:    Physical Exam Constitutional:      Appearance: She is normal weight.  Neck:     Vascular: No carotid bruit.  Cardiovascular:     Heart sounds: Normal heart sounds. No murmur heard.   Pulmonary:     Effort: Pulmonary effort is normal. No respiratory distress.     Breath sounds: Normal breath sounds.  Neurological:     Mental Status: She  is alert.  Psychiatric:        Mood and Affect: Mood normal.        Behavior: Behavior normal.     There were no vitals taken for this visit. Wt Readings from Last 3 Encounters:  11/20/19 184 lb 6.4 oz (83.6 kg)  08/20/19 185 lb (83.9 kg)     Health Maintenance Due  Topic Date Due  . Hepatitis C Screening  Never done  . COVID-19 Vaccine (1) Never done  . HIV Screening  Never done  . PAP SMEAR-Modifier  Never done  . MAMMOGRAM  Never done  . COLONOSCOPY  Never done  . INFLUENZA VACCINE  11/24/2019    There are no preventive care reminders to display for this patient.  Lab Results  Component Value Date   TSH 2.14 08/14/2019   Lab Results  Component Value Date   WBC 7.2 08/14/2019   HGB 13.3 08/14/2019   HCT 39 08/14/2019   PLT 259 08/14/2019   Lab Results  Component Value Date   NA 139 08/14/2019   K 4.2 08/14/2019   GLUCOSE 104 (A) 08/14/2019   BUN 14 08/14/2019   CREATININE 0.90 08/14/2019   AST 32 08/14/2019   ALT 15 08/14/2019   Lab Results  Component Value Date   CHOL 194 08/14/2019   No results found for: HDL No results found for: [facility] Lab Results  Component Value Date   TRIG 82 08/14/2019   No results found for: CHOLHDL No results found for: IPJA2N    Assessment & Plan:  1. Essential hypertension Well controlled.  No changes to medicines.  Continue to work on eating a healthy diet and exercise.   Labs drawn -CBC -CMP   2. Mixed hyperlipidemia Not at goal Continue to work on eating a healthy diet and exercise.  Labs drawn -Lipid  3. Order mammogram  Follow-up: Return in about 3 months (around 05/23/2020) for fasting.    Horald Pollen, CMA

## 2020-02-21 ENCOUNTER — Ambulatory Visit (INDEPENDENT_AMBULATORY_CARE_PROVIDER_SITE_OTHER): Payer: 59 | Admitting: Family Medicine

## 2020-02-21 ENCOUNTER — Other Ambulatory Visit: Payer: Self-pay

## 2020-02-21 ENCOUNTER — Encounter: Payer: Self-pay | Admitting: Family Medicine

## 2020-02-21 VITALS — BP 116/80 | HR 84 | Temp 97.1°F | Resp 18 | Ht 65.0 in | Wt 189.6 lb

## 2020-02-21 DIAGNOSIS — L570 Actinic keratosis: Secondary | ICD-10-CM | POA: Diagnosis not present

## 2020-02-21 DIAGNOSIS — I10 Essential (primary) hypertension: Secondary | ICD-10-CM | POA: Diagnosis not present

## 2020-02-21 DIAGNOSIS — E782 Mixed hyperlipidemia: Secondary | ICD-10-CM

## 2020-02-21 DIAGNOSIS — Z1231 Encounter for screening mammogram for malignant neoplasm of breast: Secondary | ICD-10-CM | POA: Diagnosis not present

## 2020-02-22 LAB — COMPREHENSIVE METABOLIC PANEL
ALT: 21 IU/L (ref 0–32)
AST: 22 IU/L (ref 0–40)
Albumin/Globulin Ratio: 1.9 (ref 1.2–2.2)
Albumin: 4.6 g/dL (ref 3.8–4.9)
Alkaline Phosphatase: 91 IU/L (ref 44–121)
BUN/Creatinine Ratio: 12 (ref 9–23)
BUN: 12 mg/dL (ref 6–24)
Bilirubin Total: 0.5 mg/dL (ref 0.0–1.2)
CO2: 25 mmol/L (ref 20–29)
Calcium: 9.6 mg/dL (ref 8.7–10.2)
Chloride: 103 mmol/L (ref 96–106)
Creatinine, Ser: 0.99 mg/dL (ref 0.57–1.00)
GFR calc Af Amer: 76 mL/min/{1.73_m2} (ref >59)
GFR calc non Af Amer: 66 mL/min/{1.73_m2} (ref >59)
Globulin, Total: 2.4 g/dL (ref 1.5–4.5)
Glucose: 91 mg/dL (ref 65–99)
Potassium: 4.3 mmol/L (ref 3.5–5.2)
Sodium: 143 mmol/L (ref 134–144)
Total Protein: 7 g/dL (ref 6.0–8.5)

## 2020-02-22 LAB — CARDIOVASCULAR RISK ASSESSMENT

## 2020-02-22 LAB — CBC WITH DIFFERENTIAL/PLATELET
Basophils Absolute: 0 10*3/uL (ref 0.0–0.2)
Basos: 1 %
EOS (ABSOLUTE): 0.1 10*3/uL (ref 0.0–0.4)
Eos: 1 %
Hematocrit: 39.7 % (ref 34.0–46.6)
Hemoglobin: 12.8 g/dL (ref 11.1–15.9)
Immature Grans (Abs): 0 10*3/uL (ref 0.0–0.1)
Immature Granulocytes: 0 %
Lymphocytes Absolute: 2.1 10*3/uL (ref 0.7–3.1)
Lymphs: 28 %
MCH: 27.8 pg (ref 26.6–33.0)
MCHC: 32.2 g/dL (ref 31.5–35.7)
MCV: 86 fL (ref 79–97)
Monocytes Absolute: 0.5 10*3/uL (ref 0.1–0.9)
Monocytes: 6 %
Neutrophils Absolute: 4.9 10*3/uL (ref 1.4–7.0)
Neutrophils: 64 %
Platelets: 220 10*3/uL (ref 150–450)
RBC: 4.61 x10E6/uL (ref 3.77–5.28)
RDW: 13.7 % (ref 11.7–15.4)
WBC: 7.6 10*3/uL (ref 3.4–10.8)

## 2020-02-22 LAB — LIPID PANEL
Chol/HDL Ratio: 3 ratio (ref 0.0–4.4)
Cholesterol, Total: 167 mg/dL (ref 100–199)
HDL: 56 mg/dL (ref >39)
LDL Chol Calc (NIH): 99 mg/dL (ref 0–99)
Triglycerides: 63 mg/dL (ref 0–149)
VLDL Cholesterol Cal: 12 mg/dL (ref 5–40)

## 2020-02-27 ENCOUNTER — Encounter: Payer: Self-pay | Admitting: Family Medicine

## 2020-02-27 ENCOUNTER — Telehealth: Payer: Self-pay

## 2020-02-27 NOTE — Telephone Encounter (Signed)
I tried to contact the pt several times at the number listed in the chart. Unfortunately, the call could not be completed at this time. I have mailed a copy of the screening mammogram along with a memo stating that she has been scheduled for her mammogram on 04/20/2020, arrive by 11:00 a.m. for an appointment time at 11:30 a.m. Information was stated on how she can RS the appointment along with information on preparing for her mammogram.

## 2020-03-17 LAB — UNMAPPED LAB RESULTS
Hematocrit (HT): 42 % (ref 35–47)
Hemoglobin (HGB) (HT): 13.1 g/dL (ref 12.0–16.0)
MCHC (HT): 31.2 g/dL (ref 31.0–37.5)
MCV (HT): 85 fL (ref 80–100)
Mean Corpuscular Hemoglobin (MCH) (HT): 26.4 pg (ref 26.0–34.0)
Platelets (HT): 288 10 3/uL (ref 150–450)
RBC (HT): 4.97 10 6/uL (ref 3.80–5.20)
RDW (HT): 13.2 % (ref 0.0–15.2)
WBC (HT): 9.6 10 3/uL (ref 4.0–11.0)

## 2020-04-02 ENCOUNTER — Telehealth (INDEPENDENT_AMBULATORY_CARE_PROVIDER_SITE_OTHER): Payer: 59 | Admitting: Family Medicine

## 2020-04-02 ENCOUNTER — Other Ambulatory Visit: Payer: Self-pay

## 2020-04-02 ENCOUNTER — Other Ambulatory Visit (HOSPITAL_COMMUNITY): Payer: Self-pay | Admitting: Family Medicine

## 2020-04-02 VITALS — BP 156/91 | HR 103 | Temp 98.4°F | Wt 186.9 lb

## 2020-04-02 DIAGNOSIS — J018 Other acute sinusitis: Secondary | ICD-10-CM

## 2020-04-02 DIAGNOSIS — J208 Acute bronchitis due to other specified organisms: Secondary | ICD-10-CM | POA: Diagnosis not present

## 2020-04-02 DIAGNOSIS — E782 Mixed hyperlipidemia: Secondary | ICD-10-CM

## 2020-04-02 MED ORDER — AMOXICILLIN 875 MG PO TABS: 875.0000 mg | ORAL_TABLET | Freq: Two times a day (BID) | ORAL | 0 refills | Status: DC

## 2020-04-02 MED ORDER — GUAIFENESIN-CODEINE 100-10 MG/5ML PO SOLN
5.0000 mL | Freq: Three times a day (TID) | ORAL | 0 refills | Status: DC | PRN
Start: 2020-04-02 — End: 2020-05-25

## 2020-04-02 MED ORDER — HYDROCODONE-HOMATROPINE 5-1.5 MG/5ML PO SYRP
5.0000 mL | ORAL_SOLUTION | Freq: Three times a day (TID) | ORAL | 0 refills | Status: DC | PRN
Start: 2020-04-02 — End: 2020-04-02

## 2020-04-02 MED ORDER — ATORVASTATIN CALCIUM 20 MG PO TABS: 20.0000 mg | ORAL_TABLET | Freq: Every day | ORAL | 0 refills | Status: DC

## 2020-04-02 NOTE — Progress Notes (Signed)
Virtual Visit via Telephone Note   This visit type was conducted due to national recommendations for restrictions regarding the COVID-19 Pandemic (e.g. social distancing) in an effort to limit this patient's exposure and mitigate transmission in our community.  Due to her co-morbid illnesses, this patient is at least at moderate risk for complications without adequate follow up.  This format is felt to be most appropriate for this patient at this time.  The patient did not have access to video technology/had technical difficulties with video requiring transitioning to audio format only (telephone).  All issues noted in this document were discussed and addressed.  No physical exam could be performed with this format.  Patient verbally consented to a telehealth visit.   Patient Location:home Provider Location:office  PCP:  Blane Ohara, MD   Evaluation Performed:  Acute Office Visit  Subjective:    Patient ID: Paige Hardy, female    DOB: Apr 12, 1966, 54 y.o.   MRN: [id number]  Chief Complaint  Patient presents with  . URI    HPI Patient is in today for complaining of congestion sore throat, cough since Monday.  The patient has not been exposed to Covid that she is aware of.  She has had her Covid vaccines but the booster.  Denies fever, chills.  She is tired. Blood pressure is elevated.  Patient denies any decongestants taken.  She has tried Mucinex.  Past Medical History:  Diagnosis Date  . Essential hypertension   . Mixed hyperlipidemia   . Vitamin D deficiency     Past Surgical History:  Procedure Laterality Date  . CHOLECYSTECTOMY     Family History  Problem Relation Age of Onset  . Hypertension Other   . Prostate cancer Father   . Hypertension Maternal Grandfather   . Diabetes Maternal Grandfather     Social History   Socioeconomic History  . Marital status: Married    Spouse name: Not on file  . Number of children: Not on file  . Years of education: Not on  file  . Highest education level: Not on file  Occupational History  . Not on file  Tobacco Use  . Smoking status: Never Smoker  . Smokeless tobacco: Never Used  Substance and Sexual Activity  . Alcohol use: Not Currently  . Drug use: Never  . Sexual activity: Not on file  Other Topics Concern  . Not on file  Social History Narrative  . Not on file   Social Determinants of Health   Financial Resource Strain: Not on file  Food Insecurity: Not on file  Transportation Needs: Not on file  Physical Activity: Not on file  Stress: Not on file  Social Connections: Not on file  Intimate Partner Violence: Not on file    Outpatient Medications Prior to Visit  Medication Sig Dispense Refill  . Ascorbic Acid (VITAMIN C) 1000 MG tablet Take 1,000 mg by mouth daily.    Marland Kitchen losartan (COZAAR) 25 MG tablet TAKE ONE (1) TABLET ONCE DAILY 90 tablet 0  . Multiple Vitamin (MULTIVITAMIN) tablet Take 1 tablet by mouth daily.    Marland Kitchen atorvastatin (LIPITOR) 20 MG tablet TAKE 1 TABLET BY MOUTH  DAILY 90 tablet 0   No facility-administered medications prior to visit.    No Known Allergies  Review of Systems  Constitutional: Positive for fatigue. Negative for chills and fever.  HENT: Positive for sinus pain. Negative for congestion, rhinorrhea and sore throat.   Respiratory: Positive for cough. Negative for shortness  of breath.   Cardiovascular: Negative for chest pain and palpitations.  Gastrointestinal: Negative for abdominal pain, constipation, diarrhea, nausea and vomiting.  Neurological: Negative for dizziness, weakness, light-headedness and headaches.  Psychiatric/Behavioral: Negative for dysphoric mood. The patient is not nervous/anxious.        Objective:    Physical Exam No exam done BP (!) 156/91   Pulse (!) 103   Temp 98.4 F (36.9 C)   Wt 186 lb 14.4 oz (84.8 kg)   SpO2 98%   BMI 31.10 kg/m  Wt Readings from Last 3 Encounters:  04/02/20 186 lb 14.4 oz (84.8 kg)  02/21/20 189  lb 9.6 oz (86 kg)  11/20/19 184 lb 6.4 oz (83.6 kg)    Health Maintenance Due  Topic Date Due  . Hepatitis C Screening  Never done  . HIV Screening  Never done  . PAP SMEAR-Modifier  Never done  . MAMMOGRAM  Never done    There are no preventive care reminders to display for this patient.   Lab Results  Component Value Date   TSH 2.14 08/14/2019   Lab Results  Component Value Date   WBC 7.6 02/21/2020   HGB 12.8 02/21/2020   HCT 39.7 02/21/2020   MCV 86 02/21/2020   PLT 220 02/21/2020   Lab Results  Component Value Date   NA 143 02/21/2020   K 4.3 02/21/2020   CO2 25 02/21/2020   GLUCOSE 91 02/21/2020   BUN 12 02/21/2020   CREATININE 0.99 02/21/2020   BILITOT 0.5 02/21/2020   ALKPHOS 91 02/21/2020   AST 22 02/21/2020   ALT 21 02/21/2020   PROT 7.0 02/21/2020   ALBUMIN 4.6 02/21/2020   CALCIUM 9.6 02/21/2020   Lab Results  Component Value Date   CHOL 167 02/21/2020   Lab Results  Component Value Date   HDL 56 02/21/2020   Lab Results  Component Value Date   LDLCALC 99 02/21/2020   Lab Results  Component Value Date   TRIG 63 02/21/2020   Lab Results  Component Value Date   CHOLHDL 3.0 02/21/2020   No results found for: HGBA1C     Assessment & Plan:  1. Acute non-recurrent sinusitis of other sinus 2. Acute bronchitis due to other specified organisms   Prescription for amoxicillin given.  I sent Hydromet to [facility] however I was told that it was on back order.  I resent cough medicine with guaifenesin and codeine to [facility] on  Meds ordered this encounter  Medications  . amoxicillin (AMOXIL) 875 MG tablet    Sig: Take 1 tablet (875 mg total) by mouth 2 (two) times daily.    Dispense:  20 tablet    Refill:  0  . DISCONTD: HYDROcodone-homatropine (HYCODAN) 5-1.5 MG/5ML syrup    Sig: Take 5 mLs by mouth every 8 (eight) hours as needed for cough.    Dispense:  120 mL    Refill:  0  . guaiFENesin-codeine 100-10 MG/5ML syrup    Sig: Take 5  mLs by mouth 3 (three) times daily as needed for cough.    Dispense:  120 mL    Refill:  0   Time:   Today, I have spent 15 minutes with the patient with telehealth technology discussing the above problems.   Follow Up:  In Person prn  An After Visit Summary was printed and given to the patient.  Blane Ohara, MD [facility] [phone]

## 2020-04-20 ENCOUNTER — Encounter: Payer: Self-pay | Admitting: Family Medicine

## 2020-04-20 ENCOUNTER — Telehealth: Payer: 59 | Admitting: Family Medicine

## 2020-04-20 DIAGNOSIS — Z1231 Encounter for screening mammogram for malignant neoplasm of breast: Secondary | ICD-10-CM | POA: Diagnosis not present

## 2020-04-29 ENCOUNTER — Other Ambulatory Visit: Payer: Self-pay | Admitting: Physician Assistant

## 2020-04-29 MED ORDER — LOSARTAN POTASSIUM 25 MG PO TABS: ORAL_TABLET | ORAL | 0 refills | Status: DC

## 2020-05-23 NOTE — Progress Notes (Signed)
Subjective:  Patient ID: Paige Hardy, female    DOB: 1966-02-26  Age: 55 y.o. MRN: [id number]  Chief Complaint  Patient presents with  . Hypertension  . Hyperlipidemia    HPI Hypertension: currently on losartan 25 mg once daily. Bp is usually in 140s.  Hyperlipidemia: Out of lipitor. She ran out 3 months ago. Not eating healthy or exercising.  Complaining of sinus/nasal congestion and cough x 2 weeks. Denies fever/chills.   Current Outpatient Medications on File Prior to Visit  Medication Sig Dispense Refill  . Ascorbic Acid (VITAMIN C) 1000 MG tablet Take 1,000 mg by mouth daily.    . Multiple Vitamin (MULTIVITAMIN) tablet Take 1 tablet by mouth daily.     No current facility-administered medications on file prior to visit.   Past Medical History:  Diagnosis Date  . Essential hypertension   . Mixed hyperlipidemia   . Vitamin D deficiency    Past Surgical History:  Procedure Laterality Date  . CHOLECYSTECTOMY      Family History  Problem Relation Age of Onset  . Hypertension Other   . Prostate cancer Father   . Hypertension Maternal Grandfather   . Diabetes Maternal Grandfather    Social History   Socioeconomic History  . Marital status: Married    Spouse name: Not on file  . Number of children: Not on file  . Years of education: Not on file  . Highest education level: Not on file  Occupational History  . Not on file  Tobacco Use  . Smoking status: Never Smoker  . Smokeless tobacco: Never Used  Substance and Sexual Activity  . Alcohol use: Not Currently  . Drug use: Never  . Sexual activity: Not on file  Other Topics Concern  . Not on file  Social History Narrative  . Not on file   Social Determinants of Health   Financial Resource Strain: Not on file  Food Insecurity: Not on file  Transportation Needs: Not on file  Physical Activity: Not on file  Stress: Not on file  Social Connections: Not on file    Review of Systems  Constitutional:  Negative for chills, fatigue and fever.  HENT: Positive for congestion. Negative for rhinorrhea and sore throat.   Respiratory: Positive for cough. Negative for shortness of breath.   Cardiovascular: Negative for chest pain and palpitations.  Gastrointestinal: Negative for abdominal pain, constipation, diarrhea, nausea and vomiting.  Genitourinary: Negative for dysuria and urgency.  Musculoskeletal: Negative for back pain and myalgias.  Neurological: Negative for dizziness, weakness, light-headedness and headaches.  Psychiatric/Behavioral: Negative for dysphoric mood. The patient is not nervous/anxious.      Objective:  BP (!) 144/88   Pulse 88   Temp (!) 97.3 F (36.3 C)   Resp 18   Ht 5\' 5"  (1.651 m)   Wt 187 lb (84.8 kg)   BMI 31.12 kg/m   BP/Weight 05/25/2020 04/02/2020 02/21/2020  Systolic BP 144 156 116  Diastolic BP 88 91 80  Wt. (Lbs) 187 186.9 189.6  BMI 31.12 31.1 31.55    Physical Exam Vitals reviewed.  Constitutional:      Appearance: Normal appearance. She is normal weight.  HENT:     Right Ear: Tympanic membrane, ear canal and external ear normal.     Left Ear: Tympanic membrane, ear canal and external ear normal.     Nose: Congestion present.     Mouth/Throat:     Pharynx: Oropharynx is clear. Posterior oropharyngeal erythema present.  No oropharyngeal exudate.  Cardiovascular:     Rate and Rhythm: Normal rate and regular rhythm.     Heart sounds: Normal heart sounds. No murmur heard.   Pulmonary:     Effort: Pulmonary effort is normal. No respiratory distress.     Breath sounds: Normal breath sounds.  Neurological:     Mental Status: She is alert and oriented to person, place, and time.  Psychiatric:        Mood and Affect: Mood normal.        Behavior: Behavior normal.     Diabetic Foot Exam - Simple   No data filed      Lab Results  Component Value Date   WBC 7.6 02/21/2020   HGB 12.8 02/21/2020   HCT 39.7 02/21/2020   PLT 220  02/21/2020   GLUCOSE 91 02/21/2020   CHOL 167 02/21/2020   TRIG 63 02/21/2020   HDL 56 02/21/2020   LDLCALC 99 02/21/2020   ALT 21 02/21/2020   AST 22 02/21/2020   NA 143 02/21/2020   K 4.3 02/21/2020   CL 103 02/21/2020   CREATININE 0.99 02/21/2020   BUN 12 02/21/2020   CO2 25 02/21/2020   TSH 2.14 08/14/2019      Assessment & Plan:   1. Essential hypertension Increase losartan to 50 mg once daily in am.  - Comprehensive metabolic panel - CBC with Differential/Platelet  2. Mixed hyperlipidemia Low fat diet and exercise.  Restart lipitor 20 mg once daily.  - Lipid panel - TSH  3. Class 1 obesity due to excess calories with serious comorbidity and body mass index (BMI) of 31-32 in adult Dash diet. Exercise.  4. Sore throat - acute nasopharyngitis. - POC COVID-19 BinaxNow negative - POCT rapid strep A negative    Meds ordered this encounter  Medications  . losartan (COZAAR) 50 MG tablet    Sig: TAKE ONE (1) TABLET ONCE DAILY    Dispense:  90 tablet    Refill:  0  . atorvastatin (LIPITOR) 20 MG tablet    Sig: Take 1 tablet (20 mg total) by mouth daily.    Dispense:  90 tablet    Refill:  0    Requesting 1 year supply    Orders Placed This Encounter  Procedures  . Comprehensive metabolic panel  . Lipid panel  . CBC with Differential/Platelet  . TSH  . POC COVID-19 BinaxNow  . POCT rapid strep A     Follow-up: No follow-ups on file.  An After Visit Summary was printed and given to the patient.  Blane Ohara, MD [facility] [phone]

## 2020-05-25 ENCOUNTER — Other Ambulatory Visit: Payer: Self-pay

## 2020-05-25 ENCOUNTER — Other Ambulatory Visit (HOSPITAL_COMMUNITY): Payer: Self-pay | Admitting: Family Medicine

## 2020-05-25 ENCOUNTER — Ambulatory Visit (INDEPENDENT_AMBULATORY_CARE_PROVIDER_SITE_OTHER): Payer: 59 | Admitting: Family Medicine

## 2020-05-25 VITALS — BP 144/88 | HR 88 | Temp 97.3°F | Resp 18 | Ht 65.0 in | Wt 187.0 lb

## 2020-05-25 DIAGNOSIS — E782 Mixed hyperlipidemia: Secondary | ICD-10-CM | POA: Diagnosis not present

## 2020-05-25 DIAGNOSIS — E559 Vitamin D deficiency, unspecified: Secondary | ICD-10-CM

## 2020-05-25 DIAGNOSIS — Z6831 Body mass index (BMI) 31.0-31.9, adult: Secondary | ICD-10-CM

## 2020-05-25 DIAGNOSIS — E6609 Other obesity due to excess calories: Secondary | ICD-10-CM

## 2020-05-25 DIAGNOSIS — I10 Essential (primary) hypertension: Secondary | ICD-10-CM

## 2020-05-25 DIAGNOSIS — J00 Acute nasopharyngitis [common cold]: Secondary | ICD-10-CM | POA: Diagnosis not present

## 2020-05-25 LAB — POC COVID19 BINAXNOW: SARS Coronavirus 2 Ag: NEGATIVE

## 2020-05-25 LAB — POCT RAPID STREP A (OFFICE): Rapid Strep A Screen: NEGATIVE

## 2020-05-25 MED ORDER — ATORVASTATIN CALCIUM 20 MG PO TABS: 20.0000 mg | ORAL_TABLET | Freq: Every day | ORAL | 0 refills | Status: DC

## 2020-05-25 MED ORDER — LOSARTAN POTASSIUM 50 MG PO TABS: ORAL_TABLET | ORAL | 0 refills | Status: DC

## 2020-05-25 NOTE — Patient Instructions (Signed)
Increase losartan to 50 mg once daily  https://www.mata.com/.pdf">  DASH Eating Plan DASH stands for Dietary Approaches to Stop Hypertension. The DASH eating plan is a healthy eating plan that has been shown to:  Reduce high blood pressure (hypertension).  Reduce your risk for type 2 diabetes, heart disease, and stroke.  Help with weight loss. What are tips for following this plan? Reading food labels  Check food labels for the amount of salt (sodium) per serving. Choose foods with less than 5 percent of the Daily Value of sodium. Generally, foods with less than 300 milligrams (mg) of sodium per serving fit into this eating plan.  To find whole grains, look for the word "whole" as the first word in the ingredient list. Shopping  Buy products labeled as "low-sodium" or "no salt added."  Buy fresh foods. Avoid canned foods and pre-made or frozen meals. Cooking  Avoid adding salt when cooking. Use salt-free seasonings or herbs instead of table salt or sea salt. Check with your health care provider or pharmacist before using salt substitutes.  Do not fry foods. Cook foods using healthy methods such as baking, boiling, grilling, roasting, and broiling instead.  Cook with heart-healthy oils, such as olive, canola, avocado, soybean, or sunflower oil. Meal planning  Eat a balanced diet that includes: ? 4 or more servings of fruits and 4 or more servings of vegetables each day. Try to fill one-half of your plate with fruits and vegetables. ? 6-8 servings of whole grains each day. ? Less than 6 oz (170 g) of lean meat, poultry, or fish each day. A 3-oz (85-g) serving of meat is about the same size as a deck of cards. One egg equals 1 oz (28 g). ? 2-3 servings of low-fat dairy each day. One serving is 1 cup (237 mL). ? 1 serving of nuts, seeds, or beans 5 times each week. ? 2-3 servings of heart-healthy fats. Healthy fats called omega-3 fatty acids are  found in foods such as walnuts, flaxseeds, fortified milks, and eggs. These fats are also found in cold-water fish, such as sardines, salmon, and mackerel.  Limit how much you eat of: ? Canned or prepackaged foods. ? Food that is high in trans fat, such as some fried foods. ? Food that is high in saturated fat, such as fatty meat. ? Desserts and other sweets, sugary drinks, and other foods with added sugar. ? Full-fat dairy products.  Do not salt foods before eating.  Do not eat more than 4 egg yolks a week.  Try to eat at least 2 vegetarian meals a week.  Eat more home-cooked food and less restaurant, buffet, and fast food.   Lifestyle  When eating at a restaurant, ask that your food be prepared with less salt or no salt, if possible.  If you drink alcohol: ? Limit how much you use to:  0-1 drink a day for women who are not pregnant.  0-2 drinks a day for men. ? Be aware of how much alcohol is in your drink. In the [place] one drink equals one 12 oz bottle of beer (355 mL), one 5 oz glass of wine (148 mL), or one 1 oz glass of hard liquor (44 mL). General information  Avoid eating more than 2,300 mg of salt a day. If you have hypertension, you may need to reduce your sodium intake to 1,500 mg a day.  Work with your health care provider to maintain a healthy body weight or to lose  weight. Ask what an ideal weight is for you.  Get at least 30 minutes of exercise that causes your heart to beat faster (aerobic exercise) most days of the week. Activities may include walking, swimming, or biking.  Work with your health care provider or dietitian to adjust your eating plan to your individual calorie needs. What foods should I eat? Fruits All fresh, dried, or frozen fruit. Canned fruit in natural juice (without added sugar). Vegetables Fresh or frozen vegetables (raw, steamed, roasted, or grilled). Low-sodium or reduced-sodium tomato and vegetable juice. Low-sodium or reduced-sodium  tomato sauce and tomato paste. Low-sodium or reduced-sodium canned vegetables. Grains Whole-grain or whole-wheat bread. Whole-grain or whole-wheat pasta. Brown rice. Orpah Cobb. Bulgur. Whole-grain and low-sodium cereals. Pita bread. Low-fat, low-sodium crackers. Whole-wheat flour tortillas. Meats and other proteins Skinless chicken or [place]. Ground chicken or [place]. Pork with fat trimmed off. Fish and seafood. Egg whites. Dried beans, peas, or lentils. Unsalted nuts, nut butters, and seeds. Unsalted canned beans. Lean cuts of beef with fat trimmed off. Low-sodium, lean precooked or cured meat, such as sausages or meat loaves. Dairy Low-fat (1%) or fat-free (skim) milk. Reduced-fat, low-fat, or fat-free cheeses. Nonfat, low-sodium ricotta or cottage cheese. Low-fat or nonfat yogurt. Low-fat, low-sodium cheese. Fats and oils Soft margarine without trans fats. Vegetable oil. Reduced-fat, low-fat, or light mayonnaise and salad dressings (reduced-sodium). Canola, safflower, olive, avocado, soybean, and sunflower oils. Avocado. Seasonings and condiments Herbs. Spices. Seasoning mixes without salt. Other foods Unsalted popcorn and pretzels. Fat-free sweets. The items listed above may not be a complete list of foods and beverages you can eat. Contact a dietitian for more information. What foods should I avoid? Fruits Canned fruit in a light or heavy syrup. Fried fruit. Fruit in cream or butter sauce. Vegetables Creamed or fried vegetables. Vegetables in a cheese sauce. Regular canned vegetables (not low-sodium or reduced-sodium). Regular canned tomato sauce and paste (not low-sodium or reduced-sodium). Regular tomato and vegetable juice (not low-sodium or reduced-sodium). Rosita Fire. Olives. Grains Baked goods made with fat, such as croissants, muffins, or some breads. Dry pasta or rice meal packs. Meats and other proteins Fatty cuts of meat. Ribs. Fried meat. Tomasa Blase. Bologna, salami, and other  precooked or cured meats, such as sausages or meat loaves. Fat from the back of a pig (fatback). Bratwurst. Salted nuts and seeds. Canned beans with added salt. Canned or smoked fish. Whole eggs or egg yolks. Chicken or [place] with skin. Dairy Whole or 2% milk, cream, and half-and-half. Whole or full-fat cream cheese. Whole-fat or sweetened yogurt. Full-fat cheese. Nondairy creamers. Whipped toppings. Processed cheese and cheese spreads. Fats and oils Butter. Stick margarine. Lard. Shortening. Ghee. Bacon fat. Tropical oils, such as coconut, palm kernel, or palm oil. Seasonings and condiments Onion salt, garlic salt, seasoned salt, table salt, and sea salt. Worcestershire sauce. Tartar sauce. Barbecue sauce. Teriyaki sauce. Soy sauce, including reduced-sodium. Steak sauce. Canned and packaged gravies. Fish sauce. Oyster sauce. Cocktail sauce. Store-bought horseradish. Ketchup. Mustard. Meat flavorings and tenderizers. Bouillon cubes. Hot sauces. Pre-made or packaged marinades. Pre-made or packaged taco seasonings. Relishes. Regular salad dressings. Other foods Salted popcorn and pretzels. The items listed above may not be a complete list of foods and beverages you should avoid. Contact a dietitian for more information. Where to find more information  National Heart, Lung, and Blood Institute: PopSteam.is  American Heart Association: www.heart.org  Academy of Nutrition and Dietetics: www.eatright.org  National Kidney Foundation: www.kidney.org Summary  The DASH eating plan is a  healthy eating plan that has been shown to reduce high blood pressure (hypertension). It may also reduce your risk for type 2 diabetes, heart disease, and stroke.  When on the DASH eating plan, aim to eat more fresh fruits and vegetables, whole grains, lean proteins, low-fat dairy, and heart-healthy fats.  With the DASH eating plan, you should limit salt (sodium) intake to 2,300 mg a day. If you have  hypertension, you may need to reduce your sodium intake to 1,500 mg a day.  Work with your health care provider or dietitian to adjust your eating plan to your individual calorie needs. This information is not intended to replace advice given to you by your health care provider. Make sure you discuss any questions you have with your health care provider. Document Revised: 03/15/2019 Document Reviewed: 03/15/2019 Elsevier Patient Education  2021 [employer].

## 2020-05-26 ENCOUNTER — Encounter: Payer: Self-pay | Admitting: Family Medicine

## 2020-05-26 LAB — COMPREHENSIVE METABOLIC PANEL
ALT: 19 IU/L (ref 0–32)
AST: 21 IU/L (ref 0–40)
Albumin/Globulin Ratio: 1.6 (ref 1.2–2.2)
Albumin: 4.5 g/dL (ref 3.8–4.9)
Alkaline Phosphatase: 96 IU/L (ref 44–121)
BUN/Creatinine Ratio: 9 (ref 9–23)
BUN: 9 mg/dL (ref 6–24)
Bilirubin Total: 0.4 mg/dL (ref 0.0–1.2)
CO2: 27 mmol/L (ref 20–29)
Calcium: 9.5 mg/dL (ref 8.7–10.2)
Chloride: 101 mmol/L (ref 96–106)
Creatinine, Ser: 1.05 mg/dL — ABNORMAL HIGH (ref 0.57–1.00)
GFR calc Af Amer: 71 mL/min/{1.73_m2} (ref >59)
GFR calc non Af Amer: 61 mL/min/{1.73_m2} (ref >59)
Globulin, Total: 2.9 g/dL (ref 1.5–4.5)
Glucose: 105 mg/dL — ABNORMAL HIGH (ref 65–99)
Potassium: 4.3 mmol/L (ref 3.5–5.2)
Sodium: 142 mmol/L (ref 134–144)
Total Protein: 7.4 g/dL (ref 6.0–8.5)

## 2020-05-26 LAB — CBC WITH DIFFERENTIAL/PLATELET
Basophils Absolute: 0.1 10*3/uL (ref 0.0–0.2)
Basos: 1 %
EOS (ABSOLUTE): 0.2 10*3/uL (ref 0.0–0.4)
Eos: 2 %
Hematocrit: 42.4 % (ref 34.0–46.6)
Hemoglobin: 13.5 g/dL (ref 11.1–15.9)
Immature Grans (Abs): 0.1 10*3/uL (ref 0.0–0.1)
Immature Granulocytes: 1 %
Lymphocytes Absolute: 2.5 10*3/uL (ref 0.7–3.1)
Lymphs: 32 %
MCH: 27.6 pg (ref 26.6–33.0)
MCHC: 31.8 g/dL (ref 31.5–35.7)
MCV: 87 fL (ref 79–97)
Monocytes Absolute: 0.4 10*3/uL (ref 0.1–0.9)
Monocytes: 5 %
Neutrophils Absolute: 4.7 10*3/uL (ref 1.4–7.0)
Neutrophils: 59 %
Platelets: 224 10*3/uL (ref 150–450)
RBC: 4.9 x10E6/uL (ref 3.77–5.28)
RDW: 13.2 % (ref 11.7–15.4)
WBC: 7.8 10*3/uL (ref 3.4–10.8)

## 2020-05-26 LAB — LIPID PANEL
Chol/HDL Ratio: 4.8 ratio — ABNORMAL HIGH (ref 0.0–4.4)
Cholesterol, Total: 219 mg/dL — ABNORMAL HIGH (ref 100–199)
HDL: 46 mg/dL (ref >39)
LDL Chol Calc (NIH): 152 mg/dL — ABNORMAL HIGH (ref 0–99)
Triglycerides: 115 mg/dL (ref 0–149)
VLDL Cholesterol Cal: 21 mg/dL (ref 5–40)

## 2020-05-26 LAB — TSH: TSH: 2.79 u[IU]/mL (ref 0.450–4.500)

## 2020-05-26 LAB — CARDIOVASCULAR RISK ASSESSMENT

## 2020-05-29 LAB — HGB A1C W/O EAG: Hgb A1c MFr Bld: 5.4 % (ref 4.8–5.6)

## 2020-05-29 LAB — SPECIMEN STATUS REPORT

## 2020-07-23 MED FILL — ATORVASTATIN CALCIUM 20 MG: 20 | 30 days supply | Qty: 30 | Fill #0

## 2020-07-23 MED FILL — LOSARTAN POTASSIUM 50 MG TA: 50 | 30 days supply | Qty: 30 | Fill #0

## 2020-08-24 ENCOUNTER — Ambulatory Visit: Payer: 59 | Admitting: Family Medicine

## 2020-08-24 ENCOUNTER — Other Ambulatory Visit: Payer: Self-pay

## 2020-08-24 VITALS — BP 118/82 | HR 88 | Temp 96.6°F | Resp 16 | Ht 65.0 in | Wt 189.0 lb

## 2020-08-24 DIAGNOSIS — Z6831 Body mass index (BMI) 31.0-31.9, adult: Secondary | ICD-10-CM

## 2020-08-24 DIAGNOSIS — I10 Essential (primary) hypertension: Secondary | ICD-10-CM

## 2020-08-24 DIAGNOSIS — E782 Mixed hyperlipidemia: Secondary | ICD-10-CM

## 2020-08-24 NOTE — Progress Notes (Signed)
Subjective:  Patient ID: Paige Hardy, female    DOB: 15-Dec-1965  Age: 55 y.o. MRN: [id number]  Chief Complaint  Patient presents with  . Hypertension   HPI   Current Outpatient Medications on File Prior to Visit  Medication Sig Dispense Refill  . Ascorbic Acid (VITAMIN C) 1000 MG tablet Take 1,000 mg by mouth daily.    Marland Kitchen atorvastatin (LIPITOR) 20 MG tablet Take 1 tablet (20 mg total) by mouth daily. 90 tablet 0  . losartan (COZAAR) 50 MG tablet TAKE 1 TABLET BY MOUTH ONCE DAILY 30 tablet 0  . Multiple Vitamin (MULTIVITAMIN) tablet Take 1 tablet by mouth daily.     No current facility-administered medications on file prior to visit.   Past Medical History:  Diagnosis Date  . Essential hypertension   . Mixed hyperlipidemia   . Vitamin D deficiency    Past Surgical History:  Procedure Laterality Date  . CHOLECYSTECTOMY      Family History  Problem Relation Age of Onset  . Hypertension Other   . Prostate cancer Father   . Hypertension Maternal Grandfather   . Diabetes Maternal Grandfather    Social History   Socioeconomic History  . Marital status: Married    Spouse name: Not on file  . Number of children: Not on file  . Years of education: Not on file  . Highest education level: Not on file  Occupational History  . Not on file  Tobacco Use  . Smoking status: Never Smoker  . Smokeless tobacco: Never Used  Substance and Sexual Activity  . Alcohol use: Not Currently  . Drug use: Never  . Sexual activity: Not on file  Other Topics Concern  . Not on file  Social History Narrative  . Not on file   Social Determinants of Health   Financial Resource Strain: Not on file  Food Insecurity: Not on file  Transportation Needs: Not on file  Physical Activity: Not on file  Stress: Not on file  Social Connections: Not on file    Review of Systems  Constitutional: Negative for chills, fatigue and fever.  HENT: Negative for congestion, ear pain and sore throat.    Respiratory: Negative for cough and shortness of breath.   Cardiovascular: Negative for chest pain.  Gastrointestinal: Negative for abdominal pain, constipation, diarrhea, nausea and vomiting.  Endocrine: Negative for polydipsia and polyphagia.  Genitourinary: Negative for dysuria and urgency.  Musculoskeletal: Negative for arthralgias and myalgias.  Skin: Negative for rash.  Neurological: Negative for dizziness and headaches.  Psychiatric/Behavioral: Negative for dysphoric mood. The patient is not nervous/anxious.      Objective:  BP 118/82   Pulse 88   Temp (!) 96.6 F (35.9 C)   Resp 16   Ht 5\' 5"  (1.651 m)   Wt 189 lb (85.7 kg)   BMI 31.45 kg/m   BP/Weight 08/24/2020 05/25/2020 04/02/2020  Systolic BP 118 144 156  Diastolic BP 82 88 91  Wt. (Lbs) 189 187 186.9  BMI 31.45 31.12 31.1    Physical Exam Vitals reviewed.  Constitutional:      Appearance: Normal appearance. She is normal weight.  Neck:     Vascular: No carotid bruit.  Cardiovascular:     Rate and Rhythm: Normal rate and regular rhythm.     Heart sounds: Normal heart sounds.  Pulmonary:     Effort: Pulmonary effort is normal. No respiratory distress.     Breath sounds: Normal breath sounds.  Abdominal:  General: Abdomen is flat. Bowel sounds are normal.     Palpations: Abdomen is soft.     Tenderness: There is no abdominal tenderness.  Neurological:     Mental Status: She is alert and oriented to person, place, and time.  Psychiatric:        Mood and Affect: Mood normal.        Behavior: Behavior normal.     Diabetic Foot Exam - Simple   No data filed      Lab Results  Component Value Date   WBC 8.4 08/24/2020   HGB 12.6 08/24/2020   HCT 39.4 08/24/2020   PLT 272 08/24/2020   GLUCOSE 98 08/24/2020   CHOL 158 08/24/2020   TRIG 60 08/24/2020   HDL 51 08/24/2020   LDLCALC 95 08/24/2020   ALT 17 08/24/2020   AST 14 08/24/2020   NA 144 08/24/2020   K 4.5 08/24/2020   CL 105 08/24/2020    CREATININE 1.05 (H) 08/24/2020   BUN 13 08/24/2020   CO2 25 08/24/2020   TSH 2.790 05/25/2020   HGBA1C 5.4 05/25/2020    Assessment & Plan:   1. Essential hypertension Well controlled.  No changes to medicines.  Continue to work on eating a healthy diet and exercise.  Labs drawn today.  - CBC with Differential/Platelet - Comprehensive metabolic panel  2. Mixed hyperlipidemia Well controlled.  No changes to medicines.  Continue to work on eating a healthy diet and exercise.  Labs drawn today.  - Lipid panel  3. BMI 31.0-31.9,adult  Recommend continue to work on eating healthy diet and exercise.   Orders Placed This Encounter  Procedures  . CBC with Differential/Platelet  . Comprehensive metabolic panel  . Lipid panel  . Cardiovascular Risk Assessment     Follow-up: Return in about 3 months (around 11/24/2020) for fasting.Marland Kitchen  An After Visit Summary was printed and given to the patient.  Blane Ohara, MD [facility] [phone]

## 2020-08-25 LAB — COMPREHENSIVE METABOLIC PANEL
ALT: 17 IU/L (ref 0–32)
AST: 14 IU/L (ref 0–40)
Albumin/Globulin Ratio: 1.4 (ref 1.2–2.2)
Albumin: 4.3 g/dL (ref 3.8–4.9)
Alkaline Phosphatase: 94 IU/L (ref 44–121)
BUN/Creatinine Ratio: 12 (ref 9–23)
BUN: 13 mg/dL (ref 6–24)
Bilirubin Total: 0.4 mg/dL (ref 0.0–1.2)
CO2: 25 mmol/L (ref 20–29)
Calcium: 9.3 mg/dL (ref 8.7–10.2)
Chloride: 105 mmol/L (ref 96–106)
Creatinine, Ser: 1.05 mg/dL — ABNORMAL HIGH (ref 0.57–1.00)
Globulin, Total: 3 g/dL (ref 1.5–4.5)
Glucose: 98 mg/dL (ref 65–99)
Potassium: 4.5 mmol/L (ref 3.5–5.2)
Sodium: 144 mmol/L (ref 134–144)
Total Protein: 7.3 g/dL (ref 6.0–8.5)
eGFR: 64 mL/min/{1.73_m2} (ref >59)

## 2020-08-25 LAB — CBC WITH DIFFERENTIAL/PLATELET
Basophils Absolute: 0.1 10*3/uL (ref 0.0–0.2)
Basos: 1 %
EOS (ABSOLUTE): 0.1 10*3/uL (ref 0.0–0.4)
Eos: 1 %
Hematocrit: 39.4 % (ref 34.0–46.6)
Hemoglobin: 12.6 g/dL (ref 11.1–15.9)
Immature Grans (Abs): 0 10*3/uL (ref 0.0–0.1)
Immature Granulocytes: 0 %
Lymphocytes Absolute: 2 10*3/uL (ref 0.7–3.1)
Lymphs: 24 %
MCH: 27.4 pg (ref 26.6–33.0)
MCHC: 32 g/dL (ref 31.5–35.7)
MCV: 86 fL (ref 79–97)
Monocytes Absolute: 0.5 10*3/uL (ref 0.1–0.9)
Monocytes: 6 %
Neutrophils Absolute: 5.7 10*3/uL (ref 1.4–7.0)
Neutrophils: 68 %
Platelets: 272 10*3/uL (ref 150–450)
RBC: 4.6 x10E6/uL (ref 3.77–5.28)
RDW: 13.1 % (ref 11.7–15.4)
WBC: 8.4 10*3/uL (ref 3.4–10.8)

## 2020-08-25 LAB — LIPID PANEL
Chol/HDL Ratio: 3.1 ratio (ref 0.0–4.4)
Cholesterol, Total: 158 mg/dL (ref 100–199)
HDL: 51 mg/dL (ref >39)
LDL Chol Calc (NIH): 95 mg/dL (ref 0–99)
Triglycerides: 60 mg/dL (ref 0–149)
VLDL Cholesterol Cal: 12 mg/dL (ref 5–40)

## 2020-08-25 LAB — CARDIOVASCULAR RISK ASSESSMENT

## 2020-08-30 ENCOUNTER — Encounter: Payer: Self-pay | Admitting: Family Medicine

## 2020-08-31 ENCOUNTER — Other Ambulatory Visit: Payer: Self-pay | Admitting: Family Medicine

## 2020-08-31 ENCOUNTER — Other Ambulatory Visit (HOSPITAL_COMMUNITY): Payer: Self-pay

## 2020-08-31 MED ORDER — ATORVASTATIN CALCIUM 20 MG PO TABS
1.0000 | ORAL_TABLET | Freq: Every day | ORAL | 0 refills | Status: DC
  Filled 2020-08-31: qty 90, 90d supply, fill #0

## 2020-08-31 MED ORDER — LOSARTAN POTASSIUM 50 MG PO TABS
1.0000 | ORAL_TABLET | Freq: Every day | ORAL | 0 refills | Status: DC
  Filled 2020-08-31: qty 90, 90d supply, fill #0

## 2020-09-01 ENCOUNTER — Other Ambulatory Visit (HOSPITAL_COMMUNITY): Payer: Self-pay

## 2020-09-04 ENCOUNTER — Other Ambulatory Visit (HOSPITAL_BASED_OUTPATIENT_CLINIC_OR_DEPARTMENT_OTHER): Payer: Self-pay

## 2020-11-30 ENCOUNTER — Other Ambulatory Visit: Payer: Self-pay | Admitting: Family Medicine

## 2020-11-30 ENCOUNTER — Other Ambulatory Visit (HOSPITAL_COMMUNITY): Payer: Self-pay

## 2020-11-30 MED ORDER — LOSARTAN POTASSIUM 50 MG PO TABS
50.0000 mg | ORAL_TABLET | Freq: Every day | ORAL | 0 refills | Status: DC
  Filled 2020-11-30: qty 90, 90d supply, fill #0

## 2020-11-30 MED ORDER — ATORVASTATIN CALCIUM 20 MG PO TABS
20.0000 mg | ORAL_TABLET | Freq: Every day | ORAL | 0 refills | Status: DC
  Filled 2020-11-30: qty 90, 90d supply, fill #0

## 2020-12-07 ENCOUNTER — Other Ambulatory Visit: Payer: Self-pay

## 2020-12-07 ENCOUNTER — Encounter: Payer: Self-pay | Admitting: Family Medicine

## 2020-12-07 ENCOUNTER — Ambulatory Visit (INDEPENDENT_AMBULATORY_CARE_PROVIDER_SITE_OTHER): Payer: 59 | Admitting: Family Medicine

## 2020-12-07 ENCOUNTER — Ambulatory Visit: Payer: 59 | Admitting: Family Medicine

## 2020-12-07 VITALS — BP 136/90 | HR 78 | Temp 97.4°F | Resp 16 | Ht 65.0 in | Wt 192.0 lb

## 2020-12-07 DIAGNOSIS — Z1231 Encounter for screening mammogram for malignant neoplasm of breast: Secondary | ICD-10-CM

## 2020-12-07 DIAGNOSIS — Z6831 Body mass index (BMI) 31.0-31.9, adult: Secondary | ICD-10-CM | POA: Diagnosis not present

## 2020-12-07 DIAGNOSIS — E782 Mixed hyperlipidemia: Secondary | ICD-10-CM

## 2020-12-07 DIAGNOSIS — Z0000 Encounter for general adult medical examination without abnormal findings: Secondary | ICD-10-CM

## 2020-12-07 DIAGNOSIS — Z124 Encounter for screening for malignant neoplasm of cervix: Secondary | ICD-10-CM

## 2020-12-07 NOTE — Patient Instructions (Signed)
Preventive Care 69-53 Years Old, Female Preventive care refers to lifestyle choices and visits with your health care provider that can promote health and wellness. This includes: A yearly physical exam. This is also called an annual wellness visit. Regular dental and eye exams. Immunizations. Screening for certain conditions. Healthy lifestyle choices, such as: Eating a healthy diet. Getting regular exercise. Not using drugs or products that contain nicotine and tobacco. Limiting alcohol use. What can I expect for my preventive care visit? Physical exam Your health care provider will check your: Height and weight. These may be used to calculate your BMI (body mass index). BMI is a measurement that tells if you are at a healthy weight. Heart rate and blood pressure. Body temperature. Skin for abnormal spots. Counseling Your health care provider may ask you questions about your: Past medical problems. Family's medical history. Alcohol, tobacco, and drug use. Emotional well-being. Home life and relationship well-being. Sexual activity. Diet, exercise, and sleep habits. Work and work Statistician. Access to firearms. Method of birth control. Menstrual cycle. Pregnancy history. What immunizations do I need?  Vaccines are usually given at various ages, according to a schedule. Your health care provider will recommend vaccines for you based on your age, medicalhistory, and lifestyle or other factors, such as travel or where you work. What tests do I need? Blood tests Lipid and cholesterol levels. These may be checked every 5 years, or more often if you are over 56 years old. Hepatitis C test. Hepatitis B test. Screening Lung cancer screening. You may have this screening every year starting at age 14 if you have a 30-pack-year history of smoking and currently smoke or have quit within the past 15 years. Colorectal cancer screening. All adults should have this screening starting at  age 24 and continuing until age 71. Your health care provider may recommend screening at age 39 if you are at increased risk. You will have tests every 1-10 years, depending on your results and the type of screening test. Diabetes screening. This is done by checking your blood sugar (glucose) after you have not eaten for a while (fasting). You may have this done every 1-3 years. Mammogram. This may be done every 1-2 years. Talk with your health care provider about when you should start having regular mammograms. This may depend on whether you have a family history of breast cancer. BRCA-related cancer screening. This may be done if you have a family history of breast, ovarian, tubal, or peritoneal cancers. Pelvic exam and Pap test. This may be done every 3 years starting at age 30. Starting at age 73, this may be done every 5 years if you have a Pap test in combination with an HPV test. Other tests STD (sexually transmitted disease) testing, if you are at risk. Bone density scan. This is done to screen for osteoporosis. You may have this scan if you are at high risk for osteoporosis. Talk with your health care provider about your test results, treatment options,and if necessary, the need for more tests. Follow these instructions at home: Eating and drinking  Eat a diet that includes fresh fruits and vegetables, whole grains, lean protein, and low-fat dairy products. Take vitamin and mineral supplements as recommended by your health care provider. Do not drink alcohol if: Your health care provider tells you not to drink. You are pregnant, may be pregnant, or are planning to become pregnant. If you drink alcohol: Limit how much you have to 0-1 drink a day. Be aware  of how much alcohol is in your drink. In the [place] one drink equals one 12 oz bottle of beer (355 mL), one 5 oz glass of wine (148 mL), or one 1 oz glass of hard liquor (44 mL).  Lifestyle Take daily care of your teeth and  gums. Brush your teeth every morning and night with fluoride toothpaste. Floss one time each day. Stay active. Exercise for at least 30 minutes 5 or more days each week. Do not use any products that contain nicotine or tobacco, such as cigarettes, e-cigarettes, and chewing tobacco. If you need help quitting, ask your health care provider. Do not use drugs. If you are sexually active, practice safe sex. Use a condom or other form of protection to prevent STIs (sexually transmitted infections). If you do not wish to become pregnant, use a form of birth control. If you plan to become pregnant, see your health care provider for a prepregnancy visit. If told by your health care provider, take low-dose aspirin daily starting at age 58. Find healthy ways to cope with stress, such as: Meditation, yoga, or listening to music. Journaling. Talking to a trusted person. Spending time with friends and family. Safety Always wear your seat belt while driving or riding in a vehicle. Do not drive: If you have been drinking alcohol. Do not ride with someone who has been drinking. When you are tired or distracted. While texting. Wear a helmet and other protective equipment during sports activities. If you have firearms in your house, make sure you follow all gun safety procedures. What's next? Visit your health care provider once a year for an annual wellness visit. Ask your health care provider how often you should have your eyes and teeth checked. Stay up to date on all vaccines. This information is not intended to replace advice given to you by your health care provider. Make sure you discuss any questions you have with your healthcare provider. Document Revised: 01/14/2020 Document Reviewed: 12/21/2017 Elsevier Patient Education  2022 [employer].

## 2020-12-07 NOTE — Progress Notes (Signed)
Subjective:  Patient ID: Paige Hardy, female    DOB: 11-02-1965  Age: 55 y.o. MRN: [id number]  Chief Complaint  Patient presents with   Annual Exam     HPI Well Adult Physical: Patient here for a comprehensive physical exam.The patient reports no problems Do you take any herbs or supplements that were not prescribed by a doctor? no Are you taking calcium supplements? no Are you taking aspirin daily? no  Encounter for general adult medical examination without abnormal findings  Patient's last physical exam was 1 year ago .  Smoking: Non-smoker ;  Physical Activity: Exercises at least 3 times per week ;  Alcohol/Drug Use: Drinks socially ; No illicit drug use ;  Patient is not afflicted from Stress Incontinence and Urge Incontinence  Safety: reviewed. Patient wears a seat belt, has smoke detectors, practices appropriate gun safety, and wears sunscreen with extended sun exposure. Dental Care: biannual cleanings, brushes and flosses daily. Ophthalmology/Optometry: Is unsure of last appointment Hearing loss: none Vision impairments: none  LMP: menopausal Pregnancy history: G1P1 Safe at home: yes Last Mammogram: 04/20/2021  Flowsheet Row Office Visit from 12/07/2020 in [facility]  PHQ-2 Total Score 0         Social Hx   Social History   Socioeconomic History   Marital status: Married    Spouse name: Not on file   Number of children: Not on file   Years of education: Not on file   Highest education level: Not on file  Occupational History   Not on file  Tobacco Use   Smoking status: Never   Smokeless tobacco: Never  Substance and Sexual Activity   Alcohol use: Not Currently   Drug use: Never   Sexual activity: Not on file  Other Topics Concern   Not on file  Social History Narrative   Not on file   Social Determinants of Health   Financial Resource Strain: Not on file  Food Insecurity: Not on file  Transportation Needs: Not on file  Physical  Activity: Not on file  Stress: Not on file  Social Connections: Not on file   Past Medical History:  Diagnosis Date   Essential hypertension    Mixed hyperlipidemia    Vitamin D deficiency    Past Surgical History:  Procedure Laterality Date   CHOLECYSTECTOMY      Family History  Problem Relation Age of Onset   Hypertension Other    Prostate cancer Father    Hypertension Maternal Grandfather    Diabetes Maternal Grandfather     Review of Systems  Constitutional:  Negative for chills and fatigue.  HENT:  Negative for congestion and sore throat.   Eyes:  Negative for visual disturbance.  Respiratory:  Negative for cough and shortness of breath.   Cardiovascular:  Negative for chest pain, palpitations and leg swelling.  Gastrointestinal:  Negative for abdominal pain, constipation, diarrhea, nausea and vomiting.  Endocrine: Negative for cold intolerance, heat intolerance, polyphagia and polyuria.  Genitourinary:  Negative for difficulty urinating, frequency and urgency.  Neurological:  Negative for dizziness, weakness and numbness.  Psychiatric/Behavioral:  The patient is not nervous/anxious.     Objective:  BP 136/90   Pulse 78   Temp (!) 97.4 F (36.3 C)   Resp 16   Ht 5\' 5"  (1.651 m)   Wt 192 lb (87.1 kg)   BMI 31.95 kg/m   BP/Weight 12/07/2020 12/07/2020 08/24/2020  Systolic BP 136 136 118  Diastolic BP 90  90 82  Wt. (Lbs) 192 192 189  BMI 31.95 31.95 31.45    Physical Exam Vitals reviewed. Exam conducted with a chaperone present.  Constitutional:      Appearance: Normal appearance.  HENT:     Head: Normocephalic.     Right Ear: Tympanic membrane, ear canal and external ear normal.     Left Ear: Tympanic membrane, ear canal and external ear normal.     Nose: Nose normal.     Mouth/Throat:     Mouth: Mucous membranes are moist.     Pharynx: Oropharynx is clear.  Neck:     Vascular: No carotid bruit.  Cardiovascular:     Rate and Rhythm: Normal rate and  regular rhythm.     Pulses: Normal pulses.     Heart sounds: Normal heart sounds.  Pulmonary:     Effort: Pulmonary effort is normal.     Breath sounds: Normal breath sounds.  Abdominal:     General: Abdomen is flat. Bowel sounds are normal.     Palpations: Abdomen is soft.  Genitourinary:    General: Normal vulva.     Exam position: Lithotomy position.     Pubic Area: No rash.      Labia:        Right: No rash, tenderness or lesion.        Left: No rash, tenderness or lesion.      Vagina: No vaginal discharge or erythema.     Cervix: No discharge, lesion or erythema.     Uterus: Normal.   Skin:    General: Skin is warm and dry.  Neurological:     General: No focal deficit present.     Mental Status: She is alert and oriented to person, place, and time.  Psychiatric:        Mood and Affect: Mood normal.        Behavior: Behavior normal.    Lab Results  Component Value Date   WBC 8.4 08/24/2020   HGB 12.6 08/24/2020   HCT 39.4 08/24/2020   PLT 272 08/24/2020   GLUCOSE 98 08/24/2020   CHOL 158 08/24/2020   TRIG 60 08/24/2020   HDL 51 08/24/2020   LDLCALC 95 08/24/2020   ALT 17 08/24/2020   AST 14 08/24/2020   NA 144 08/24/2020   K 4.5 08/24/2020   CL 105 08/24/2020   CREATININE 1.05 (H) 08/24/2020   BUN 13 08/24/2020   CO2 25 08/24/2020   TSH 2.790 05/25/2020   HGBA1C 5.4 05/25/2020    Assessment/Plan:  1. Routine medical exam Pap/Mamm ordered. Recommend continue to work on eating healthy diet and exercise. Consider covid 19 booster and shingrix vaccines.  - CBC with Differential/Platelet - Comprehensive metabolic panel - Lipid panel  2. Encounter for screening mammogram for malignant neoplasm of breast - [facility] mammogram to be scheduled  3. Cervical cancer screening - PAP exam  4. BMI 31.0-31.9,adult - Encouraged regular exercise and eating healthy   This is a list of the screening recommended for you and due dates:  Health Maintenance  Topic  Date Due   HIV Screening  Never done   Hepatitis C Screening: USPSTF Recommendation to screen - Ages 53-79 yo.  Never done   Pap Smear  Never done   Zoster (Shingles) Vaccine (1 of 2) Never done   COVID-19 Vaccine (3 - Booster for Pfizer series) 05/28/2020   Flu Shot  11/23/2020   Mammogram  04/20/2022   Tetanus Vaccine  08/19/2028   Colon Cancer Screening  01/16/2030   Pneumococcal Vaccination  Aged Out   HPV Vaccine  Aged Out     AN INDIVIDUALIZED CARE PLAN: was established or reinforced today.   SELF MANAGEMENT: The patient and I together assessed ways to personally work towards obtaining the recommended goals  Support needs The patient and/or family needs were assessed and services were offered if appropriate.  Follow-up: Return in about 1 year (around 12/07/2021) for fasting CPE.  An After Visit Summary was printed and given to the patient.  Blane Ohara, MD [facility] [phone]

## 2020-12-07 NOTE — Addendum Note (Signed)
Addended byBlane Ohara on: 12/07/2020 04:37 PM   Modules accepted: Orders

## 2020-12-08 LAB — CBC WITH DIFFERENTIAL/PLATELET
Basophils Absolute: 0 10*3/uL (ref 0.0–0.2)
Basos: 0 %
EOS (ABSOLUTE): 0.2 10*3/uL (ref 0.0–0.4)
Eos: 2 %
Hematocrit: 41.3 % (ref 34.0–46.6)
Hemoglobin: 13 g/dL (ref 11.1–15.9)
Immature Grans (Abs): 0 10*3/uL (ref 0.0–0.1)
Immature Granulocytes: 0 %
Lymphocytes Absolute: 2.2 10*3/uL (ref 0.7–3.1)
Lymphs: 30 %
MCH: 27.3 pg (ref 26.6–33.0)
MCHC: 31.5 g/dL (ref 31.5–35.7)
MCV: 87 fL (ref 79–97)
Monocytes Absolute: 0.4 10*3/uL (ref 0.1–0.9)
Monocytes: 6 %
Neutrophils Absolute: 4.5 10*3/uL (ref 1.4–7.0)
Neutrophils: 62 %
Platelets: 239 10*3/uL (ref 150–450)
RBC: 4.76 x10E6/uL (ref 3.77–5.28)
RDW: 13.5 % (ref 11.7–15.4)
WBC: 7.4 10*3/uL (ref 3.4–10.8)

## 2020-12-08 LAB — COMPREHENSIVE METABOLIC PANEL
ALT: 20 IU/L (ref 0–32)
AST: 22 IU/L (ref 0–40)
Albumin/Globulin Ratio: 1.7 (ref 1.2–2.2)
Albumin: 4.6 g/dL (ref 3.8–4.9)
Alkaline Phosphatase: 95 IU/L (ref 44–121)
BUN/Creatinine Ratio: 10 (ref 9–23)
BUN: 11 mg/dL (ref 6–24)
Bilirubin Total: 0.4 mg/dL (ref 0.0–1.2)
CO2: 24 mmol/L (ref 20–29)
Calcium: 9.5 mg/dL (ref 8.7–10.2)
Chloride: 104 mmol/L (ref 96–106)
Creatinine, Ser: 1.06 mg/dL — ABNORMAL HIGH (ref 0.57–1.00)
Globulin, Total: 2.7 g/dL (ref 1.5–4.5)
Glucose: 103 mg/dL — ABNORMAL HIGH (ref 65–99)
Potassium: 4.2 mmol/L (ref 3.5–5.2)
Sodium: 142 mmol/L (ref 134–144)
Total Protein: 7.3 g/dL (ref 6.0–8.5)
eGFR: 63 mL/min/{1.73_m2} (ref >59)

## 2020-12-08 LAB — CARDIOVASCULAR RISK ASSESSMENT

## 2020-12-08 LAB — LIPID PANEL
Chol/HDL Ratio: 3.1 ratio (ref 0.0–4.4)
Cholesterol, Total: 159 mg/dL (ref 100–199)
HDL: 52 mg/dL (ref >39)
LDL Chol Calc (NIH): 93 mg/dL (ref 0–99)
Triglycerides: 75 mg/dL (ref 0–149)
VLDL Cholesterol Cal: 14 mg/dL (ref 5–40)

## 2020-12-10 LAB — IGP, APTIMA HPV, RFX 16/18,45
HPV Aptima: NEGATIVE
PAP Smear Comment: 0

## 2021-01-07 ENCOUNTER — Telehealth: Payer: Self-pay

## 2021-01-07 NOTE — Telephone Encounter (Signed)
Patient came in with her son today, would like to be a [facility] patient- she is in process of getting referral, told her we have to wait for referral and go from there

## 2021-01-08 NOTE — Telephone Encounter (Signed)
Copied from CRM [id number]. Topic: Appointments - Schedule Appointment  >> Jan 08, 2021 12:29 PM Barbera Setters wrote:  Ms. Brewton is calling to schedule an appointment.    Appointment Type: NPV  Provider: Dr Consepcion Hearing  What does patient need to be seen for?: Psoriasis  Preferred location?: [facility]  Type of insurance: [employer] blue choice option      Is there a referral on file?: No  Faxed today  Were the demographics and insurance verified?: Yes  Was the patient connected to [facility] if applicable?: No  Was the appropriate expectation set with the patient for a time frame to receive a return call from the office? Yes    Patient can be reached at: [phone] (home)       Patient indicated her son Baird Lyons has an appointment scheduled on 12/ 21 @11 :15 am with Dr and she is requesting a back to back appointment as well

## 2021-01-08 NOTE — Telephone Encounter (Signed)
Patient was called, LM     We are just waiting on referral.

## 2021-01-15 ENCOUNTER — Other Ambulatory Visit: Payer: Self-pay

## 2021-01-15 ENCOUNTER — Ambulatory Visit: Payer: 59 | Admitting: Legal Medicine

## 2021-01-15 ENCOUNTER — Encounter: Payer: Self-pay | Admitting: Legal Medicine

## 2021-01-15 VITALS — BP 124/88 | HR 99 | Temp 97.5°F | Ht 65.0 in | Wt 194.0 lb

## 2021-01-15 DIAGNOSIS — B001 Herpesviral vesicular dermatitis: Secondary | ICD-10-CM | POA: Diagnosis not present

## 2021-01-15 MED ORDER — VALACYCLOVIR HCL 500 MG PO TABS: 500.0000 mg | ORAL_TABLET | Freq: Two times a day (BID) | ORAL | 2 refills | Status: DC

## 2021-01-15 NOTE — Progress Notes (Signed)
Acute Office Visit  Subjective:    Patient ID: Paige [facility], female    DOB: 1965/08/26, 55 y.o.   MRN: [id number]  Chief Complaint  Patient presents with   mouth sores    Came up a week ago, itching, spreading now around the mouth.     HPI Patient is in today for lower lips with blister and some pain. for one week.  No burning, can eat. No history of herpes.  Past Medical History:  Diagnosis Date   Essential hypertension    Mixed hyperlipidemia    Vitamin D deficiency     Past Surgical History:  Procedure Laterality Date   CHOLECYSTECTOMY      Family History  Problem Relation Age of Onset   Hypertension Other    Prostate cancer Father    Hypertension Maternal Grandfather    Diabetes Maternal Grandfather     Social History   Socioeconomic History   Marital status: Married    Spouse name: Not on file   Number of children: Not on file   Years of education: Not on file   Highest education level: Not on file  Occupational History   Not on file  Tobacco Use   Smoking status: Never   Smokeless tobacco: Never  Substance and Sexual Activity   Alcohol use: Yes    Comment: soically   Drug use: Never   Sexual activity: Not on file  Other Topics Concern   Not on file  Social History Narrative   Not on file   Social Determinants of Health   Financial Resource Strain: Not on file  Food Insecurity: Not on file  Transportation Needs: Not on file  Physical Activity: Not on file  Stress: Not on file  Social Connections: Not on file  Intimate Partner Violence: Not on file    Outpatient Medications Prior to Visit  Medication Sig Dispense Refill   Ascorbic Acid (VITAMIN C) 1000 MG tablet Take 1,000 mg by mouth daily.     atorvastatin (LIPITOR) 20 MG tablet TAKE 1 TABLET BY MOUTH ONCE DAILY 90 tablet 0   losartan (COZAAR) 50 MG tablet Take 1 tablet (50 mg total) by mouth daily. 90 tablet 0   Multiple Vitamin (MULTIVITAMIN) tablet Take 1 tablet by mouth daily.      No facility-administered medications prior to visit.    No Known Allergies  Review of Systems  Constitutional: Negative.   HENT:  Negative for congestion and mouth sores.   Eyes:  Negative for visual disturbance.  Respiratory: Negative.    Cardiovascular:  Negative for chest pain and palpitations.  Gastrointestinal:  Negative for abdominal distention and abdominal pain.  Genitourinary:  Negative for difficulty urinating and dysuria.  Musculoskeletal:  Negative for arthralgias and back pain.  Neurological: Negative.   Psychiatric/Behavioral: Negative.        Objective:    Physical Exam  BP 124/88   Pulse 99   Temp (!) 97.5 F (36.4 C)   Ht 5' 5"  (1.651 m)   Wt 194 lb (88 kg)   SpO2 98%   BMI 32.28 kg/m  Wt Readings from Last 3 Encounters:  01/15/21 194 lb (88 kg)  12/07/20 192 lb (87.1 kg)  12/07/20 192 lb (87.1 kg)    Health Maintenance Due  Topic Date Due   HIV Screening  Never done   Hepatitis C Screening  Never done   Zoster Vaccines- Shingrix (1 of 2) Never done   COVID-19 Vaccine (3 -  Booster for [employer] series) 05/28/2020   INFLUENZA VACCINE  11/23/2020    There are no preventive care reminders to display for this patient.   Lab Results  Component Value Date   TSH 2.790 05/25/2020   Lab Results  Component Value Date   WBC 7.4 12/07/2020   HGB 13.0 12/07/2020   HCT 41.3 12/07/2020   MCV 87 12/07/2020   PLT 239 12/07/2020   Lab Results  Component Value Date   NA 142 12/07/2020   K 4.2 12/07/2020   CO2 24 12/07/2020   GLUCOSE 103 (H) 12/07/2020   BUN 11 12/07/2020   CREATININE 1.06 (H) 12/07/2020   BILITOT 0.4 12/07/2020   ALKPHOS 95 12/07/2020   AST 22 12/07/2020   ALT 20 12/07/2020   PROT 7.3 12/07/2020   ALBUMIN 4.6 12/07/2020   CALCIUM 9.5 12/07/2020   EGFR 63 12/07/2020   Lab Results  Component Value Date   CHOL 159 12/07/2020   Lab Results  Component Value Date   HDL 52 12/07/2020   Lab Results  Component Value Date    LDLCALC 93 12/07/2020   Lab Results  Component Value Date   TRIG 75 12/07/2020   Lab Results  Component Value Date   CHOLHDL 3.1 12/07/2020   Lab Results  Component Value Date   HGBA1C 5.4 05/25/2020       Assessment & Plan:  1. Herpes labialis - valACYclovir (VALTREX) 500 MG tablet; Take 1 tablet (500 mg total) by mouth 2 (two) times daily.  Dispense: 20 tablet; Refill: 2  Patient has herpes labialis without any adenopathy, routine instructions  Meds ordered this encounter  Medications   valACYclovir (VALTREX) 500 MG tablet    Sig: Take 1 tablet (500 mg total) by mouth 2 (two) times daily.    Dispense:  20 tablet    Refill:  2        I spent 20 minutes dedicated to the care of this patient on the date of this encounter to include face-to-face time with the patient, as well as:   Follow-up: Return if symptoms worsen or fail to improve.  An After Visit Summary was printed and given to the patient.  Reinaldo Meeker, MD [facility] [phone]

## 2021-01-15 NOTE — Telephone Encounter (Signed)
Patient was called, LM

## 2021-01-17 NOTE — Patient Instructions (Signed)
Cold Sore A cold sore, also called a fever blister, is a small, fluid-filled sore that forms inside of the mouth or on the lips, gums, nose, chin, or cheeks. Cold sores can spread to other parts of the body, such as the eyes or fingers. Cold sores can spread from person to person (are contagious) until the sores crust over completely. Most cold sores go away within 2 weeks. What are the causes? Cold sores are caused by a virus (herpes simplex virus type 1, HSV-1). The virus can spread from person to person through close contact, such as through: Kissing. Touching the affected area. Sharing personal items such as lip balm, razors, a drinking glass, or eating utensils. What increases the risk? You are more likely to develop this condition if you: Are tired, stressed, or sick. Are having your period (menstruating). Are pregnant. Take certain medicines. Are out in cold weather or get too much sun. What are the signs or symptoms? Symptoms of a cold sore outbreak go through different stages. These are the stages of a cold sore: Tingling, itching, or burning is felt 1-2 days before the outbreak. Fluid-filled blisters appear on the lips, inside the mouth, on the nose, or on the cheeks. The blisters start to ooze clear fluid. The blisters dry up, and a yellow crust appears in their place. The crust falls off. In some cases, other symptoms can develop during a cold sore outbreak. These can include: Fever. Sore throat. Headache. Muscle aches. Swollen neck glands. How is this treated? There is no cure for cold sores or the virus that causes them. There is also no vaccine to prevent the virus. Most cold sores go away on their own without treatment within 2 weeks. Your doctor may prescribe medicines to: Help with pain. Keep the virus from growing. Help you heal faster. Medicines may be in the form of creams, gels, pills, or a shot. Follow these instructions at home: Medicines Take or apply  over-the-counter and prescription medicines only as told by your doctor. Use a cotton-tip swab to apply creams or gels to your sores. Ask your doctor if you can take lysine supplements. These may help with healing. Sore care  Do not touch the sores or pick the scabs. Wash your hands often. Do not touch your eyes without washing your hands first. Keep the sores clean and dry. If told, put ice on the sores: Put ice in a plastic bag. Place a towel between your skin and the bag. Leave the ice on for 20 minutes, 2-3 times a day. Eating and drinking Eat a soft, bland diet. Avoid eating hot, cold, or salty foods. These can hurt your mouth. Use a straw if it hurts to drink out of a glass. Eat foods that have a lot of lysine in them. These include meat, fish, and dairy products. Avoid sugary foods, chocolates, nuts, and grains. These foods have a high amount of a substance (arginine) that can cause the virus to grow. Lifestyle Do not kiss, have oral sex, or share personal items until your sores heal. Stress, poor sleep, and being out in the sun can trigger outbreaks. Make sure you: Do activities that help you relax, such as deep breathing exercises or meditation. Get enough sleep. Apply sunscreen on your lips before you go out in the sun. Contact a doctor if: You have symptoms for more than 2 weeks. You have pus coming from the sores. You have redness that is spreading. You have pain or irritation in   your eye. You get sores on your genitals. Your sores do not heal within 2 weeks. You get cold sores often. Get help right away if: You have a fever and your symptoms suddenly get worse. You have a headache and confusion. You have tiredness (fatigue). You do not want to eat as much as normal (loss of appetite). You have a stiff neck or are sensitive to light. Summary A cold sore is a small, fluid-filled sore that forms inside of the mouth or on the lips, gums, nose, chin, or cheeks. Cold  sores can spread from person to person (are contagious) until the sores crust over completely. Most cold sores go away within 2 weeks. Wash your hands often. Do not touch your eyes without washing your hands first. Do not kiss, have oral sex, or share personal items until your sores heal. Contact a doctor if your sores do not heal within 2 weeks. This information is not intended to replace advice given to you by your health care provider. Make sure you discuss any questions you have with your health care provider. Document Revised: 08/01/2018 Document Reviewed: 09/11/2017 Elsevier Patient Education  2022 [employer] Inc.  

## 2021-01-18 ENCOUNTER — Other Ambulatory Visit: Payer: Self-pay | Admitting: Family Medicine

## 2021-01-18 DIAGNOSIS — Z1231 Encounter for screening mammogram for malignant neoplasm of breast: Secondary | ICD-10-CM

## 2021-01-19 NOTE — Telephone Encounter (Signed)
Patient was called LM

## 2021-01-22 NOTE — Telephone Encounter (Signed)
Patient was called, LM

## 2021-01-26 NOTE — Telephone Encounter (Signed)
Patient is scheduled for next available with tausk

## 2021-03-02 ENCOUNTER — Other Ambulatory Visit (HOSPITAL_COMMUNITY): Payer: Self-pay

## 2021-03-02 ENCOUNTER — Other Ambulatory Visit: Payer: Self-pay | Admitting: Family Medicine

## 2021-03-02 MED ORDER — LOSARTAN POTASSIUM 50 MG PO TABS
50.0000 mg | ORAL_TABLET | Freq: Every day | ORAL | 0 refills | Status: DC
  Filled 2021-03-02: qty 90, 90d supply, fill #0

## 2021-03-02 MED ORDER — ATORVASTATIN CALCIUM 20 MG PO TABS
20.0000 mg | ORAL_TABLET | Freq: Every day | ORAL | 0 refills | Status: DC
  Filled 2021-03-02: qty 90, 90d supply, fill #0

## 2021-03-17 ENCOUNTER — Encounter: Payer: Self-pay | Admitting: Family Medicine

## 2021-03-17 ENCOUNTER — Ambulatory Visit: Payer: 59 | Admitting: Family Medicine

## 2021-03-17 VITALS — BP 130/86 | HR 78 | Temp 97.3°F | Resp 18 | Wt 197.0 lb

## 2021-03-17 DIAGNOSIS — J019 Acute sinusitis, unspecified: Secondary | ICD-10-CM | POA: Insufficient documentation

## 2021-03-17 DIAGNOSIS — J018 Other acute sinusitis: Secondary | ICD-10-CM

## 2021-03-17 LAB — POC COVID19 BINAXNOW: SARS Coronavirus 2 Ag: NEGATIVE

## 2021-03-17 LAB — POCT RAPID STREP A (OFFICE): Rapid Strep A Screen: NEGATIVE

## 2021-03-17 LAB — POCT INFLUENZA A/B
Influenza A, POC: NEGATIVE
Influenza B, POC: NEGATIVE

## 2021-03-17 MED ORDER — AMOXICILLIN 875 MG PO TABS: 875.0000 mg | ORAL_TABLET | Freq: Two times a day (BID) | ORAL | 0 refills | Status: DC

## 2021-03-17 NOTE — Assessment & Plan Note (Signed)
Prescription for amoxicillin 875 mg twice daily x10 days. May continue DayQuil and NyQuil. Tylenol, ibuprofen or Aleve for any sore throat or fevers that might occur.

## 2021-03-17 NOTE — Progress Notes (Signed)
Acute Office Visit  Subjective:    Patient ID: Paige Hardy, female    DOB: [birth year], 55 y.o.   MRN: [id number]  Chief Complaint  Patient presents with   Sore Throat   Nasal Congestion    HPI: Patient is in today for runny nose, nasal congestion, sore throat, cough x2 days.  No headaches.  No fevers or chills.  Patient has tried DayQuil and NyQuil and gets temporary relief.  Past Medical History:  Diagnosis Date   Essential hypertension    Mixed hyperlipidemia    Vitamin D deficiency     Past Surgical History:  Procedure Laterality Date   CHOLECYSTECTOMY      Family History  Problem Relation Age of Onset   Hypertension Other    Prostate cancer Father    Hypertension Maternal Grandfather    Diabetes Maternal Grandfather     Social History   Socioeconomic History   Marital status: Married    Spouse name: Not on file   Number of children: Not on file   Years of education: Not on file   Highest education level: Not on file  Occupational History   Not on file  Tobacco Use   Smoking status: Never   Smokeless tobacco: Never  Substance and Sexual Activity   Alcohol use: Yes    Comment: soically   Drug use: Never   Sexual activity: Not on file  Other Topics Concern   Not on file  Social History Narrative   Not on file   Social Determinants of Health   Financial Resource Strain: Not on file  Food Insecurity: Not on file  Transportation Needs: Not on file  Physical Activity: Not on file  Stress: Not on file  Social Connections: Not on file  Intimate Partner Violence: Not on file    Outpatient Medications Prior to Visit  Medication Sig Dispense Refill   Ascorbic Acid (VITAMIN C) 1000 MG tablet Take 1,000 mg by mouth daily.     atorvastatin (LIPITOR) 20 MG tablet TAKE 1 TABLET BY MOUTH ONCE DAILY 90 tablet 0   losartan (COZAAR) 50 MG tablet Take 1 tablet (50 mg total) by mouth daily. 90 tablet 0   Multiple Vitamin (MULTIVITAMIN) tablet Take 1 tablet  by mouth daily.     valACYclovir (VALTREX) 500 MG tablet Take 1 tablet (500 mg total) by mouth 2 (two) times daily. 20 tablet 2   No facility-administered medications prior to visit.    No Known Allergies  Review of Systems  Constitutional:  Negative for chills and fever.  HENT:  Positive for congestion, rhinorrhea and sore throat.   Respiratory:  Positive for cough.       Objective:    Physical Exam Vitals reviewed.  Constitutional:      Appearance: Normal appearance. She is normal weight.  HENT:     Right Ear: Tympanic membrane, ear canal and external ear normal.     Left Ear: Tympanic membrane, ear canal and external ear normal.     Nose: Congestion present.     Mouth/Throat:     Pharynx: Posterior oropharyngeal erythema present. No oropharyngeal exudate.  Neck:     Vascular: No carotid bruit.  Cardiovascular:     Rate and Rhythm: Normal rate and regular rhythm.     Pulses: Normal pulses.     Heart sounds: Normal heart sounds. No murmur heard. Pulmonary:     Effort: Pulmonary effort is normal. No respiratory distress.  Breath sounds: Normal breath sounds.  Abdominal:     General: Abdomen is flat. Bowel sounds are normal.     Palpations: Abdomen is soft.     Tenderness: There is no abdominal tenderness.  Neurological:     Mental Status: She is alert and oriented to person, place, and time.  Psychiatric:        Mood and Affect: Mood normal.        Behavior: Behavior normal.    There were no vitals taken for this visit. Wt Readings from Last 3 Encounters:  01/15/21 194 lb (88 kg)  12/07/20 192 lb (87.1 kg)  12/07/20 192 lb (87.1 kg)    Health Maintenance Due  Topic Date Due   HIV Screening  Never done   Hepatitis C Screening  Never done   Zoster Vaccines- Shingrix (1 of 2) Never done   COVID-19 Vaccine (3 - Booster for Pfizer series) 02/21/2020   INFLUENZA VACCINE  11/23/2020    There are no preventive care reminders to display for this  patient.   Lab Results  Component Value Date   TSH 2.790 05/25/2020   Lab Results  Component Value Date   WBC 7.4 12/07/2020   HGB 13.0 12/07/2020   HCT 41.3 12/07/2020   MCV 87 12/07/2020   PLT 239 12/07/2020   Lab Results  Component Value Date   NA 142 12/07/2020   K 4.2 12/07/2020   CO2 24 12/07/2020   GLUCOSE 103 (H) 12/07/2020   BUN 11 12/07/2020   CREATININE 1.06 (H) 12/07/2020   BILITOT 0.4 12/07/2020   ALKPHOS 95 12/07/2020   AST 22 12/07/2020   ALT 20 12/07/2020   PROT 7.3 12/07/2020   ALBUMIN 4.6 12/07/2020   CALCIUM 9.5 12/07/2020   EGFR 63 12/07/2020   Lab Results  Component Value Date   CHOL 159 12/07/2020   Lab Results  Component Value Date   HDL 52 12/07/2020   Lab Results  Component Value Date   LDLCALC 93 12/07/2020   Lab Results  Component Value Date   TRIG 75 12/07/2020   Lab Results  Component Value Date   CHOLHDL 3.1 12/07/2020   Lab Results  Component Value Date   HGBA1C 5.4 05/25/2020       Assessment & Plan:   Problem List Items Addressed This Visit   None  No orders of the defined types were placed in this encounter.   No orders of the defined types were placed in this encounter.    Follow-up: No follow-ups on file.  An After Visit Summary was printed and given to the patient.  Rochel Brome, MD [facility] [phone]

## 2021-05-12 ENCOUNTER — Ambulatory Visit: Payer: Medicaid Other | Admitting: Dermatology

## 2021-05-26 ENCOUNTER — Ambulatory Visit
Admission: RE | Admit: 2021-05-26 | Discharge: 2021-05-26 | Disposition: A | Discharge status: Home / Self Care | Payer: 59 | Source: Ambulatory Visit | Attending: Family Medicine | Admitting: Family Medicine

## 2021-05-26 ENCOUNTER — Other Ambulatory Visit: Payer: Self-pay

## 2021-05-26 DIAGNOSIS — Z1231 Encounter for screening mammogram for malignant neoplasm of breast: Secondary | ICD-10-CM | POA: Diagnosis not present

## 2021-06-02 ENCOUNTER — Other Ambulatory Visit: Payer: Self-pay | Admitting: Family Medicine

## 2021-06-02 ENCOUNTER — Other Ambulatory Visit (HOSPITAL_COMMUNITY): Payer: Self-pay

## 2021-06-02 MED ORDER — LOSARTAN POTASSIUM 50 MG PO TABS
50.0000 mg | ORAL_TABLET | Freq: Every day | ORAL | 0 refills | Status: DC
  Filled 2021-06-02: qty 90, 90d supply, fill #0

## 2021-06-02 MED ORDER — ATORVASTATIN CALCIUM 20 MG PO TABS
20.0000 mg | ORAL_TABLET | Freq: Every day | ORAL | 0 refills | Status: DC
  Filled 2021-06-02: qty 90, 90d supply, fill #0

## 2021-06-15 ENCOUNTER — Ambulatory Visit: Payer: Medicaid Other | Admitting: Dermatology

## 2021-07-05 ENCOUNTER — Other Ambulatory Visit: Payer: Self-pay

## 2021-07-05 DIAGNOSIS — B001 Herpesviral vesicular dermatitis: Secondary | ICD-10-CM

## 2021-07-05 MED ORDER — VALACYCLOVIR HCL 500 MG PO TABS
500.0000 mg | ORAL_TABLET | Freq: Two times a day (BID) | ORAL | 2 refills | Status: DC
  Filled 2021-07-19: qty 20, 10d supply, fill #0

## 2021-07-19 ENCOUNTER — Other Ambulatory Visit (HOSPITAL_COMMUNITY): Payer: Self-pay

## 2021-07-27 ENCOUNTER — Other Ambulatory Visit (HOSPITAL_COMMUNITY): Payer: Self-pay

## 2021-09-03 ENCOUNTER — Other Ambulatory Visit: Payer: Self-pay | Admitting: Family Medicine

## 2021-09-03 ENCOUNTER — Other Ambulatory Visit (HOSPITAL_COMMUNITY): Payer: Self-pay

## 2021-09-06 ENCOUNTER — Other Ambulatory Visit (HOSPITAL_COMMUNITY): Payer: Self-pay

## 2021-09-06 MED ORDER — LOSARTAN POTASSIUM 50 MG PO TABS
50.0000 mg | ORAL_TABLET | Freq: Every day | ORAL | 0 refills | Status: DC
  Filled 2021-09-06: qty 90, 90d supply, fill #0

## 2021-09-06 MED ORDER — ATORVASTATIN CALCIUM 20 MG PO TABS
20.0000 mg | ORAL_TABLET | Freq: Every day | ORAL | 0 refills | Status: DC
  Filled 2021-09-06: qty 90, 90d supply, fill #0

## 2021-11-26 ENCOUNTER — Other Ambulatory Visit (HOSPITAL_COMMUNITY): Payer: Self-pay

## 2021-11-26 ENCOUNTER — Other Ambulatory Visit: Payer: Self-pay | Admitting: Family Medicine

## 2021-11-28 ENCOUNTER — Other Ambulatory Visit (HOSPITAL_COMMUNITY): Payer: Self-pay

## 2021-11-28 MED ORDER — LOSARTAN POTASSIUM 50 MG PO TABS
50.0000 mg | ORAL_TABLET | Freq: Every day | ORAL | 0 refills | Status: DC
  Filled 2021-11-28: qty 90, 90d supply, fill #0

## 2021-11-28 MED ORDER — ATORVASTATIN CALCIUM 20 MG PO TABS
20.0000 mg | ORAL_TABLET | Freq: Every day | ORAL | 0 refills | Status: DC
  Filled 2021-11-28: qty 90, 90d supply, fill #0

## 2021-11-29 ENCOUNTER — Other Ambulatory Visit (HOSPITAL_COMMUNITY): Payer: Self-pay

## 2021-12-13 ENCOUNTER — Other Ambulatory Visit: Payer: Self-pay

## 2021-12-13 ENCOUNTER — Ambulatory Visit: Payer: Medicaid Other | Admitting: Dermatology

## 2021-12-13 ENCOUNTER — Encounter: Payer: Self-pay | Admitting: Student in an Organized Health Care Education/Training Program

## 2021-12-13 ENCOUNTER — Ambulatory Visit
Admission: RE | Admit: 2021-12-13 | Discharge: 2021-12-13 | Disposition: A | Discharge status: Home / Self Care | Payer: Medicaid Other | Source: Ambulatory Visit

## 2021-12-13 VITALS — Wt 257.0 lb

## 2021-12-13 DIAGNOSIS — M2550 Pain in unspecified joint: Secondary | ICD-10-CM

## 2021-12-13 DIAGNOSIS — L409 Psoriasis, unspecified: Secondary | ICD-10-CM

## 2021-12-13 DIAGNOSIS — M79672 Pain in left foot: Secondary | ICD-10-CM

## 2021-12-13 DIAGNOSIS — M79671 Pain in right foot: Secondary | ICD-10-CM

## 2021-12-13 DIAGNOSIS — Z79899 Other long term (current) drug therapy: Secondary | ICD-10-CM

## 2021-12-13 MED ORDER — BETAMETHASONE DIPROPIONATE 0.05 % EX OINT *I*
TOPICAL_OINTMENT | Freq: Two times a day (BID) | CUTANEOUS | 2 refills | Status: DC | PRN
Start: 2021-12-13 — End: 2022-09-29

## 2021-12-13 MED ORDER — CALCIPOTRIENE-BETAMETH DIPROP 0.005-0.064 % EX OINT *A*
TOPICAL_OINTMENT | Freq: Every day | CUTANEOUS | 2 refills | Status: DC
Start: 2021-12-13 — End: 2022-03-15

## 2021-12-13 MED ORDER — CALCIPOTRIENE 0.005 % EX OINT *A*
TOPICAL_OINTMENT | Freq: Two times a day (BID) | CUTANEOUS | 2 refills | Status: DC
Start: 2021-12-13 — End: 2022-09-27

## 2021-12-13 NOTE — Result Encounter Note (Signed)
Xrays of bilateral feet reviewed, joint space maintained without productive or erosive changes; small plantar and retrocalcaneal spur; no acute findings. Will communicate results to patient via MyChart.  ?  ?  Truitt Merle, MD  Dermatology Resident PGY-3

## 2021-12-13 NOTE — Patient Instructions (Signed)
Psoriasis  - Start taclonex (calcipotriene 0.005%-betamethasone dipropionate 0.064%) ointment twice daily as needed to the itchy, red, and scaly affected areas on the body   - If your insurance will not cover the medication, prescribing calcipotriene and betamethasone separately should decrease the cost   - Apply ointment, then cover with saran wrap    - Do not use on face, underarms, or groin   - Side effects: lightening or thinning of the skin, especially if used on areas where you don't have rash   - Bilateral XR of feet ordered today; call [phone] to schedule

## 2021-12-13 NOTE — Progress Notes (Addendum)
Dermatology Progress Note    Chief Complaint   Patient presents with   ? New Patient Visit     psoriasis       Derm History and Relevant Medical History:   - previously followed with Dr. Neva Seat   - psoriasis, for ~22 years   - prior tx: ILK, clobetasol with some improvement   ?  HPI:   Loretta [name] is a pleasant 56 y.o. female here for the concern noted below.  ?  Last visit: NPV  Concern:   Psoriasis  - plaques occur on the scalp, elbows, nose, lower legs   - pain in knees, ankles, dorsal feet, shins; pain is less severe when she is moving consistently. In the morning or after staying in one position for a prolonged period, then the pain and stiffness is worse.   - she has not been seen by dermatology in 10 years   - history of diverticulitis, but no personal or family hx of IBD  - her son also has psoriasis   ?  Social Hx: Works as a Surveyor, mining.     Physical Exam:   Vitals:    12/13/21 1416   Weight: 116.6 kg (257 lb)     Skin: All of the following were examined, and were within normal limits, except as noted: Face, Scalp/Hair, Extremities (RUE/LUE) and Extremities (RLE/LLE) - per patient preference     Well demarcated, red, scaly plaques on b/l shins, left anterior ankle, left calf   ??    Assessment/Plan:    Psoriasis  Arthralgias consistent with possible psoriatic arthritis   - Diagnosis and treatment discussed; failed clobetasol as above  - Start taclonex (calcipotriene 0.005%-betamethasone dipropionate 0.064%) ointment twice daily as needed to the itchy, red, and scaly affected areas on the body   - Apply ointment, then cover with saran wrap    - Do not use on face, underarms, or groin   - Side effects: lightening or thinning of the skin, especially if used on areas where you don't have rash    - If your insurance will not cover the medication, prescribing calcipotriene and betamethasone separately should decrease the cost  - Bilateral XR of feet ordered today; call [phone] to schedule      Received fax stating taclonex is not covered by patient's insurance. Prescriptions for betamethasone and calcipotriene sent to pharmacy to use as above. Pt updated via MyChart.      Return to clinic: next available [facility] clinic    Seen and discussed with Dr. Corinne Ports, MD  Dermatology Resident PGY-3

## 2021-12-13 NOTE — Result Encounter Note (Signed)
Xrays of bilateral feet reviewed, joint space maintained without productive or erosive changes; small plantar and retrocalcaneal spur; no acute findings. Will communicate results to patient via MyChart.  ?  ?  Loretta Kachel D'Angelo, MD  Dermatology Resident PGY-3

## 2021-12-21 ENCOUNTER — Encounter: Payer: Self-pay | Admitting: Student in an Organized Health Care Education/Training Program

## 2021-12-21 ENCOUNTER — Telehealth: Payer: Self-pay | Admitting: Student in an Organized Health Care Education/Training Program

## 2021-12-21 NOTE — Telephone Encounter (Signed)
Covering for Dr Matthias Hughs, received MyChart notification that patient had not seen message regarding taclonex not being covered by insurance and scripts being sent for betamethasone and calcipotriene separately instead.    Called patient, who said that she was able to get the calcipotriene but not bethamethasone, was told prior auth needed. Patient expressed gratitude for the call.    Sent message to prior auth pool.

## 2021-12-21 NOTE — Progress Notes (Signed)
Insurance is wanting to know if you'd be willing to switch to a preferred drug options are listed above

## 2022-01-05 ENCOUNTER — Ambulatory Visit: Payer: Medicaid Other | Admitting: Rheumatology

## 2022-01-05 ENCOUNTER — Ambulatory Visit: Payer: Medicaid Other | Admitting: Dermatology

## 2022-01-05 ENCOUNTER — Encounter: Payer: Self-pay | Admitting: Rheumatology

## 2022-01-05 ENCOUNTER — Other Ambulatory Visit: Payer: Self-pay

## 2022-01-05 ENCOUNTER — Other Ambulatory Visit
Admission: RE | Admit: 2022-01-05 | Discharge: 2022-01-05 | Disposition: A | Discharge status: Home / Self Care | Payer: Medicaid Other | Source: Ambulatory Visit | Attending: Rheumatology | Admitting: Rheumatology

## 2022-01-05 VITALS — Ht 63.5 in | Wt 257.0 lb

## 2022-01-05 VITALS — BP 166/95 | Ht 63.5 in | Wt 257.0 lb

## 2022-01-05 DIAGNOSIS — K5792 Diverticulitis of intestine, part unspecified, without perforation or abscess without bleeding: Secondary | ICD-10-CM

## 2022-01-05 DIAGNOSIS — L409 Psoriasis, unspecified: Secondary | ICD-10-CM | POA: Insufficient documentation

## 2022-01-05 DIAGNOSIS — J302 Other seasonal allergic rhinitis: Secondary | ICD-10-CM

## 2022-01-05 DIAGNOSIS — Z79899 Other long term (current) drug therapy: Secondary | ICD-10-CM

## 2022-01-05 DIAGNOSIS — L4050 Arthropathic psoriasis, unspecified: Secondary | ICD-10-CM

## 2022-01-05 HISTORY — DX: Psoriasis, unspecified: L40.9

## 2022-01-05 LAB — CRP: CRP: 6 mg/L (ref 0–8)

## 2022-01-05 LAB — SEDIMENTATION RATE, AUTOMATED: Sedimentation Rate: 32 mm/hr — ABNORMAL HIGH (ref 0–30)

## 2022-01-05 NOTE — Patient Instructions (Signed)
-   Continue calcipotriene 0.005% and betamethasone dipropionate 0.064% ointments twice daily as needed to the itchy, red, and scaly affected areas on the body   - Apply ointment, then cover with saran wrap at night   -During the day, you can cover with tegaderm dressings   - Do not use on face, underarms, or groin   - Side effects: lightening or thinning of the skin, especially if used on areas where you don't have rash

## 2022-01-05 NOTE — Progress Notes (Signed)
Rheumatology New Patient Consult Note    PRIMARY CARE PHYSICIAN:  Gaspar Skeeters, DO  REFERRING PHYSICIAN:  Gaspar Skeeters, DO    CHIEF COMPLAINT:  joint pain in the setting of psoirasis     HPI:   Loretta [name] is a 56 y.o. year old Caucasian female who is referred for evaluation of joint pain in the setting of psoriasis.    Loretta [name] is willing to be contacted regarding research participation: Yes.    She has had psoriasis, for ~22 years and was previously followed by Dr. Neva Seat. Her last appt with him was about 10 years ago. Prior treatment    ILK, clobetasol with some improvement. She now has  plaques occur on the scalp, elbows, nose, lower legs. She also c/o - pain in knees, ankles, dorsal feet, shins; pain is less severe when she is moving consistently. In the morning or after staying in one position for a prolonged period, then the pain and stiffness is worse.    - history of diverticulitis, but no personal or family hx of IBD  - her son also has psoriasis    The joint pain started about 2 years ago and worsened over the last year. She has am stiffness but no uveitis or colitis.     Average joint pain 3-5 and it slows her down.       PAST MEDICAL HISTORY:  Patient Active Problem List   Diagnosis Code    Seasonal allergic rhinitis J30.2    Diverticulitis K57.92    Psoriasis L40.9     Past Medical History:   Diagnosis Date    Anxiety     Psoriasis 01/05/2022     Past Surgical History:   Procedure Laterality Date    APPENDECTOMY      CHOLECYSTECTOMY      PR ARTHRS KNE SURG W/MENISCECTOMY MED/LAT W/SHVG Right 04/03/2018    Procedure: ARTHROSCOPY, KNEE, WITH MENISCECTOMY;  Surgeon: Roselie Awkward, MD;  Location: [facility];  Service: Orthopedics    TUBAL LIGATION         CURRENT MEDICATIONS:   Outpatient Encounter Medications as of 01/05/2022   Medication Sig Dispense Refill    calcipotriene-betamethasone (TACLONEX) ointment Apply topically daily  to the following areas: red scaly plaques on elbows and  legs 60 g 2    betamethasone dipropionate (DIPROSONE) 0.05 % ointment Apply topically 2 times daily as needed for Rash  to the following areas: red, scaly areas on the body. Do not use on face, underarms, groin. 50 g 2    calcipotriene (DOVONOX) 0.005 % ointment Apply topically 2 times daily  Apply to red scaly areas on the body two times daily. 60 g 2    traZODone (DESYREL) 50 MG tablet Take 100 mg by mouth nightly as needed      valACYclovir (VALTREX) 1 GM tablet Take 2 tabs 2x daily for 1 day as needed (Patient not taking: No sig reported)       No facility-administered encounter medications on file as of 01/05/2022.         ALLERGIES: Latex, Penicillins, Sulfa antibiotics, and Asa [aspirin]    FAMILY HISTORY:   Family History   Problem Relation Age of Onset    High Blood Pressure Mother     Cataracts Mother     Depression Mother     Arthritis Mother     Diabetes Mother     Neuromuscular disorder Mother     Neuromuscular disorder  Sister     Arthritis Sister     Depression Sister     Diabetes Sister     High Blood Pressure Sister        SOCIAL HISTORY:   Social History     Socioeconomic History    Marital status: Divorced   Tobacco Use    Smoking status: Never    Smokeless tobacco: Never   Substance and Sexual Activity    Alcohol use: Yes     Comment: Socially     Drug use: Never         ROS :   Constitutional: Normal  Musculoskeletal: Normal  Skin: Normal  Eyes: Normal  Ears/Nose/Mouth/Throat: Normal  Respiratory: Normal  Cardiac: Normal  Gastrointestinal: Normal  Genitourinary: Normal  Endocrine: Normal  Allergy/Immunologic: Normal  Hematologic: Normal  Neurologic: Normal  Psychiatric: Normal    PHYSICAL EXAMINATION:  BP (!) 166/95 (BP Location: Left arm, Patient Position: Sitting)   Ht 1.613 m (5' 3.5")   Wt 116.6 kg (257 lb)   BMI 44.81 kg/m   Body mass index is 44.81 kg/m.    Pain    01/05/22 1052   PainSc:   5   PainLoc: Foot      General: Well-appearing, alert, and in no acute distress   Psych: Normal  affect  Skin: Psoriasis See Derm note for details  Eye: no conjunctival erythema, no eyelid or periorbial edema, pupils equal and round  ENT: moist mucous membranes, no oral lesions, oropharynx clear, normal salivary pooling, ears normal in size, shape, and position, normal dentition  Neck: supple, trachea midline, no thyromegaly   Lymph:no cervical adenopathy, no supraclavicular adenopathy   Lungs:breathing comfortably on room air, lungs clear without wheezes, rhonchi, or rales   YN:WGNFAOZ rate and rhythm. No murmurs, rubs, or gallops. Normal S1 and S2.   HY:QMVH, non-tender, normoactive bowel sounds   Extremity: equal pulses, no cyanosis, no edema  Neurologic: Alert and oriented, mental status normal   Musculoskeletal:   Physical Exam          PRIOR STUDIES:   Labs: - Recent labs reviewed -- please see ERecord results review section for values.    RADIOLOGY: -Recent imaging reports reviewed -- please see ERecord imaging section for details.    IMPRESSION:  Joint pain and swelling in this patient with psoriasis. DDX is PsA, RA or reactive arthritis. Will obtain xrays and blood work and discuss treatment options once these data are available. Return visit scheduled.     RECOMMENDATIONS:   1. Diverticulitis        2. Seasonal allergic rhinitis        3. Psoriasis          PLAN:    Xrays of knees, feet and hands  CCP Ab, RF, ESR, CRP  Followup visit scheduled.

## 2022-01-05 NOTE — Progress Notes (Signed)
Dermatology Progress Note  Chief Complaint   Patient presents with    Follow-up     Spot of concern      Derm History:   - previously followed with Dr. Neva Seat  Psoriasis: for ~22 years   - prior tx: ILK, clobetasol with some improvement   - insurance denied taclonex  - 01/05/2022: current tx calcipotriene, betamethasone,     Relevant Medical History:  - history of diverticulitis, but no personal or family hx of IBD  ?  HPI:   Loretta [name] is a pleasant 56 y.o. female here for the concern noted below.  ?  Last visit: 12/13/2021  Today 01/05/2022:   -No change with calcipotriene + betamethasone. Notes that she did have clearance in the past with ILK.  -Right foot, b/l ankles, achilles, R>L knee, low back, fingers are all painful    ?  Family Hx: son has psoriasis  Social Hx:  Works as a Surveyor, mining.     Physical Exam:   Vitals:    01/05/22 1050   Weight: 116.6 kg (257 lb)   Height: 1.613 m (5' 3.5")     Skin: All of the following were examined, and were within normal limits, except as noted: Face, Scalp/Hair, Extremities (RUE/LUE) and Extremities (RLE/LLE)  -Well demarcated, red, scaly plaques on b/l shins, left anterior ankle, left calf     Updated photo in media tab  ??    Assessment/Plan:  Psoriasis  - Continue calcipotriene 0.005% and betamethasone dipropionate 0.064% ointments twice daily as needed to the itchy, red, and scaly affected areas on the body   - Apply ointment, then cover with saran wrap at night   -During the day, you can cover with tegaderm dressings   - Do not use on face, underarms, or groin   - Side effects: lightening or thinning of the skin, especially if used on areas where you don't have rash   - Systemic per Rheum; likely Humira  - Given paperwork for diet study      Return to clinic: 8 weeks PsA    Lorn Junes, MD,PhD  Dermatology Resident PGY-4  01/05/22 11:00 AM

## 2022-01-06 LAB — CYCLIC CITRULLINATED PEPTIDE: Cyclic Citrullin Peptide Ab: <0.5 U/mL (ref 0.0–2.9)

## 2022-01-06 LAB — ANTINUCLEAR ANTIBODY SCREEN: ANA Screen: NEGATIVE

## 2022-01-06 LAB — RHEUMATOID FACTOR,SCREEN: Rheumatoid Factor: <8 IU/mL

## 2022-01-10 LAB — TB AG T-CELL STIMULATION: TB AG T-Cell Stim: NEGATIVE IU/mL

## 2022-01-12 ENCOUNTER — Ambulatory Visit
Admission: RE | Admit: 2022-01-12 | Discharge: 2022-01-12 | Disposition: A | Discharge status: Home / Self Care | Payer: Medicaid Other | Source: Ambulatory Visit

## 2022-01-12 ENCOUNTER — Ambulatory Visit: Payer: Medicaid Other

## 2022-01-12 ENCOUNTER — Other Ambulatory Visit: Payer: Self-pay

## 2022-01-12 DIAGNOSIS — L409 Psoriasis, unspecified: Secondary | ICD-10-CM

## 2022-01-12 DIAGNOSIS — G8929 Other chronic pain: Secondary | ICD-10-CM

## 2022-01-12 DIAGNOSIS — M25551 Pain in right hip: Secondary | ICD-10-CM

## 2022-01-12 DIAGNOSIS — M25562 Pain in left knee: Secondary | ICD-10-CM

## 2022-01-12 DIAGNOSIS — M79641 Pain in right hand: Secondary | ICD-10-CM

## 2022-01-12 DIAGNOSIS — M7989 Other specified soft tissue disorders: Secondary | ICD-10-CM

## 2022-01-12 DIAGNOSIS — M79672 Pain in left foot: Secondary | ICD-10-CM

## 2022-01-12 DIAGNOSIS — M25542 Pain in joints of left hand: Secondary | ICD-10-CM

## 2022-01-12 DIAGNOSIS — M79671 Pain in right foot: Secondary | ICD-10-CM

## 2022-01-12 DIAGNOSIS — M25541 Pain in joints of right hand: Secondary | ICD-10-CM

## 2022-01-12 DIAGNOSIS — M79642 Pain in left hand: Secondary | ICD-10-CM

## 2022-01-12 DIAGNOSIS — M25461 Effusion, right knee: Secondary | ICD-10-CM

## 2022-01-12 DIAGNOSIS — M25561 Pain in right knee: Secondary | ICD-10-CM

## 2022-01-12 DIAGNOSIS — M25462 Effusion, left knee: Secondary | ICD-10-CM

## 2022-01-12 DIAGNOSIS — M25552 Pain in left hip: Secondary | ICD-10-CM

## 2022-01-12 NOTE — Procedures (Signed)
1. Musculoskeletal ultrasound evaluation of the right hand and wrist.    Indication: History of pain and swelling.  Evaluate for synovitis or tenosynovitis.    Studies are performed on a GE Logiq E10 unit.  High-frequency ultrasonography at 15 MHz is used.  Gray scale and power Doppler studies are obtained.  Tendons, ligaments, nerve, joint capsule, tendon sheath and bony structures are visualized.    Findings: The wrist is visualized from a dorsal, volar, radial and ulnar aspect.  MCP joints 2 through 5 are visualized from a dorsal aspect.  PIP joints 2 through 5 are visualized from a volar aspect.    No increased fluid collection is seen in the dorsal recesses of radial carpal and midcarpal joints.  No increased fluid collection or synovial proliferation is seen in the examined MCP joints.  No increased fluid collection or synovial proliferation seen in PIP joints.  No bony erosions are seen.  No increased power Doppler signal seen in any of the examined joint areas.    Impression: No inflammatory changes of the wrist and finger joints by ultrasound criteria.      2. Musculoskeletal ultrasound evaluation of the left hand and wrist.    Indication: History of pain and swelling.  Evaluate for synovitis or tenosynovitis.    Studies are performed on a GE Logiq E10 unit.  High-frequency ultrasonography at 15 MHz is used.  Gray scale and power Doppler studies are obtained.  Tendons, ligaments, nerve, joint capsule, tendon sheath and bony structures are visualized.    Findings: The wrist is visualized from a dorsal, volar, radial and ulnar aspect.  MCP joints 2 through 5 are visualized from a dorsal aspect.  PIP joints 2 through 5 are visualized from a volar aspect.    No increased fluid collection is seen in the dorsal recesses of radial carpal and midcarpal joints.  No increased fluid collection or synovial proliferation is seen in the examined MCP joints.  No increased fluid collection or synovial proliferation seen in  PIP joints.  No bony erosions are seen.  No increased power Doppler signal seen in any of the examined joint areas.    Impression: No inflammatory changes of the wrist and finger joints by ultrasound criteria.      3. Musculoskeletal ultrasound examination of the right forefoot.    Indication: History of pain and swelling.  Evaluate for synovitis or crystal deposition disease.    High-frequency ultrasonography at 15 MHz is used.  Gray scale and power Doppler studies are obtained.  Studies are performed on a GE Logiq E10 unit.  Bony structures, cartilage, joint recess and tendons are visualized.    Findings: Metatarsophalangeal joints 1 through 5 are visualized from a dorsal aspect.  In addition, MTP 1 is visualized from the medial aspect and MTP 5 from a lateral aspect.  MTP joints 1 and 2 are also visualized from a plantar aspect.    No increased fluid collection is seen in the examined joint areas.  No bony erosion is appreciated.  No crystalline deposition is noted.  No sonographic double contour sign is seen.  No abnormal power Doppler signal is noted.    Impression: No inflammatory changes of the right forefoot by sonographic criteria.      4. Musculoskeletal ultrasound examination of the left forefoot.    Indication: History of pain and swelling.  Evaluate for synovitis or crystal deposition disease.    High-frequency ultrasonography at 15 MHz is used.  Gray scale and power  Doppler studies are obtained.  Studies are performed on a GE Logiq E10 unit.  Bony structures, cartilage, joint recess and tendons are visualized.    Findings: Metatarsophalangeal joints 1 through 5 are visualized from a dorsal aspect.  In addition, MTP 1 is visualized from the medial aspect and MTP 5 from a lateral aspect.  MTP joints 1 and 2 are also visualized from a plantar aspect.    No increased fluid collection is seen in the examined joint areas.  No bony erosion is appreciated.  No crystalline deposition is noted.  No sonographic  double contour sign is seen.  No abnormal power Doppler signal is noted.    Impression: No inflammatory changes of the left forefoot by sonographic criteria.    5.  Musculoskeletal ultrasound examination of the right knee.    Indication: Pain and swelling.  Evaluate for synovitis, bursitis, tendon injury, crystalline arthropathy or Baker's cyst.    High-frequency ultrasonography at 15 MHz is used.  Gray scale and power Doppler studies are obtained.  Studies are performed on a GE Logiq E10 unit.  Tendons, patellar ligament, joint recess, fibrocartilage, hyaline cartilage, bursa space and bony structures are visualized.    Findings: The knee is visualized in anterior suprapatellar long and short axis views as well as suprapatellar flexion views, infrapatellar views, medial, lateral and dorsal views.    No increased fluid collection is seen in the suprapatellar joint recess.  The quadriceps tendon is intact.  Flexion views show an intact hyaline cartilage over the femoral condyles.  Infrapatellar views show an intact patella ligament.  From medial and lateral, no increased fluid collection is appreciated.  Spur formation is seen at the medial joint line.  From posterior, there is no increased fluid collection seen in the semimembranosus/gastrocnemius bursa.    Impression: No inflammatory changes of the knee appreciated.  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    6.  Musculoskeletal ultrasound examination of the left knee.    Indication: Pain and swelling.  Evaluate for synovitis, bursitis, tendon injury, crystalline arthropathy or Baker's cyst.    High-frequency ultrasonography at 15 MHz is used.  Gray scale and power Doppler studies are obtained.  Studies are performed on a GE Logiq E10 unit.  Tendons, patellar ligament, joint recess, fibrocartilage, hyaline cartilage, bursa space and bony structures are visualized.    Findings: The knee is visualized in anterior suprapatellar long and short axis views  as well as suprapatellar flexion views, infrapatellar views, medial, lateral and dorsal views.    No increased fluid collection is seen in the suprapatellar joint recess.  The quadriceps tendon is intact.  Small spur at the insertion of the QT.  Flexion views show an intact hyaline cartilage over the femoral condyles.  Infrapatellar views show an intact patella ligament.  From medial and lateral, no increased fluid collection is appreciated.  From posterior, there is no increased fluid collection seen in the semimembranosus/gastrocnemius bursa.    Impression: No inflammatory changes of the knee appreciated.

## 2022-02-23 ENCOUNTER — Ambulatory Visit: Payer: 59 | Admitting: Nurse Practitioner

## 2022-02-23 ENCOUNTER — Ambulatory Visit (HOSPITAL_BASED_OUTPATIENT_CLINIC_OR_DEPARTMENT_OTHER)
Admission: RE | Admit: 2022-02-23 | Discharge: 2022-02-23 | Disposition: A | Discharge status: Home / Self Care | Payer: 59 | Source: Ambulatory Visit | Attending: Nurse Practitioner | Admitting: Nurse Practitioner

## 2022-02-23 ENCOUNTER — Other Ambulatory Visit (HOSPITAL_COMMUNITY): Payer: Self-pay

## 2022-02-23 ENCOUNTER — Encounter: Payer: Self-pay | Admitting: Nurse Practitioner

## 2022-02-23 VITALS — BP 130/80 | HR 86 | Temp 97.3°F | Ht 65.0 in | Wt 205.0 lb

## 2022-02-23 DIAGNOSIS — Z8781 Personal history of (healed) traumatic fracture: Secondary | ICD-10-CM | POA: Diagnosis not present

## 2022-02-23 DIAGNOSIS — M25571 Pain in right ankle and joints of right foot: Secondary | ICD-10-CM | POA: Insufficient documentation

## 2022-02-23 DIAGNOSIS — Z969 Presence of functional implant, unspecified: Secondary | ICD-10-CM | POA: Insufficient documentation

## 2022-02-23 MED ORDER — TRIAMCINOLONE ACETONIDE 40 MG/ML IJ SUSP
60.0000 mg | Freq: Once | INTRAMUSCULAR | Status: AC
  Administered 2022-02-23: 60 mg via INTRAMUSCULAR

## 2022-02-23 MED ORDER — KETOROLAC TROMETHAMINE 60 MG/2ML IM SOLN
60.0000 mg | Freq: Once | INTRAMUSCULAR | Status: AC
  Administered 2022-02-23: 60 mg via INTRAMUSCULAR

## 2022-02-23 MED ORDER — DICLOFENAC SODIUM 1 % EX GEL
4.0000 g | Freq: Four times a day (QID) | CUTANEOUS | 1 refills | Status: DC
  Filled 2022-02-23: qty 100, 6d supply, fill #0

## 2022-02-23 NOTE — Patient Instructions (Addendum)
Kenalog and Toradol 60 mg each injection given in the office Apply Voltaren gel to right ankle as needed Take Ibuprofen as directed for pain, with food    Elastic Bandage and RICE Therapy  Elastic bandages come in different shapes and sizes. They generally provide support to your injury and reduce swelling while you are healing, but they can perform different functions. Your health care provider will help you to decide what is best for your protection, recovery, or rehabilitation after an injury. The routine care of many injuries includes rest, ice, compression, and elevation (RICE therapy). RICE therapy is often recommended for injuries to soft tissues, such as muscle strain, sprains, bruises, and overuse injuries. It can also be used for some bone injuries. Using RICE therapy can help to relieve pain and lessen swelling. What are some general tips for using an elastic bandage? Use the bandage as directed by the maker of the bandage that you are using. Do not wrap the bandage too tightly. This may block (cut off) the circulation in the arm or leg in the area below the bandage. If part of your body beyond the bandage becomes blue, numb, cold, swollen, or more painful, your bandage is probably too tight. If this occurs, remove your bandage and reapply it more loosely. Remove and reapply an elastic bandage every 3-4 hours or as told by your health care provider. See your health care provider if the bandage seems to be making your problems worse rather than better. How to care for your injury with RICE therapy Rest Rest your injury. This may help with the healing process. Rest usually involves limiting your normal activities and not using the injured part of your body. Generally, you can return to your normal activities when your health care provider says it is okay and you can do them without much discomfort. If you rest the injury too much, it may not heal as well. Some injuries heal better with  early movement instead of resting for too long. Talk with your health care provider about how you should limit your activities and whether you should start range-of-motion exercises for your injury. Ice Ice your injury to lessen swelling and pain. Do not apply ice directly to your skin. Put ice in a plastic bag. Place a towel between your skin and the bag. Leave the ice on for 20 minutes, 2-3 times a day. Use ice on as many days as told by your health care provider.  Compression Put pressure (compression) on your injured area to control swelling, give support, and help with discomfort. Compression may be done with an elastic bandage. Elevation Raise (elevate) your injured area to lessen swelling and pain. If possible, elevate your injured area at or above the level of your heart or the center of your chest. Contact a health care provider if: Your pain and swelling continue. Your symptoms are getting worse rather than improving. Having these problems may mean that you need further evaluation or imaging tests, such as X-rays or an MRI. Sometimes, X-rays may not show a small broken bone (fracture) until days after the injury happened. Make a follow-up appointment with your health care provider. Ask your health care provider, or the department that is doing the imaging test, when your results will be ready. Get help right away if: You have sudden severe pain at or below the area of your injury. You have redness or increased swelling around your injury. You have tingling or numbness at or below the area  of your injury and it does not improve after you remove the elastic bandage. Summary Elastic bandages provide support to your injury and reduce swelling while you are healing. Your health care provider will help you decide which type of elastic bandage is best for your injury. Do not wrap the bandage too tightly. This may block (cut off) the circulation in the arm or leg in the area below the  bandage. Putting pressure (compression) on your injured area with an elastic bandage is part of RICE therapy. RICE therapy includes rest, ice, compression, and elevation. This treatment is recommended for the routine care of many injuries. This information is not intended to replace advice given to you by your health care provider. Make sure you discuss any questions you have with your health care provider. Document Revised: 06/07/2019 Document Reviewed: 12/30/2016 Elsevier Patient Education  2021 [employer]   Ankle Pain The ankle joint holds your body weight and allows you to move around. Ankle pain can occur on either side or the back of one ankle or both ankles. Ankle pain may be sharp and burning or dull and aching. There may be tenderness, stiffness, redness, or warmth around the ankle. Many things can cause ankle pain, including an injury to the area and overuse of the ankle. Follow these instructions at home: Activity Rest your ankle as told by your health care provider. Avoid any activities that cause ankle pain. Do not use the injured limb to support your body weight until your health care provider says that you can. Use crutches as told by your health care provider. Do exercises as told by your health care provider. Ask your health care provider when it is safe to drive if you have a brace on your ankle. If you have a brace: Wear the brace as told by your health care provider. Remove it only as told by your health care provider. Loosen the brace if your toes tingle, become numb, or turn cold and blue. Keep the brace clean. If the brace is not waterproof: Do not let it get wet. Cover it with a watertight covering when you take a bath or shower. If you were given an elastic bandage:  Remove it when you take a bath or a shower. Try not to move your ankle very much, but wiggle your toes from time to time. This helps to prevent swelling. Adjust the bandage to make it more  comfortable if it feels too tight. Loosen the bandage if you have numbness or tingling in your foot or if your foot turns cold and blue. Managing pain, stiffness, and swelling  If directed, put ice on the painful area. If you have a removable brace or elastic bandage, remove it as told by your health care provider. Put ice in a plastic bag. Place a towel between your skin and the bag. Leave the ice on for 20 minutes, 2-3 times a day. Move your toes often to avoid stiffness and to lessen swelling. Raise (elevate) your ankle above the level of your heart while you are sitting or lying down. General instructions Record information about your pain. Writing down the following may be helpful for you and your health care provider: How often you have ankle pain. Where the pain is located. What the pain feels like. If treatment involves wearing a prescribed shoe or insole, make sure you wear it correctly and for as long as told by your health care provider. Take over-the-counter and prescription medicines only as told  by your health care provider. Keep all follow-up visits as told by your health care provider. This is important. Contact a health care provider if: Your pain gets worse. Your pain is not relieved with medicines. You have a fever or chills. You are having more trouble with walking. You have new symptoms. Get help right away if: Your foot, leg, toes, or ankle: Tingles or becomes numb. Becomes swollen. Turns pale or blue. Summary Ankle pain can occur on either side or the back of one ankle or both ankles. Ankle pain may be sharp and burning or dull and aching. Rest your ankle as told by your health care provider. If told, apply ice to the area. Take over-the-counter and prescription medicines only as told by your health care provider. This information is not intended to replace advice given to you by your health care provider. Make sure you discuss any questions you have with  your health care provider. Document Revised: 06/02/2020 Document Reviewed: 06/04/2020 Elsevier Patient Education  [place].

## 2022-02-23 NOTE — Progress Notes (Signed)
Acute Office Visit  Subjective:    Patient ID: Paige Hardy, female    DOB: 12-Jan-1966, 56 y.o.   MRN: [id number]  Chief Complaint  Patient presents with   Foot Pain    HPI: Patient is in today for Pain  She reports recurrent right foot and ankle pain and swelling. was not a recent injury that may have caused the pain.She had a right ankle fracture that required ORIF 16 years ago. She has retained hardware. States she can feel screw protruding that is palpable The pain started 2 weeks ago and is staying constant. The pain does not radiate . The pain is described as dull, "feels like it is tearing", is 7/10 in intensity, occurring intermittently.  Aggravating factors: bending backwards, bending forwards, standing, walking, and rotating  Relieving factors: rest . She has tried NSAIDs with mild relief.    Past Medical History:  Diagnosis Date   Essential hypertension    Mixed hyperlipidemia    Vitamin D deficiency     Past Surgical History:  Procedure Laterality Date   CHOLECYSTECTOMY      Family History  Problem Relation Age of Onset   Hypertension Other    Prostate cancer Father    Hypertension Maternal Grandfather    Diabetes Maternal Grandfather     Social History   Socioeconomic History   Marital status: Married    Spouse name: Not on file   Number of children: Not on file   Years of education: Not on file   Highest education level: Not on file  Occupational History   Not on file  Tobacco Use   Smoking status: Never   Smokeless tobacco: Never  Substance and Sexual Activity   Alcohol use: Yes    Comment: soically   Drug use: Never   Sexual activity: Not on file  Other Topics Concern   Not on file  Social History Narrative   Not on file   Social Determinants of Health   Financial Resource Strain: Not on file  Food Insecurity: Not on file  Transportation Needs: Not on file  Physical Activity: Not on file  Stress: Not on file  Social Connections:  Not on file  Intimate Partner Violence: Not on file    Outpatient Medications Prior to Visit  Medication Sig Dispense Refill   Ascorbic Acid (VITAMIN C) 1000 MG tablet Take 1,000 mg by mouth daily.     atorvastatin (LIPITOR) 20 MG tablet TAKE 1 TABLET BY MOUTH ONCE DAILY 90 tablet 0   losartan (COZAAR) 50 MG tablet Take 1 tablet by mouth daily. 90 tablet 0   Multiple Vitamin (MULTIVITAMIN) tablet Take 1 tablet by mouth daily.     valACYclovir (VALTREX) 500 MG tablet Take 1 tablet (500 mg total) by mouth 2 (two) times daily. 20 tablet 2   amoxicillin (AMOXIL) 875 MG tablet Take 1 tablet (875 mg total) by mouth 2 (two) times daily. 20 tablet 0   No facility-administered medications prior to visit.    No Known Allergies  Review of Systems See pertinent positives and negatives per HPI.     Objective:    Physical Exam Vitals reviewed.  Constitutional:      Appearance: Normal appearance.  Cardiovascular:     Pulses:          Dorsalis pedis pulses are 2+ on the right side.       Posterior tibial pulses are 2+ on the right side.  Musculoskeletal:  General: Swelling and tenderness present.     Right lower leg: Edema (ankle) present.     Comments: Right foot and ankle swelling  Skin:    Capillary Refill: Capillary refill takes less than 2 seconds.  Neurological:     Mental Status: She is alert.    BP 130/80   Pulse 86   Temp (!) 97.3 F (36.3 C)   Ht _0  (1.651 m)   Wt 205 lb (93 kg)   SpO2 100%   BMI 34.11 kg/m   Wt Readings from Last 3 Encounters:  02/23/22 205 lb (93 kg)  03/17/21 197 lb (89.4 kg)  01/15/21 194 lb (88 kg)    Health Maintenance Due  Topic Date Due   HIV Screening  Never done   Hepatitis C Screening  Never done   Zoster Vaccines- Shingrix (1 of 2) Never done   COVID-19 Vaccine (3 - Pfizer series) 02/21/2020   INFLUENZA VACCINE  11/23/2021       Lab Results  Component Value Date   TSH 2.790 05/25/2020   Lab Results  Component  Value Date   WBC 7.4 12/07/2020   HGB 13.0 12/07/2020   HCT 41.3 12/07/2020   MCV 87 12/07/2020   PLT 239 12/07/2020   Lab Results  Component Value Date   NA 142 12/07/2020   K 4.2 12/07/2020   CO2 24 12/07/2020   GLUCOSE 103 (H) 12/07/2020   BUN 11 12/07/2020   CREATININE 1.06 (H) 12/07/2020   BILITOT 0.4 12/07/2020   ALKPHOS 95 12/07/2020   AST 22 12/07/2020   ALT 20 12/07/2020   PROT 7.3 12/07/2020   ALBUMIN 4.6 12/07/2020   CALCIUM 9.5 12/07/2020   EGFR 63 12/07/2020   Lab Results  Component Value Date   CHOL 159 12/07/2020   Lab Results  Component Value Date   HDL 52 12/07/2020   Lab Results  Component Value Date   LDLCALC 93 12/07/2020   Lab Results  Component Value Date   TRIG 75 12/07/2020   Lab Results  Component Value Date   CHOLHDL 3.1 12/07/2020   Lab Results  Component Value Date   HGBA1C 5.4 05/25/2020       Assessment & Plan:   1. Acute right ankle pain - DG Ankle Complete Right; Future - ketorolac (TORADOL) injection 60 mg - triamcinolone acetonide (KENALOG-40) injection 60 mg - diclofenac Sodium (VOLTAREN) 1 % GEL; Apply 4 grams topically 4 (four) times daily.  Dispense: 50 g; Refill: 1  2. History of ankle fracture - DG Ankle Complete Right; Future  3. Presence of retained hardware - DG Ankle Complete Right; Future   Kenalog and Toradol 60 mg each injection given in the office Apply Voltaren gel to right ankle as needed Take Ibuprofen as directed for pain, with food     Follow-up: PRN  An After Visit Summary was printed and given to the patient.  I, Rip Harbour, NP, have reviewed all documentation for this visit. The documentation on 02/23/22 for the exam, diagnosis, procedures, and orders are all accurate and complete.   Signed, Rip Harbour, NP [place] [phone]

## 2022-02-24 ENCOUNTER — Encounter (HOSPITAL_COMMUNITY): Payer: Self-pay

## 2022-02-24 ENCOUNTER — Other Ambulatory Visit (HOSPITAL_COMMUNITY): Payer: Self-pay

## 2022-02-25 ENCOUNTER — Encounter: Payer: Self-pay | Admitting: Nurse Practitioner

## 2022-02-28 ENCOUNTER — Encounter: Payer: Self-pay | Admitting: Nurse Practitioner

## 2022-02-28 ENCOUNTER — Other Ambulatory Visit: Payer: Self-pay | Admitting: Nurse Practitioner

## 2022-02-28 DIAGNOSIS — Z969 Presence of functional implant, unspecified: Secondary | ICD-10-CM

## 2022-02-28 DIAGNOSIS — M25571 Pain in right ankle and joints of right foot: Secondary | ICD-10-CM

## 2022-02-28 DIAGNOSIS — T84223D Displacement of internal fixation device of bones of foot and toes, subsequent encounter: Secondary | ICD-10-CM

## 2022-03-04 ENCOUNTER — Other Ambulatory Visit (HOSPITAL_COMMUNITY): Payer: Self-pay

## 2022-03-04 ENCOUNTER — Other Ambulatory Visit: Payer: Self-pay | Admitting: Family Medicine

## 2022-03-04 ENCOUNTER — Encounter (HOSPITAL_COMMUNITY): Payer: Self-pay

## 2022-03-05 MED ORDER — ATORVASTATIN CALCIUM 20 MG PO TABS
20.0000 mg | ORAL_TABLET | Freq: Every day | ORAL | 0 refills | Status: DC
  Filled 2022-03-05: qty 90, 90d supply, fill #0

## 2022-03-05 MED ORDER — LOSARTAN POTASSIUM 50 MG PO TABS
50.0000 mg | ORAL_TABLET | Freq: Every day | ORAL | 0 refills | Status: DC
  Filled 2022-03-05: qty 90, 90d supply, fill #0

## 2022-03-07 ENCOUNTER — Other Ambulatory Visit (HOSPITAL_COMMUNITY): Payer: Self-pay

## 2022-03-08 ENCOUNTER — Ambulatory Visit (INDEPENDENT_AMBULATORY_CARE_PROVIDER_SITE_OTHER): Payer: 59 | Admitting: Orthopedic Surgery

## 2022-03-08 DIAGNOSIS — T85848A Pain due to other internal prosthetic devices, implants and grafts, initial encounter: Secondary | ICD-10-CM | POA: Diagnosis not present

## 2022-03-08 DIAGNOSIS — M19171 Post-traumatic osteoarthritis, right ankle and foot: Secondary | ICD-10-CM

## 2022-03-10 ENCOUNTER — Encounter: Payer: Self-pay | Admitting: Orthopedic Surgery

## 2022-03-10 NOTE — Progress Notes (Signed)
Office Visit Note   Patient: Paige Hardy           Date of Birth: Oct 14, 1965           MRN: [id number] Visit Date: 03/08/2022              Requested by: Janie Morning, NP [address] [place],  [place] [address] PCP: Blane Ohara, MD  Chief Complaint  Patient presents with   Right Ankle - Pain      HPI: Patient is a 56 year old woman who is seen for initial evaluation for painful retained hardware right ankle laterally as well as painful traumatic arthritis in the right ankle.  Patient states she had surgery about 16 years ago from a fall off a horse.  Patient was treated near [facility].  She states Dr. Meredeth Ide.  Patient complains of pain with activities of daily living.  Assessment & Plan: Visit Diagnoses:  1. Post-traumatic arthritis of right ankle   2. Pain from implanted hardware, initial encounter     Plan: Discussed initial treatment options would be to remove the prominent hardware lateral malleolus either 1 screw or the screw and all hardware and ankle arthroscopy for the traumatic arthritis.  Risk and benefits were discussed including persistent pain need for additional surgery.  Patient states she understands and wishes to proceed with surgery.  Follow-Up Instructions: Return in about 2 weeks (around 03/22/2022).   Ortho Exam  Patient is alert, oriented, no adenopathy, well-dressed, normal affect, normal respiratory effort. Examination patient has a good dorsalis pedis pulse she has pain to palpation over the anterior joint line.  The screws from the fibular plate have backed out and there is a prominent painful screw and painful hardware.  Patient's radiographs show traumatic arthritis of the tibial talar joint and prominent hardware.  There are no open wounds.  Imaging: No results found. No images are attached to the encounter.  Labs: Lab Results  Component Value Date   HGBA1C 5.4 05/25/2020     Lab Results  Component Value  Date   ALBUMIN 4.6 12/07/2020   ALBUMIN 4.3 08/24/2020   ALBUMIN 4.5 05/25/2020    No results found for: "MG" Lab Results  Component Value Date   VD25OH 46.3 08/14/2019    No results found for: "PREALBUMIN"    Latest Ref Rng & Units 12/07/2020    8:10 AM 08/24/2020    8:08 AM 05/25/2020    8:32 AM  CBC EXTENDED  WBC 3.4 - 10.8 x10E3/uL 7.4  8.4  7.8   RBC 3.77 - 5.28 x10E6/uL 4.76  4.60  4.90   Hemoglobin 11.1 - 15.9 g/dL 19.1  47.8  29.5   HCT 34.0 - 46.6 % 41.3  39.4  42.4   Platelets 150 - 450 x10E3/uL 239  272  224   NEUT# 1.4 - 7.0 x10E3/uL 4.5  5.7  4.7   Lymph# 0.7 - 3.1 x10E3/uL 2.2  2.0  2.5      There is no height or weight on file to calculate BMI.  Orders:  No orders of the defined types were placed in this encounter.  No orders of the defined types were placed in this encounter.    Procedures: No procedures performed  Clinical Data: No additional findings.  ROS:  All other systems negative, except as noted in the HPI. Review of Systems  Objective: Vital Signs: There were no vitals taken for this visit.  Specialty Comments:  No specialty comments available.  PMFS History: Patient Active Problem List   Diagnosis Date Noted   Acute infection of nasal sinus 03/17/2021   Colon cancer screening 11/20/2019   Class 1 obesity due to excess calories with serious comorbidity and body mass index (BMI) of 30.0 to 30.9 in adult 08/20/2019   Essential hypertension    Mixed hyperlipidemia    Vitamin D deficiency    Past Medical History:  Diagnosis Date   Essential hypertension    Mixed hyperlipidemia    Vitamin D deficiency     Family History  Problem Relation Age of Onset   Hypertension Other    Prostate cancer Father    Hypertension Maternal Grandfather    Diabetes Maternal Grandfather     Past Surgical History:  Procedure Laterality Date   CHOLECYSTECTOMY     Social History   Occupational History   Not on file  Tobacco Use   Smoking  status: Never   Smokeless tobacco: Never  Substance and Sexual Activity   Alcohol use: Yes    Comment: soically   Drug use: Never   Sexual activity: Not on file

## 2022-03-15 ENCOUNTER — Telehealth: Payer: Self-pay

## 2022-03-15 ENCOUNTER — Other Ambulatory Visit: Payer: Self-pay

## 2022-03-15 ENCOUNTER — Telehealth: Payer: Self-pay | Admitting: Rheumatology

## 2022-03-15 ENCOUNTER — Encounter: Payer: Self-pay | Admitting: Rheumatology

## 2022-03-15 ENCOUNTER — Other Ambulatory Visit
Admission: RE | Admit: 2022-03-15 | Discharge: 2022-03-15 | Disposition: A | Discharge status: Home / Self Care | Payer: Medicaid Other | Source: Ambulatory Visit | Attending: Student in an Organized Health Care Education/Training Program | Admitting: Student in an Organized Health Care Education/Training Program

## 2022-03-15 ENCOUNTER — Ambulatory Visit: Payer: Medicaid Other | Admitting: Student in an Organized Health Care Education/Training Program

## 2022-03-15 ENCOUNTER — Ambulatory Visit: Payer: Medicaid Other | Admitting: Rheumatology

## 2022-03-15 VITALS — BP 153/73 | Wt 257.0 lb

## 2022-03-15 DIAGNOSIS — Z5181 Encounter for therapeutic drug level monitoring: Secondary | ICD-10-CM

## 2022-03-15 DIAGNOSIS — L409 Psoriasis, unspecified: Secondary | ICD-10-CM

## 2022-03-15 DIAGNOSIS — L4050 Arthropathic psoriasis, unspecified: Secondary | ICD-10-CM

## 2022-03-15 DIAGNOSIS — M2550 Pain in unspecified joint: Secondary | ICD-10-CM

## 2022-03-15 NOTE — Telephone Encounter (Signed)
Prior authorization for biologic agents     Please initiate prior authorization for Stelara  This is a: New prescription; first biologic  Details of dose or dose form or dose route: SUBQ: 90 mg at 0 and 4 weeks, and then 90 mg every 12 weeks thereafter    Treatment diagnosis:   1. Psoriatic arthritis        Relevant history:   Concurrent medications for this condition: None  Prior biologics:  None   Other relevant prior medications: Topical steroids, voltaren, MTX contraindicated    TB screening if relevant:  Monitoring for use of biologics: @RHUTBSCREEN @           TB Ag results:   Lab Results   Component Value Date    2GBT NEGATIVE 01/05/2022     Other testing:     A consent form has been signed by the patient:  No and N/A    Risks and benefits of using this medication have been discussed with the patient, including any specific risks related to the use of biologics or infusion medications.  She has had a chance to ask questions which were answered to her satisfaction, and she has verbally consented to the use of this medication.      Costella Hatcher, MD   1:52 PM   03/15/2022

## 2022-03-15 NOTE — Progress Notes (Addendum)
Dermatology Progress Note  No chief complaint on file.    Derm History:   - previously followed with Dr. Neva Seat  Psoriasis: for ~22 years   - prior tx: ILK, clobetasol with some improvement   - insurance denied taclonex  - 01/05/2022: current tx calcipotriene, betamethasone,     Relevant Medical History:  - history of diverticulitis, but no personal or family hx of IBD  ?  HPI:   Loretta [name] is a pleasant 56 y.o. female here for the concern noted below.  ?  Last Visit (01/05/22): Imaging of hands, knees, and hips ordered per Rheum for PSA workup.     Today 01/05/2022:   - Worsening of psoriasis on the posterior neck and BLE   - Neck feels raw and hurts   - Using calcipotriene + betamethasone BID   - Right foot, b/l ankles, achilles, R>L knee, low back, fingers are all painful    ?  Family Hx: son has psoriasis  Social Hx:  Works as a Surveyor, mining.     Physical Exam:   Vitals:    03/15/22 1024   BP: 153/73   Weight: 116.6 kg (257 lb)     Skin: All of the following were examined, and were within normal limits, except as noted: Face, Scalp/Hair, Extremities (RUE/LUE) and Extremities (RLE/LLE)  -Well demarcated, red, scaly plaques on posterior neck b/l shins, left anterior ankle, left calf, and elbows worse than previous visit          Labs:       Basic Metabolic Panel CBC      No results for input(s): "NA", "K", "CL", "CO2", "UN", "CREAT", "GFRC", "GFRB", "GLU", "CA", "TP", "ALB", "ALT", "AST", "ALK", "TB" in the last 8760 hours.        No results for input(s): "WBC", "HGB", "HCT", "RBC", "PLT", "ASEGR", "ALYMR", "AMONR", "AEOSR", "ABASR", "SEGR", "LYMPR", "MONOR", "EOSR", "BASOR", "BAND", "MYELO", "META", "PROMY", "BLAST" in the last 8760 hours.        Liver Panel Other Labs    No results for input(s): "TP", "ALB", "ALT", "AST", "ALK", "TB", "DB", "GGT" in the last 8760 hours.        No results found for: "HA1C"  No results found for: "TSH", "T3", "T4", "THPER"        No results found for: "FE", "IBC",  "FESAT"  No results for input(s): "VIDD2", "VIDD3", "VID25" in the last 8760 hours.     No results for input(s): "CHOL", "HDL", "LDLC", "LDL", "TRIG", "CHHDC" in the last 8760 hours.    No components found with this basename: "NHLDC"  Lab Results   Component Value Date    ANAS NEG 01/05/2022    RFD <8 01/05/2022    ESR 32 (H) 01/05/2022          Micro results        No results found for: "HAAB", "HAIGM", "HCOR", "HBEAG", "HHCV"     No results found for: "HV12T", "FHV12"     Latest Reference Range & Units 01/05/22 13:52   TB AG T-Cell Stim IU/mL NEGATIVE                      Hand bilateral single view  01/12/2022 11:48 AM    BILATERAL HAND X-RAY    CLINICAL INFORMATION:  joint pain, L40.9-Psoriasis, unspecified.    COMPARISON:  None.    PROCEDURE:  A single projection of the bilateral hands was obtained.    IMPRESSION/FINDINGS:  The bones disclose adequate mineralization and trabecular pattern. No joint space loss, erosion, or periostitis noted.    END OF IMPRESSION              [facility] submits this DICOM format image data and final report to the [facility], an independent secure electronic health information exchange, on a reciprocally searchable basis (with patient authorization) for a minimum of 12 months after exam   date.  Knee bilateral  AP standing  Narrative: 01/12/2022 11:49 AM    BILATERAL STANDING KNEE X-RAY    CLINICAL INFORMATION:  joint pain, L40.9-Psoriasis, unspecified.    COMPARISON: 05/08/2019.    PROCEDURE: AP projection of bilateral standing knee were obtained.    FINDINGS/  Impression: Moderate narrowing of the right medial compartment with mild subchondral sclerosis. The joint spaces on the left appear age-appropriate.    END OF IMPRESSION              [facility] submits this DICOM format image data and final report to the [facility], an independent secure electronic health information exchange, on a reciprocally searchable basis (with patient authorization) for a minimum of 12  months after exam   date.  Hips bilateral AP pelvis and frog leg lateral views  Narrative: 01/12/2022 11:49 AM    BILATERAL HIP X-RAYS    CLINICAL INFORMATION:  joint pain, L40.9-Psoriasis, unspecified.    COMPARISON: 11/03/2019    PROCEDURE: AP projection of the pelvis and 2 projections of the right hip and 2 projections of the left hip were obtained.    FINDINGS/  Impression: The hip joints appear age appropriate and unchanged from prior.    END OF IMPRESSION              [facility] submits this DICOM format image data and final report to the [facility], an independent secure electronic health information exchange, on a reciprocally searchable basis (with patient authorization) for a minimum of 12 months after exam   date.      ??    Assessment/Plan:    Psoriasis with PSA: psoriasis with 5% BSA coverage, chronic, not at treatment goal, flaring today  PLAN:   - Patient to start Ustekinumab (Stelara: IL-23 Inhibitor) per Rheumatology     - TB-Quant: Negative     - Hepatitis Panel: Ordered today (copied to Dr.Kruzer)  - Continue calcipotriene 0.005% and betamethasone dipropionate 0.064% ointments twice daily as needed to the itchy, red, and scaly affected areas on the body   - Apply ointment, then cover with saran wrap at night   -During the day, you can cover with tegaderm dressings   - Do not use on face, underarms, or groin   - Side effects: lightening or thinning of the skin, especially if used on areas where you don't have rash         RTC: 29mo for PSA     Truitt Leep, MD  PGY-3, Dermatology Resident   03/15/22 10:28 AM      Patient was discussed/examined with Dr.Moynihan

## 2022-03-15 NOTE — Telephone Encounter (Signed)
Loretta [name]

## 2022-03-15 NOTE — Patient Instructions (Addendum)
-   We will put in a prior authorization for stelara for psoriatic arthritis  - Get update blood work  - Return to clinic in 3 months

## 2022-03-15 NOTE — Progress Notes (Addendum)
Rheumatology Follow-Up Note     PRIMARY CARE PHYSICIAN: Gaspar Skeeters, DO    RHEUMATOLOGY SNAPSHOT: No specialty comments available.        Subjective   HPI:   Loretta [name] is a 56 y.o. female with PMH diverticulitis, seasonal allergic rhinitis, PsA who is here for follow up of PsA.    She was last seen in PsA clinic on 9/13 as a NPV. Today overall she feels she is having a good day, but still rates the pain as 6/10. She has joint pain in her hands primarily, but also in her knee and shoulders. She has a history of diverticulitis but no family history of IBD. Does have a son with psoriasis. Has experienced joint pain x2 years, but more recently has worsening pain in her hands. At her last clinic visit workup was initiated for possible PsA, RA, vs reactive arthritis. She is not currently on any treatment. 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    As for her psoriasis, she has hx of psoriasis for 22 years. Previously with 5% coverage of body. She has plaques on her scalp, neck, and two on her lower legs today. Previously she was treated with ILK,clobetasol with some improvement.  Taking betamethasone and calcipotriene ointment. The psoriasis on her neck is primarily her concern today.                  Current Meds:     Current Outpatient Medications   Medication Sig    betamethasone dipropionate (DIPROSONE) 0.05 % ointment Apply topically 2 times daily as needed for Rash  to the following areas: red, scaly areas on the body. Do not use on face, underarms, groin.    calcipotriene (DOVONOX) 0.005 % ointment Apply topically 2 times daily  Apply to red scaly areas on the body two times daily.    traZODone (DESYREL) 50 MG tablet Take 100 mg by mouth nightly as needed                 The history including problem list, medication list, allergies, and medical, family, and social history were reviewed and updated as needed during this visit.  Please see extended visit documentation for details.             REVIEW of SYSTEMS:       Constitutional: No diaphoresis, no decreased appetite, no weight loss and no fatigue.    Musculoskeletal: Joint pain, joint stiffness and morning stiffness. No joint swelling. lasting about 1-2 hours.    Skin: Rash. No nail changes.    Eyes: Negative. No eye redness, no pain and no eye dryness.    ENT: Negative. No dry mouth and no mouth sores.    Respiratory: Negative. No shortness of breath.    Cardiac: Negative. No chest pain and no palpitations.    Gastrointestinal: No difficulty swallowing, no nausea, no vomiting, no diarrhea and no constipation.    Genitourinary: Negative.    Endocrine: Negative.    Allergic/Immunologic: Negative.   Hematologic: Negative.   Neurologic: Negative.    Psychiatric: Negative.            Objective    PHYSICAL EXAMINATION:  There were no vitals taken for this visit.  There is no height or weight on file to calculate BMI.  Pain    03/15/22 1020   PainSc:   7   PainLoc: Neck     Constitutional:    Well-developed  and well-nourished.    Eyes:    Pupils are equal, round, and reactive and EOM are normal.     HENT:   Normocephalic and atraumatic.      There are no oral lesions and the salivary pool is normal.    Neck:    Normal range of motion and neck supple.    Lymph:   No adenopathy.    Cardiovascular:    Normal rate and regular rhythm.    Pulmonary:    Effort is normal.    Abdominal:    The abdomen is soft.     There is no abdominal tenderness.   Skin:    Skin is warm and dry.     She has a rash. She has normal nails.     Plaques on neck, and legs bilaterally.   Extremities:    There is no edema of the extremities.   Neuro:    She is alert and is oriented.     Musculoskeletal Exam:   Upper and lower extremities  There is peripheral joint tenderness. There is no peripheral joint swelling, no peripheral joint deformity and no abnormal or restricted ROM.    Axial exam  There is no spinal tenderness.   MSK Comments  Tenderness in PIPs and dorsal foot          Jump to joint exam  Joint Exam 03/15/2022        Right  Left   PIP 2   Tender      PIP 3   Tender   Tender   PIP 4   Tender   Tender   PIP 5      Tender   Ankle   Tender      Subtalar   Tender      Tarsometatarsal  Swollen Tender   Tender                 PRIOR STUDIES:  Labs:     - Recent labs reviewed -- please see ERecord results review section for values.  -  DMARD monitoring labs        Lab results: 01/05/22  1352   Sedimentation Rate 32*   CRP 6      - Inflammatory markers etc -       Lab results: 01/05/22  1352   Sedimentation Rate 32*   CRP 6     - Rheum serologies        Lab results: 01/05/22  1352   ANA Screen NEG   Rheumatoid Factor <8   Cyclic Citrullin Peptide Ab <5.4        Radiology:       -Recent imaging reports reviewed -- please see ERecord imaging section for details.  - Image results: No results found.      Assessment    IMPRESSION:       ICD-10-CM ICD-9-CM    1. Psoriatic arthritis  L40.50 696.0       2. Arthralgia, unspecified joint  M25.50 719.40       3. Psoriasis  L40.9 696.1           Sneha C Hoye is a 56 y.o. female with PMH diverticulitis, seasonal allergic rhinitis, PsA who is here for follow up of PsA. She had baseline imaging and labs completed at last clinic visit, and symptoms remain concerning for PsA. She has ongoing inflammatory joint pain in her PIPs and right ankle. She has psoriatic plaques on neck and  shins bilaterally. We discussed numerous treatment options, and will proceed with PA for stelara, which can be beneficial for skin and joint manifestations. Her son is also on sterlara and is responding well to the treatment. Plan to update lab work today.    # PsA  - Negative ANA, RF, CCP  - ESR 32, CRP 6  - MSK [place] right hand/wrist/forefoot Esmond Harps 12/2021: No inflammatory changes  - Xray hips 12/2021:: no erosion or periostitis  - Xray feet: Joint spaces maintained, small plantar and retrocalcaneal spur  - Plan: PA for stelara, update labs      RECOMMENDATIONS:       Patient Instructions   - We will put in a prior authorization for stelara for psoriatic arthritis  - Get update blood work  - Return to clinic in 3 months      Flavia Shipper, MD  Rheumatology Fellow PGY-5    AIR Attending    I saw and evaluated the patient. I agree with Dr. Timmothy Sours findings and plan of care as documented. 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 We discussed various treatment options and decided on stelara. Her son, who also has PsA, responded well to this medication. We will proceed with prior authorization.     Marcelline Deist, MD MPH

## 2022-03-16 LAB — HEPATITIS A,B,C,PANEL
HBV Core Ab: NEGATIVE
HBV S Ab Quant: 0.42 m[IU]/mL
HBV S Ab: NEGATIVE
HBV S Ag: NEGATIVE
Hep C Ab: NEGATIVE
Hepatitis A IGG: NEGATIVE

## 2022-03-28 ENCOUNTER — Telehealth: Payer: Self-pay | Admitting: Rheumatology

## 2022-03-28 DIAGNOSIS — L4050 Arthropathic psoriasis, unspecified: Secondary | ICD-10-CM

## 2022-03-28 NOTE — Telephone Encounter (Signed)
Prior authorization for biologic agents     Please initiate prior authorization for Humira  This is a: New prescription; first biologic  Details of dose or dose form or dose route: 40 mg SQ citrate free every other week    Treatment diagnosis:   1. Psoriatic arthritis        Relevant history:   Concurrent medications for this condition: None  Prior biologics:  None  Other relevant prior medications: Topical steroids, voltaren, MTX contraindicated     TB screening if relevant:  Monitoring for use of biologics: @RHUTBSCREEN @           TB Ag results:   Lab Results   Component Value Date    2GBT NEGATIVE 01/05/2022     Other testing:     A consent form has been signed by the patient:  N/A    Risks and benefits of using this medication have been discussed with the patient, including any specific risks related to the use of biologics or infusion medications.  She has had a chance to ask questions which were answered to her satisfaction, and she has verbally consented to the use of this medication.      Costella Hatcher, MD   8:54 PM   03/28/2022

## 2022-03-30 ENCOUNTER — Other Ambulatory Visit: Payer: Self-pay

## 2022-03-30 ENCOUNTER — Other Ambulatory Visit: Payer: Self-pay | Admitting: Rheumatology

## 2022-03-30 ENCOUNTER — Other Ambulatory Visit: Payer: Self-pay | Admitting: Pharmacist

## 2022-03-30 ENCOUNTER — Encounter: Payer: Self-pay | Admitting: Rheumatology

## 2022-03-30 DIAGNOSIS — L4050 Arthropathic psoriasis, unspecified: Secondary | ICD-10-CM

## 2022-03-30 MED ORDER — ADALIMUMAB 40 MG/0.4ML SC AJKT *I*
40.0000 mg | AUTO-INJECTOR | SUBCUTANEOUS | 1 refills | Status: DC
Start: 2022-03-30 — End: 2022-06-07
  Filled 2022-03-30 (×2): qty 2, 28d supply, fill #0
  Filled 2022-04-21: qty 2, 28d supply, fill #1
  Filled 2022-05-20 – 2022-05-23 (×2): qty 2, 28d supply, fill #2

## 2022-03-30 NOTE — Progress Notes (Signed)
Spoke with Loretta [name] regarding new prescription for Adalimumab Pen for PsA.    Initial assessment included review of interdisciplinary team documentation on physical health, social, environmental, economic, functional and mental components relevant to the patient's ability to adhere to the care plan with [facility] [employer].    Briefly reviewed administration, treatment goals, duration of therapy, adherence strategies, side effects, safety precautions, contraindications, missed dose management, handling, storage and disposal, with plan to review medication and administration in detail at teach appointment. Patient requested to trial pen device if possible to start.     Baseline testing: Tb Completed on 01/06/12 as Negative, HBV and HCV negative 03/15/22  Lab Results   Component Value Date    SAB NEG 03/15/2022    CORZ NEG 03/15/2022    HCV NEG 03/15/2022     Administration instructions: Reviewed dosing of 1 injection (40 mg) every 2 weeks and adherence techniques.  Pharmacy will provide print instructions and dosing calendar with medication as well for reference.   Planned Start Date: 04/04/22  Adherence plan: To encourage use of calendar or reminder system to track dosing.   Potential side effects discussed: Injection site reactions and immune suppression.   Specialty Pharmacy: [facility]; Delivery Date: 03/31/22; Copay: $1 waived    Medication list reviewed; list is up to date. No significant interactions identified.  Current Outpatient Medications   Medication Sig    adalimumab (HUMIRA) 40 mg/0.4 mL injection pen Inject 0.4 mLs (40 mg total) into the skin every 14 days    betamethasone dipropionate (DIPROSONE) 0.05 % ointment Apply topically 2 times daily as needed for Rash  to the following areas: red, scaly areas on the body. Do not use on face, underarms, groin.    calcipotriene (DOVONOX) 0.005 % ointment Apply topically 2 times daily  Apply to red scaly areas on the body  two times daily.    traZODone (DESYREL) 50 MG tablet Take 100 mg by mouth nightly as needed     Humira will be delivered to [facility] with teach appointment scheduled for Monday 04/04/22 at 10:15am. Encouraged her to call with any questions or concerns.    Thank you for allowing me to participate in the care of this patient. Please contact me with any questions.    Thank you,  Mack Guise, PharmD  [facility]  Phone: [phone]  Fax: [phone]

## 2022-03-30 NOTE — Telephone Encounter (Signed)
Messaged on mychart.

## 2022-03-30 NOTE — Telephone Encounter (Signed)
Humira script sent to [facility] specialty pharmacy. Will update patient via mychart about humira approval since initial plan was for stelara, which was not approved without trying other treatment options first.    Flavia Shipper, MD  Rheumatology Fellow PGY-5

## 2022-03-30 NOTE — Patient Instructions (Signed)
December 2023      Sunday Monday Tuesday Wednesday Thursday Friday Saturday                            1     2       3     4     5     6     7     8     9       10     11 Humira     12     13     14     15     16       17     18     19     20     21     22     23       24     25 Humira   26     27  [facility] will call for next refill 28     29     30       31 

## 2022-03-31 ENCOUNTER — Other Ambulatory Visit: Payer: Self-pay

## 2022-04-04 ENCOUNTER — Other Ambulatory Visit: Payer: Self-pay

## 2022-04-04 ENCOUNTER — Ambulatory Visit: Payer: Medicaid Other | Admitting: Pharmacist

## 2022-04-04 NOTE — Progress Notes (Signed)
Patient here today regarding medication injection teaching for Humira Pen for PsA.     Initial assessment included review of interdisciplinary team documentation on physical health, social, environmental, economic, functional and mental components relevant to the patient's ability to adhere to the care plan with [facility] [employer].    Baseline labs reviewed: Tb negative 01/05/22  Lab Results   Component Value Date    SAB NEG 03/15/2022    CORZ NEG 03/15/2022    HCV NEG 03/15/2022     Administration, treatment goals, adherence strategies, side effects, handling missed doses, and storage were reviewed with the patient. Medication education and administration instructions included:   Rationale for use of the medication and treatment goals.  Storage of the medication in a refrigerator; do not freeze.  Medication dosing and how to handle missed doses. Adherence strategies were also reviewed.   Possible side effects including injection site reactions and immune suppression. Reviewed signs and symptoms of infection, and to call the office with any concerns of possible infection.   Discussed rare, but possible severe allergic reaction and to seek immediate medical attention should she should experience this.  To check the expiration date and color of fluid prior to administration.  Possible injection sites to use and administration technique.  Pen disposal; patient was provided/has a sharps disposal container; counseled on proper disposal when full.    First injection: Observed on 04/04/2022 into the left abdomen (Lot #[id number], exp. date 08/2023). She tolerated the injection well; the injection site looked normal upon leaving.    Adherence plan: Encouraged use of calendar or electronic reminders to track dosing.   Immunizations: Discussed importance of avoiding live vaccines, however inactive vaccines safe to receive while on this medication. Encouraged her to consider annual influenza vaccine, COVID  booster, as well as pneumonia and varicella vaccines.     Medication list reviewed; list is up to date. No significant drug interactions identified.  Outpatient Encounter Medications as of 04/04/2022   Medication Sig Dispense Refill    adalimumab (HUMIRA) 40 mg/0.4 mL injection pen Inject 0.4 mLs (40 mg total) into the skin every 14 days 3 each 1    betamethasone dipropionate (DIPROSONE) 0.05 % ointment Apply topically 2 times daily as needed for Rash  to the following areas: red, scaly areas on the body. Do not use on face, underarms, groin. 50 g 2    calcipotriene (DOVONOX) 0.005 % ointment Apply topically 2 times daily  Apply to red scaly areas on the body two times daily. 60 g 2    traZODone (DESYREL) 50 MG tablet Take 100 mg by mouth nightly as needed       No facility-administered encounter medications on file as of 04/04/2022.     Recommendations:  Continue Humira 40 mg every 2 weeks with next dose due on 12/22-12/25/23 and every 2 weeks thereafter. Written instructions and dosing calendar given.  Plan to follow up with patient in 3 weeks, however encouraged her to call with any questions or concerns.     Thank you for allowing me to participate in the care of this patient. Please contact me with any questions.     Mack Guise, PharmD  [facility]  Phone: [phone]  Fax: [phone]

## 2022-04-06 ENCOUNTER — Ambulatory Visit: Payer: Medicaid Other | Admitting: Dermatology

## 2022-04-06 ENCOUNTER — Ambulatory Visit: Payer: Medicaid Other | Admitting: Rheumatology

## 2022-04-13 ENCOUNTER — Other Ambulatory Visit: Payer: Self-pay

## 2022-04-13 ENCOUNTER — Encounter (HOSPITAL_COMMUNITY): Payer: Self-pay | Admitting: Orthopedic Surgery

## 2022-04-13 NOTE — Progress Notes (Signed)
PCP - Dr. Sedalia Muta  Cardiologist - Denies  EP- Denies  Endocrine- Denies  Pulm- Denies  Chest x-ray - Denies  EKG - 04/15/22- Day of surgery  Stress Test - Denies  ECHO - Denies  Cardiac Cath - Denies  AICD-na PM-na LOOP-na  Nerve Stimulator- Denies  Dialysis- Denies  Sleep Study - Denies CPAP - Denies  LABS- 04/15/22: CBC, BMP, UPT  ASA- Denies  ERAS- Yes- clears until 0430  HA1C- Denies  Anesthesia- No  Pt denies having chest pain, sob, or fever during the pre-op phone call. All instructions explained to the pt, with a verbal understanding of the material. Pt also instructed to wear a mask and social distance if she goes out. The opportunity to ask questions was provided.

## 2022-04-13 NOTE — Progress Notes (Signed)
S.D.W- Instructions   Your procedure is scheduled on Fri., Dec. 22, 2023 from 7:15AM-8:02AM.  Report to [facility] Main Entrance "A" at 5:30 A.M., then check in with the Admitting office.  Call this number if you have problems the morning of surgery:  [phone]             If you experience any cold or flu symptoms such as cough, fever, chills, shortness of breath, etc. between now and your scheduled surgery, please notify us at the above         number.  Masks are now required throughout our facilities due to the increasing cases of Covid, Flu, and RSV infections.   Remember:  Do not eat after midnight on Dec. 21st  You may drink clear liquids until 3 hours (4:30AM) prior to surgery time, the morning of your surgery.   Clear liquids allowed are: Water, Non-Citrus Juices (without pulp), Carbonated Beverages, Clear Tea, Black Coffee ONLY (NO MILK, CREAM OR POWDERED CREAMER of any kind), and Gatorade    Take these medicines the morning of surgery with A SIP OF WATER: Atorvastatin (LIPITOR)  ValACYclovir (VALTREX)   As of today, STOP taking any Aspirin (unless otherwise instructed by your surgeon) Aleve, Naproxen, Ibuprofen, Motrin, Advil, Goody's, BC's, all herbal medications, fish oil, and all vitamins.          Do not wear jewelry or makeup. Do not wear lotions, powders, perfumes or deodorant. Do not shave 48 hours prior to surgery.   Do not bring valuables to the hospital. Do not wear nail polish, gel polish, artificial nails, or any other type of covering on natural nails (fingers and toes) If you have artificial nails or gel coating that need to be removed by a nail salon, please have this removed prior to surgery. Artificial nails or gel coating may interfere with anesthesia's ability to adequately monitor your vital signs.  [facility] is not responsible for any belongings or valuables.    Do NOT Smoke (Tobacco/Vaping)  24 hours prior to your procedure  If you use a CPAP at  night, you may bring your mask for your overnight stay.   Contacts, glasses, hearing aids, dentures or partials may not be worn into surgery, please bring cases for these belongings   For patients admitted to the hospital, discharge time will be determined by your treatment team.   Patients discharged the day of surgery will not be allowed to drive home, and someone needs to stay with them for 24 hours.  Special instructions:    Oral Hygiene is also important to reduce your risk of infection.  Remember - BRUSH YOUR TEETH THE MORNING OF SURGERY WITH YOUR REGULAR TOOTHPASTE  [facility]- Preparing For Surgery  Before surgery, you can play an important role. Because skin is not sterile, your skin needs to be as free of germs as possible. You can reduce the number of germs on your skin by washing with Antibacterial Soap before surgery.     Please follow these instructions carefully.     Shower the NIGHT BEFORE SURGERY and the MORNING OF SURGERY with Antibacterial Soap.   Pat yourself dry with a CLEAN TOWEL.  Wear CLEAN PAJAMAS to bed the night before surgery  Place CLEAN SHEETS on your bed the night before your surgery  DO NOT SLEEP WITH PETS.  Day of Surgery:  Take a shower with Antibacterial soap. Wear Clean/Comfortable clothing the morning of surgery Do not apply any deodorants/lotions.  Remember to brush your teeth WITH YOUR REGULAR TOOTHPASTE.   If you test positive for Covid, or been in contact with anyone that has tested positive in the last 10 days, please notify your surgeon.  SURGICAL WAITING ROOM VISITATION Patients having surgery or a procedure may have no more than 2 support people in the waiting area - these visitors may rotate.   Children under the age of 55 must have an adult with them who is not the patient. If the patient needs to stay at the hospital during part of their recovery, the visitor guidelines for inpatient rooms apply. Pre-op nurse will coordinate  an appropriate time for 1 support person to accompany patient in pre-op.  This support person may not rotate.   Please refer to the [facility] website for the visitor guidelines for Inpatients (after your surgery is over and you are in a regular room).

## 2022-04-14 NOTE — H&P (Signed)
Paige Hardy is an 56 y.o. female.   Chief Complaint: Right ankle pain and painful loose hardware. HPI: Patient is a 56 year old woman who is seen for initial evaluation for painful retained hardware right ankle laterally as well as painful traumatic arthritis in the right ankle. Patient states she had surgery about 16 years ago from a fall off a horse. Patient was treated near [facility]. She states Dr. Meredeth Ide. Patient complains of pain with activities of daily living.   Past Medical History:  Diagnosis Date   Essential hypertension    Mixed hyperlipidemia    Vitamin D deficiency     Past Surgical History:  Procedure Laterality Date   ANKLE FRACTURE SURGERY Right    CHOLECYSTECTOMY      Family History  Problem Relation Age of Onset   Hypertension Other    Prostate cancer Father    Hypertension Maternal Grandfather    Diabetes Maternal Grandfather    Social History:  reports that she has never smoked. She has never used smokeless tobacco. She reports current alcohol use. She reports that she does not use drugs.  Allergies: No Known Allergies  No medications prior to admission.    No results found for this or any previous visit (from the past 48 hour(s)). No results found.  Review of Systems  All other systems reviewed and are negative.   Height 5\' 5"  (1.651 m), weight 90.7 kg, last menstrual period 08/03/2021. Physical Exam  Patient is alert, oriented, no adenopathy, well-dressed, normal affect, normal respiratory effort. Examination patient has a good dorsalis pedis pulse she has pain to palpation over the anterior joint line.  The screws from the fibular plate have backed out and there is a prominent painful screw and painful hardware.  Patient's radiographs show traumatic arthritis of the tibial talar joint and prominent hardware.  There are no open wounds. Assessment/Plan 1. Post-traumatic arthritis of right ankle   2. Pain from implanted hardware,  initial encounter       Plan: Discussed initial treatment options would be to remove the prominent hardware lateral malleolus either 1 screw or the screw and all hardware and ankle arthroscopy for the traumatic arthritis.  Risk and benefits were discussed including persistent pain need for additional surgery.  Patient states she understands and wishes to proceed with surgery.  10/03/2021, MD 04/14/2022, 5:17 PM

## 2022-04-14 NOTE — Anesthesia Preprocedure Evaluation (Addendum)
Anesthesia Evaluation  Patient identified by MRN, date of birth, ID band Patient awake    Reviewed: Allergy & Precautions, NPO status , Patient's Chart, lab work & pertinent test results  Airway Mallampati: II  TM Distance: >3 FB Neck ROM: Full    Dental no notable dental hx. (+) Teeth Intact, Dental Advisory Given   Pulmonary neg pulmonary ROS   Pulmonary exam normal breath sounds clear to auscultation       Cardiovascular hypertension, Pt. on medications Normal cardiovascular exam Rhythm:Regular Rate:Normal  HLD   Neuro/Psych negative neurological ROS  negative psych ROS   GI/Hepatic negative GI ROS, Neg liver ROS,,,  Endo/Other  negative endocrine ROS    Renal/GU negative Renal ROS  negative genitourinary   Musculoskeletal negative musculoskeletal ROS (+)    Abdominal   Peds  Hematology negative hematology ROS (+)   Anesthesia Other Findings   Reproductive/Obstetrics                             Anesthesia Physical Anesthesia Plan  ASA: 2  Anesthesia Plan: General   Post-op Pain Management: Tylenol PO (pre-op)*   Induction: Intravenous  PONV Risk Score and Plan: 3 and Ondansetron, Dexamethasone and Midazolam  Airway Management Planned: LMA  Additional Equipment:   Intra-op Plan:   Post-operative Plan: Extubation in OR  Informed Consent: I have reviewed the patients History and Physical, chart, labs and discussed the procedure including the risks, benefits and alternatives for the proposed anesthesia with the patient or authorized representative who has indicated his/her understanding and acceptance.     Dental advisory given  Plan Discussed with: CRNA  Anesthesia Plan Comments:        Anesthesia Quick Evaluation

## 2022-04-15 ENCOUNTER — Other Ambulatory Visit: Payer: Self-pay

## 2022-04-15 ENCOUNTER — Other Ambulatory Visit (HOSPITAL_COMMUNITY): Payer: Self-pay

## 2022-04-15 ENCOUNTER — Encounter (HOSPITAL_COMMUNITY): Payer: Self-pay | Admitting: Orthopedic Surgery

## 2022-04-15 ENCOUNTER — Ambulatory Visit (HOSPITAL_COMMUNITY)
Admission: RE | Admit: 2022-04-15 | Discharge: 2022-04-15 | Disposition: A | Discharge status: Home / Self Care | Payer: 59 | Source: Ambulatory Visit | Attending: Orthopedic Surgery | Admitting: Orthopedic Surgery

## 2022-04-15 ENCOUNTER — Ambulatory Visit (HOSPITAL_COMMUNITY): Payer: 59 | Admitting: Certified Registered Nurse Anesthetist

## 2022-04-15 ENCOUNTER — Ambulatory Visit (HOSPITAL_BASED_OUTPATIENT_CLINIC_OR_DEPARTMENT_OTHER): Payer: 59 | Admitting: Certified Registered Nurse Anesthetist

## 2022-04-15 ENCOUNTER — Ambulatory Visit (HOSPITAL_COMMUNITY): Payer: 59

## 2022-04-15 ENCOUNTER — Encounter (HOSPITAL_COMMUNITY)
Admission: RE | Disposition: A | Discharge status: Home / Self Care | Payer: Self-pay | Source: Ambulatory Visit | Attending: Orthopedic Surgery

## 2022-04-15 DIAGNOSIS — M19071 Primary osteoarthritis, right ankle and foot: Secondary | ICD-10-CM | POA: Insufficient documentation

## 2022-04-15 DIAGNOSIS — I10 Essential (primary) hypertension: Secondary | ICD-10-CM | POA: Insufficient documentation

## 2022-04-15 DIAGNOSIS — T8484XA Pain due to internal orthopedic prosthetic devices, implants and grafts, initial encounter: Secondary | ICD-10-CM

## 2022-04-15 DIAGNOSIS — M12571 Traumatic arthropathy, right ankle and foot: Secondary | ICD-10-CM

## 2022-04-15 DIAGNOSIS — T85848A Pain due to other internal prosthetic devices, implants and grafts, initial encounter: Secondary | ICD-10-CM | POA: Diagnosis not present

## 2022-04-15 DIAGNOSIS — Y831 Surgical operation with implant of artificial internal device as the cause of abnormal reaction of the patient, or of later complication, without mention of misadventure at the time of the procedure: Secondary | ICD-10-CM | POA: Diagnosis not present

## 2022-04-15 DIAGNOSIS — E785 Hyperlipidemia, unspecified: Secondary | ICD-10-CM | POA: Insufficient documentation

## 2022-04-15 HISTORY — PX: HARDWARE REMOVAL: SHX979

## 2022-04-15 HISTORY — PX: ANKLE ARTHROSCOPY: SHX545

## 2022-04-15 LAB — BASIC METABOLIC PANEL
Anion gap: 9 (ref 5–15)
BUN: 11 mg/dL (ref 6–20)
CO2: 28 mmol/L (ref 22–32)
Calcium: 8.5 mg/dL — ABNORMAL LOW (ref 8.9–10.3)
Chloride: 103 mmol/L (ref 98–111)
Creatinine, Ser: 1.06 mg/dL — ABNORMAL HIGH (ref 0.44–1.00)
GFR, Estimated: >60 mL/min (ref >60)
Glucose, Bld: 107 mg/dL — ABNORMAL HIGH (ref 70–99)
Potassium: 3.7 mmol/L (ref 3.5–5.1)
Sodium: 140 mmol/L (ref 135–145)

## 2022-04-15 LAB — CBC
HCT: 40.7 % (ref 36.0–46.0)
Hemoglobin: 12.9 g/dL (ref 12.0–15.0)
MCH: 27.2 pg (ref 26.0–34.0)
MCHC: 31.7 g/dL (ref 30.0–36.0)
MCV: 85.7 fL (ref 80.0–100.0)
Platelets: 291 10*3/uL (ref 150–400)
RBC: 4.75 MIL/uL (ref 3.87–5.11)
RDW: 14.1 % (ref 11.5–15.5)
WBC: 8.7 10*3/uL (ref 4.0–10.5)
nRBC: 0 % (ref 0.0–0.2)

## 2022-04-15 LAB — POCT PREGNANCY, URINE: Preg Test, Ur: NEGATIVE

## 2022-04-15 SURGERY — ARTHROSCOPY, ANKLE
Anesthesia: General | Site: Ankle | Laterality: Right

## 2022-04-15 MED ORDER — PROPOFOL 10 MG/ML IV BOLUS
INTRAVENOUS | Status: DC | PRN
  Administered 2022-04-15: 150 mg via INTRAVENOUS

## 2022-04-15 MED ORDER — PROPOFOL 10 MG/ML IV BOLUS
INTRAVENOUS | Status: AC
  Filled 2022-04-15: qty 20

## 2022-04-15 MED ORDER — FENTANYL CITRATE (PF) 100 MCG/2ML IJ SOLN: 25.0000 ug | INTRAMUSCULAR | Status: DC | PRN

## 2022-04-15 MED ORDER — ORAL CARE MOUTH RINSE: 15.0000 mL | Freq: Once | OROMUCOSAL | Status: AC

## 2022-04-15 MED ORDER — CHLORHEXIDINE GLUCONATE 0.12 % MT SOLN
15.0000 mL | Freq: Once | OROMUCOSAL | Status: AC
  Administered 2022-04-15: 15 mL via OROMUCOSAL
  Filled 2022-04-15: qty 15

## 2022-04-15 MED ORDER — MIDAZOLAM HCL 2 MG/2ML IJ SOLN
INTRAMUSCULAR | Status: AC
  Filled 2022-04-15: qty 2

## 2022-04-15 MED ORDER — ACETAMINOPHEN 500 MG PO TABS
1000.0000 mg | ORAL_TABLET | Freq: Once | ORAL | Status: AC
  Administered 2022-04-15: 1000 mg via ORAL
  Filled 2022-04-15: qty 2

## 2022-04-15 MED ORDER — LACTATED RINGERS IV SOLN: INTRAVENOUS | Status: DC

## 2022-04-15 MED ORDER — DEXAMETHASONE SODIUM PHOSPHATE 10 MG/ML IJ SOLN
INTRAMUSCULAR | Status: DC | PRN
  Administered 2022-04-15: 5 mg via INTRAVENOUS

## 2022-04-15 MED ORDER — FENTANYL CITRATE (PF) 250 MCG/5ML IJ SOLN
INTRAMUSCULAR | Status: DC | PRN
  Administered 2022-04-15: 25 ug via INTRAVENOUS
  Administered 2022-04-15 (×2): 50 ug via INTRAVENOUS
  Administered 2022-04-15: 25 ug via INTRAVENOUS
  Administered 2022-04-15: 50 ug via INTRAVENOUS

## 2022-04-15 MED ORDER — LIDOCAINE 2% (20 MG/ML) 5 ML SYRINGE
INTRAMUSCULAR | Status: DC | PRN
  Administered 2022-04-15: 60 mg via INTRAVENOUS

## 2022-04-15 MED ORDER — PHENYLEPHRINE 80 MCG/ML (10ML) SYRINGE FOR IV PUSH (FOR BLOOD PRESSURE SUPPORT)
PREFILLED_SYRINGE | INTRAVENOUS | Status: DC | PRN
  Administered 2022-04-15: 80 ug via INTRAVENOUS
  Administered 2022-04-15 (×2): 160 ug via INTRAVENOUS

## 2022-04-15 MED ORDER — FENTANYL CITRATE (PF) 250 MCG/5ML IJ SOLN
INTRAMUSCULAR | Status: AC
  Filled 2022-04-15: qty 5

## 2022-04-15 MED ORDER — SODIUM CHLORIDE 0.9 % IR SOLN: Status: DC | PRN

## 2022-04-15 MED ORDER — HYDROCODONE-ACETAMINOPHEN 5-325 MG PO TABS: 1.0000 | ORAL_TABLET | ORAL | 0 refills | Status: DC | PRN

## 2022-04-15 MED ORDER — BUPIVACAINE HCL (PF) 0.25 % IJ SOLN
INTRAMUSCULAR | Status: DC | PRN
  Administered 2022-04-15: 10 mL

## 2022-04-15 MED ORDER — HYDROCODONE-ACETAMINOPHEN 5-325 MG PO TABS
1.0000 | ORAL_TABLET | ORAL | 0 refills | Status: DC | PRN
  Filled 2022-04-15: qty 30, 5d supply, fill #0

## 2022-04-15 MED ORDER — BUPIVACAINE HCL (PF) 0.25 % IJ SOLN
INTRAMUSCULAR | Status: AC
  Filled 2022-04-15: qty 10

## 2022-04-15 MED ORDER — ONDANSETRON HCL 4 MG/2ML IJ SOLN
INTRAMUSCULAR | Status: DC | PRN
  Administered 2022-04-15: 4 mg via INTRAVENOUS

## 2022-04-15 MED ORDER — CEFAZOLIN SODIUM-DEXTROSE 2-4 GM/100ML-% IV SOLN
2.0000 g | INTRAVENOUS | Status: AC
  Administered 2022-04-15: 2 g via INTRAVENOUS
  Filled 2022-04-15: qty 100

## 2022-04-15 MED ORDER — MIDAZOLAM HCL 2 MG/2ML IJ SOLN
INTRAMUSCULAR | Status: DC | PRN
  Administered 2022-04-15: 2 mg via INTRAVENOUS

## 2022-04-15 SURGICAL SUPPLY — 61 items
BAG COUNTER SPONGE SURGICOUNT (BAG) ×2 IMPLANT
BANDAGE ESMARK 6X9 LF (GAUZE/BANDAGES/DRESSINGS) IMPLANT
BLADE CUDA 5.5 (BLADE) IMPLANT
BLADE EXCALIBUR 4.0X13 (MISCELLANEOUS) IMPLANT
BNDG COHESIVE 4X5 TAN STRL (GAUZE/BANDAGES/DRESSINGS) IMPLANT
BNDG COHESIVE 6X5 TAN NS LF (GAUZE/BANDAGES/DRESSINGS) IMPLANT
BNDG COHESIVE 6X5 TAN STRL LF (GAUZE/BANDAGES/DRESSINGS) ×2 IMPLANT
BNDG ESMARK 6X9 LF (GAUZE/BANDAGES/DRESSINGS)
BNDG GAUZE DERMACEA FLUFF 4 (GAUZE/BANDAGES/DRESSINGS) ×2 IMPLANT
BUR OVAL 4.0 (BURR) IMPLANT
COVER SURGICAL LIGHT HANDLE (MISCELLANEOUS) ×4 IMPLANT
CUFF TOURN SGL QUICK 34 (TOURNIQUET CUFF)
CUFF TOURN SGL QUICK 42 (TOURNIQUET CUFF) IMPLANT
CUFF TRNQT CYL 34X4.125X (TOURNIQUET CUFF) IMPLANT
DRAPE ARTHROSCOPY W/POUCH 114 (DRAPES) ×2 IMPLANT
DRAPE C-ARM 42X72 X-RAY (DRAPES) IMPLANT
DRAPE INCISE IOBAN 66X45 STRL (DRAPES) IMPLANT
DRAPE OEC MINIVIEW 54X84 (DRAPES) IMPLANT
DRAPE ORTHO SPLIT 77X108 STRL (DRAPES)
DRAPE SURG ORHT 6 SPLT 77X108 (DRAPES) IMPLANT
DRAPE U-SHAPE 47X51 STRL (DRAPES) ×2 IMPLANT
DRSG ADAPTIC 3X8 NADH LF (GAUZE/BANDAGES/DRESSINGS) IMPLANT
DRSG EMULSION OIL 3X3 NADH (GAUZE/BANDAGES/DRESSINGS) ×2 IMPLANT
DURAPREP 26ML APPLICATOR (WOUND CARE) ×2 IMPLANT
ELECT REM PT RETURN 9FT ADLT (ELECTROSURGICAL) ×2
ELECTRODE REM PT RTRN 9FT ADLT (ELECTROSURGICAL) ×2 IMPLANT
GAUZE PAD ABD 8X10 STRL (GAUZE/BANDAGES/DRESSINGS) ×2 IMPLANT
GAUZE SPONGE 4X4 12PLY STRL (GAUZE/BANDAGES/DRESSINGS) ×2 IMPLANT
GLOVE BIOGEL PI IND STRL 9 (GLOVE) ×2 IMPLANT
GLOVE SURG ORTHO 9.0 STRL STRW (GLOVE) ×2 IMPLANT
GOWN STRL REUS W/ TWL XL LVL3 (GOWN DISPOSABLE) ×6 IMPLANT
GOWN STRL REUS W/TWL XL LVL3 (GOWN DISPOSABLE) ×6
KIT BASIN OR (CUSTOM PROCEDURE TRAY) ×2 IMPLANT
KIT TURNOVER KIT B (KITS) ×2 IMPLANT
MANIFOLD NEPTUNE II (INSTRUMENTS) ×2 IMPLANT
NDL 18GX1X1/2 (RX/OR ONLY) (NEEDLE) ×2 IMPLANT
NEEDLE 18GX1X1/2 (RX/OR ONLY) (NEEDLE) ×2 IMPLANT
NS IRRIG 1000ML POUR BTL (IV SOLUTION) ×2 IMPLANT
PACK ARTHROSCOPY DSU (CUSTOM PROCEDURE TRAY) ×2 IMPLANT
PACK ORTHO EXTREMITY (CUSTOM PROCEDURE TRAY) ×2 IMPLANT
PAD ARMBOARD 7.5X6 YLW CONV (MISCELLANEOUS) ×4 IMPLANT
PADDING CAST COTTON 6X4 STRL (CAST SUPPLIES) ×2 IMPLANT
PORT APPOLLO RF 90DEGREE MULTI (SURGICAL WAND) IMPLANT
SPONGE T-LAP 18X18 ~~LOC~~+RFID (SPONGE) IMPLANT
SPONGE T-LAP 4X18 ~~LOC~~+RFID (SPONGE) ×2 IMPLANT
STAPLER VISISTAT 35W (STAPLE) IMPLANT
STOCKINETTE IMPERVIOUS 9X36 MD (GAUZE/BANDAGES/DRESSINGS) IMPLANT
SUT ETHILON 2 0 PSLX (SUTURE) IMPLANT
SUT ETHILON 4 0 PS 2 18 (SUTURE) ×2 IMPLANT
SUT MNCRL AB 3-0 PS2 18 (SUTURE) IMPLANT
SUT VIC AB 0 CT1 27 (SUTURE)
SUT VIC AB 0 CT1 27XBRD ANBCTR (SUTURE) IMPLANT
SUT VIC AB 2-0 CT1 27 (SUTURE)
SUT VIC AB 2-0 CT1 TAPERPNT 27 (SUTURE) IMPLANT
SYR 20ML LL LF (SYRINGE) ×2 IMPLANT
TAPE STRIPS DRAPE STRL (GAUZE/BANDAGES/DRESSINGS) IMPLANT
TOWEL GREEN STERILE (TOWEL DISPOSABLE) ×2 IMPLANT
TOWEL GREEN STERILE FF (TOWEL DISPOSABLE) ×2 IMPLANT
TUBING ARTHROSCOPY IRRIG 16FT (MISCELLANEOUS) ×2 IMPLANT
UNDERPAD 30X36 HEAVY ABSORB (UNDERPADS AND DIAPERS) ×2 IMPLANT
WATER STERILE IRR 1000ML POUR (IV SOLUTION) ×2 IMPLANT

## 2022-04-15 NOTE — Op Note (Signed)
04/15/2022  8:31 AM  PATIENT:  Paige Hardy    PRE-OPERATIVE DIAGNOSIS:  Painful Hardware and Traumatic Arthritis Right Ankle  POST-OPERATIVE DIAGNOSIS:  Same  PROCEDURE:  RIGHT ANKLE ARTHROSCOPY, HARDWARE REMOVAL RIGHT ANKLE  SURGEON:  [name], MD  PHYSICIAN ASSISTANT:None ANESTHESIA:   General  PREOPERATIVE INDICATIONS:  Paige Hardy is a  56 y.o. female with a diagnosis of Painful Hardware and Traumatic Arthritis Right Ankle who failed conservative measures and elected for surgical management.    The risks benefits and alternatives were discussed with the patient preoperatively including but not limited to the risks of infection, bleeding, nerve injury, cardiopulmonary complications, the need for revision surgery, among others, and the patient was willing to proceed.  OPERATIVE IMPLANTS: None  @ENCIMAGES @  OPERATIVE FINDINGS: Traumatic arthritis right ankle  OPERATIVE PROCEDURE: Patient was brought the operating room underwent general anesthetic.  After adequate levels anesthesia were obtained patient's right lower extremity was prepped using DuraPrep draped into a sterile field a timeout was called.  A lateral incision was made over the fibula through her previous incision.  This was carried sharply down to bone and hardware.  The distal screw that was painful was removed without complications.  The wound was irrigated with normal saline and the incision closed using 3-0 nylon.  Attention was then focused.  18-gauge needle was used to insert into the anterior medial portal the skin was incised and blunt dissection was carried down to the capsule with a blunt trocar entering inside the joint.  Using a light from the camera the anterior lateral portal was identified the 18-gauge needle was inserted into the joint the skin was incised blunt dissection was carried down the capsule and the blunt trocar was used to create the portal the shaver was inserted.  Patient had extensive  synovitis.  The medial and lateral gutters and anterior was debrided of synovitis.  Osteophytic bone spurs anteriorly over the tibia were debrided.  There was osteochondral changes of the talar dome which was also debrided.  Electrocautery was used for hemostasis.  The instruments were removed the portals were closed using 3-0 nylon a sterile dressing was applied patient was extubated taken to PACU in stable condition.   DISCHARGE PLANNING:  Antibiotic duration: Preoperative antibiotics  Weightbearing: Weightbearing as tolerated  Pain medication: Prescription for Vicodin  Dressing care/ Wound VAC: Change dressing in 2 days  Ambulatory devices: Crutches  Discharge to: Home.  Follow-up: In the office 1 week post operative.

## 2022-04-15 NOTE — Progress Notes (Addendum)
Orthopedic Tech Progress Note Patient Details:  HALLEL DENHERDER 1965/06/15 [id number]  Ortho Devices Type of Ortho Device: Postop shoe/boot Ortho Device/Splint Location: right foot Ortho Device/Splint Interventions: Ordered, Application, Adjustment   Post Interventions Patient Tolerated: Well Instructions Provided: Adjustment of device  RN made aware that order for post op shoe has not been released at this time for ortho tech to see or complete in order section of chart, although device has been delivered and applied.  Order for crutches as well which cannot be seen under ortho tech log in.  Per RN report, pt already has crutches.  Will check back  Tavius Turgeon OTR/L 04/15/2022, 9:02 AM

## 2022-04-15 NOTE — Anesthesia Postprocedure Evaluation (Signed)
Anesthesia Post Note  Patient: LANASIA PORRAS  Procedure(s) Performed: RIGHT ANKLE ARTHROSCOPY (Right: Ankle) HARDWARE REMOVAL RIGHT ANKLE (Right)     Patient location during evaluation: PACU Anesthesia Type: General Level of consciousness: awake and alert Pain management: pain level controlled Vital Signs Assessment: post-procedure vital signs reviewed and stable Respiratory status: spontaneous breathing, nonlabored ventilation, respiratory function stable and patient connected to nasal cannula oxygen Cardiovascular status: blood pressure returned to baseline and stable Postop Assessment: no apparent nausea or vomiting Anesthetic complications: no  No notable events documented.  Last Vitals:  Vitals:   04/15/22 0845 04/15/22 0900  BP: (!) 145/102 (!) 140/99  Pulse: 74 73  Resp: 11 13  Temp:  36.6 C  SpO2: 96% 96%    Last Pain:  Vitals:   04/15/22 0900  TempSrc:   PainSc: 0-No pain                 Hazelyn Kallen L Alania Overholt

## 2022-04-15 NOTE — Anesthesia Procedure Notes (Signed)
Procedure Name: LMA Insertion Date/Time: 04/15/2022 7:45 AM  Performed by: Lelon Perla, CRNAPre-anesthesia Checklist: Patient identified, Emergency Drugs available, Suction available and Patient being monitored Patient Re-evaluated:Patient Re-evaluated prior to induction Oxygen Delivery Method: Circle System Utilized Preoxygenation: Pre-oxygenation with 100% oxygen Induction Type: IV induction Ventilation: Mask ventilation without difficulty LMA: LMA inserted LMA Size: 4.0 Number of attempts: 1 Airway Equipment and Method: Bite block Placement Confirmation: positive ETCO2 Tube secured with: Tape Dental Injury: Teeth and Oropharynx as per pre-operative assessment

## 2022-04-15 NOTE — Interval H&P Note (Signed)
History and Physical Interval Note:  04/15/2022 6:34 AM  Paige Hardy  has presented today for surgery, with the diagnosis of Painful Hardware and Traumatic Arthritis Right Ankle.  The various methods of treatment have been discussed with the patient and family. After consideration of risks, benefits and other options for treatment, the patient has consented to  Procedure(s): RIGHT ANKLE ARTHROSCOPY (Right) HARDWARE REMOVAL RIGHT ANKLE (Right) as a surgical intervention.  The patient's history has been reviewed, patient examined, no change in status, stable for surgery.  I have reviewed the patient's chart and labs.  Questions were answered to the patient's satisfaction.     [name]

## 2022-04-15 NOTE — Transfer of Care (Signed)
Immediate Anesthesia Transfer of Care Note  Patient: Paige Hardy  Procedure(s) Performed: RIGHT ANKLE ARTHROSCOPY (Right: Ankle) HARDWARE REMOVAL RIGHT ANKLE (Right)  Patient Location: PACU  Anesthesia Type:General  Level of Consciousness: awake, alert , drowsy, and patient cooperative  Airway & Oxygen Therapy: Patient Spontanous Breathing  Post-op Assessment: Report given to RN and Post -op Vital signs reviewed and stable  Post vital signs: Reviewed and stable  Last Vitals:  Vitals Value Taken Time  BP 147/104 04/15/22 0830  Temp    Pulse 86 04/15/22 0832  Resp 12 04/15/22 0832  SpO2 96 % 04/15/22 0832  Vitals shown include unvalidated device data.  Last Pain:  Vitals:   04/15/22 0605  TempSrc: Oral  PainSc:       Patients Stated Pain Goal: 2 (04/13/22 1705)  Complications: No notable events documented.

## 2022-04-16 ENCOUNTER — Encounter (HOSPITAL_COMMUNITY): Payer: Self-pay | Admitting: Orthopedic Surgery

## 2022-04-21 ENCOUNTER — Other Ambulatory Visit: Payer: Self-pay

## 2022-04-22 ENCOUNTER — Other Ambulatory Visit: Payer: Self-pay

## 2022-04-27 ENCOUNTER — Other Ambulatory Visit: Payer: Self-pay

## 2022-04-28 ENCOUNTER — Ambulatory Visit (INDEPENDENT_AMBULATORY_CARE_PROVIDER_SITE_OTHER): Payer: 59 | Admitting: Orthopedic Surgery

## 2022-04-28 ENCOUNTER — Other Ambulatory Visit: Payer: Self-pay

## 2022-04-28 DIAGNOSIS — M19171 Post-traumatic osteoarthritis, right ankle and foot: Secondary | ICD-10-CM

## 2022-04-28 DIAGNOSIS — T85848A Pain due to other internal prosthetic devices, implants and grafts, initial encounter: Secondary | ICD-10-CM

## 2022-04-29 ENCOUNTER — Encounter: Payer: Self-pay | Admitting: Orthopedic Surgery

## 2022-04-29 NOTE — Progress Notes (Signed)
Office Visit Note   Patient: Paige Hardy           Date of Birth: 20-Jul-1965           MRN: [id number] Visit Date: 04/28/2022              Requested by: Rochel Brome, MD [address] [place],  [place] [address] PCP: Rochel Brome, MD  Chief Complaint  Patient presents with   Right Ankle - Routine Post Op    04/15/2022 right ankle scope and HDW removal       HPI: Patient is a 57 year old woman who is 2 weeks status post right ankle arthroscopy and removal of deep retained hardware.  She is in regular shoewear full weightbearing.  Patient feels well has no complaints of pain.  Assessment & Plan: Visit Diagnoses:  1. Post-traumatic arthritis of right ankle   2. Pain from implanted hardware, initial encounter     Plan: Sutures harvested she will work on strengthening increase her activities follow-up in 4 weeks  Follow-Up Instructions: Return in about 4 weeks (around 05/26/2022).   Ortho Exam  Patient is alert, oriented, no adenopathy, well-dressed, normal affect, normal respiratory effort. Examination the portals and incision are well-healed there is no redness no cellulitis no drainage.  Patient has no pain with range of motion of her ankle.  No pain with weightbearing.  Imaging: No results found. No images are attached to the encounter.  Labs: Lab Results  Component Value Date   HGBA1C 5.4 05/25/2020     Lab Results  Component Value Date   ALBUMIN 4.6 12/07/2020   ALBUMIN 4.3 08/24/2020   ALBUMIN 4.5 05/25/2020    No results found for: "MG" Lab Results  Component Value Date   VD25OH 46.3 08/14/2019    No results found for: "PREALBUMIN"    Latest Ref Rng & Units 04/15/2022    5:57 AM 12/07/2020    8:10 AM 08/24/2020    8:08 AM  CBC EXTENDED  WBC 4.0 - 10.5 K/uL 8.7  7.4  8.4   RBC 3.87 - 5.11 MIL/uL 4.75  4.76  4.60   Hemoglobin 12.0 - 15.0 g/dL 12.9  13.0  12.6   HCT 36.0 - 46.0 % 40.7  41.3  39.4   Platelets 150 - 400 K/uL 291  239  272    NEUT# 1.4 - 7.0 x10E3/uL  4.5  5.7   Lymph# 0.7 - 3.1 x10E3/uL  2.2  2.0      There is no height or weight on file to calculate BMI.  Orders:  No orders of the defined types were placed in this encounter.  No orders of the defined types were placed in this encounter.    Procedures: No procedures performed  Clinical Data: No additional findings.  ROS:  All other systems negative, except as noted in the HPI. Review of Systems  Objective: Vital Signs: LMP 08/03/2021 (Approximate)   Specialty Comments:  No specialty comments available.  PMFS History: Patient Active Problem List   Diagnosis Date Noted   Pain from implanted hardware 04/15/2022   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   Acute infection of nasal sinus 03/17/2021   Colon cancer screening 11/20/2019   Class 1 obesity due to excess calories with serious comorbidity and body mass index (BMI) of 30.0 to 30.9 in adult 08/20/2019   Essential hypertension    Mixed hyperlipidemia    Vitamin D deficiency    Past Medical  History:  Diagnosis Date   Essential hypertension    Mixed hyperlipidemia    Vitamin D deficiency     Family History  Problem Relation Age of Onset   Hypertension Other    Prostate cancer Father    Hypertension Maternal Grandfather    Diabetes Maternal Grandfather     Past Surgical History:  Procedure Laterality Date   ANKLE ARTHROSCOPY Right 04/15/2022   Procedure: RIGHT ANKLE ARTHROSCOPY;  Surgeon: Newt Minion, MD;  Location: [facility];  Service: Orthopedics;  Laterality: Right;   ANKLE FRACTURE SURGERY Right    CHOLECYSTECTOMY     HARDWARE REMOVAL Right 04/15/2022   Procedure: HARDWARE REMOVAL RIGHT ANKLE;  Surgeon: Newt Minion, MD;  Location: [facility];  Service: Orthopedics;  Laterality: Right;   Social History   Occupational History   Not on file  Tobacco Use   Smoking status: Never   Smokeless tobacco: Never  Vaping Use   Vaping Use: Never used  Substance and Sexual  Activity   Alcohol use: Yes    Comment: socially   Drug use: Never   Sexual activity: Not on file

## 2022-05-20 ENCOUNTER — Other Ambulatory Visit: Payer: Self-pay

## 2022-05-23 ENCOUNTER — Other Ambulatory Visit: Payer: Self-pay

## 2022-05-24 ENCOUNTER — Telehealth: Payer: Self-pay | Admitting: Sleep Medicine

## 2022-05-24 NOTE — Telephone Encounter (Unsigned)
Copied from CRM #[id number]. Topic: Appointments - Appointment Information  >> May 24, 2022 12:23 PM Gifford Shave wrote:  Request for a new patient consultation for: sleep apnea    Dr. Sandi Mealy is faxing a referral to [place] on 1/30 for this visit for [place]    Does the patient have insurance? YES/NO: Yes   - If no insurance, was [facility] notified to update insurance ?  YES/NO: No    Will the patient be using medical transport to attend their appointment? YES/NO: No    Date/Time of appointment:  02/09/23 at [facility]

## 2022-05-25 ENCOUNTER — Encounter: Payer: Self-pay | Admitting: Gastroenterology

## 2022-05-26 ENCOUNTER — Ambulatory Visit (INDEPENDENT_AMBULATORY_CARE_PROVIDER_SITE_OTHER): Payer: 59 | Admitting: Orthopedic Surgery

## 2022-05-26 DIAGNOSIS — M19171 Post-traumatic osteoarthritis, right ankle and foot: Secondary | ICD-10-CM

## 2022-05-27 ENCOUNTER — Encounter: Payer: Self-pay | Admitting: Orthopedic Surgery

## 2022-05-27 NOTE — Progress Notes (Signed)
Office Visit Note   Patient: Paige Hardy           Date of Birth: 10/23/1965           MRN: [id number] Visit Date: 05/26/2022              Requested by: Rochel Brome, MD [address] [place],  [place] [address] PCP: Rochel Brome, MD  Chief Complaint  Patient presents with   Right Ankle - Routine Post Op      HPI: Patient is a 57 year old woman who is status post right ankle arthroscopy and hardware removal.  Assessment & Plan: Visit Diagnoses:  1. Post-traumatic arthritis of right ankle     Plan: Patient will work on exercise and strengthening.  Follow-Up Instructions: Return if symptoms worsen or fail to improve.   Ortho Exam  Patient is alert, oriented, no adenopathy, well-dressed, normal affect, normal respiratory effort. Patient is asymptomatic she has no pain with active or passive range of motion of the ankle.  She states she does have some start up stiffness but has no symptoms with her ankle at this time.  Imaging: No results found. No images are attached to the encounter.  Labs: Lab Results  Component Value Date   HGBA1C 5.4 05/25/2020     Lab Results  Component Value Date   ALBUMIN 4.6 12/07/2020   ALBUMIN 4.3 08/24/2020   ALBUMIN 4.5 05/25/2020    No results found for: "MG" Lab Results  Component Value Date   VD25OH 46.3 08/14/2019    No results found for: "PREALBUMIN"    Latest Ref Rng & Units 04/15/2022    5:57 AM 12/07/2020    8:10 AM 08/24/2020    8:08 AM  CBC EXTENDED  WBC 4.0 - 10.5 K/uL 8.7  7.4  8.4   RBC 3.87 - 5.11 MIL/uL 4.75  4.76  4.60   Hemoglobin 12.0 - 15.0 g/dL 12.9  13.0  12.6   HCT 36.0 - 46.0 % 40.7  41.3  39.4   Platelets 150 - 400 K/uL 291  239  272   NEUT# 1.4 - 7.0 x10E3/uL  4.5  5.7   Lymph# 0.7 - 3.1 x10E3/uL  2.2  2.0      There is no height or weight on file to calculate BMI.  Orders:  No orders of the defined types were placed in this encounter.  No orders of the defined types were  placed in this encounter.    Procedures: No procedures performed  Clinical Data: No additional findings.  ROS:  All other systems negative, except as noted in the HPI. Review of Systems  Objective: Vital Signs: There were no vitals taken for this visit.  Specialty Comments:  No specialty comments available.  PMFS History: Patient Active Problem List   Diagnosis Date Noted   Pain from implanted hardware 04/15/2022   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   Acute infection of nasal sinus 03/17/2021   Colon cancer screening 11/20/2019   Class 1 obesity due to excess calories with serious comorbidity and body mass index (BMI) of 30.0 to 30.9 in adult 08/20/2019   Essential hypertension    Mixed hyperlipidemia    Vitamin D deficiency    Past Medical History:  Diagnosis Date   Essential hypertension    Mixed hyperlipidemia    Vitamin D deficiency     Family History  Problem Relation Age of Onset   Hypertension Other    Prostate  cancer Father    Hypertension Maternal Grandfather    Diabetes Maternal Grandfather     Past Surgical History:  Procedure Laterality Date   ANKLE ARTHROSCOPY Right 04/15/2022   Procedure: RIGHT ANKLE ARTHROSCOPY;  Surgeon: Newt Minion, MD;  Location: [facility];  Service: Orthopedics;  Laterality: Right;   ANKLE FRACTURE SURGERY Right    CHOLECYSTECTOMY     HARDWARE REMOVAL Right 04/15/2022   Procedure: HARDWARE REMOVAL RIGHT ANKLE;  Surgeon: Newt Minion, MD;  Location: [facility];  Service: Orthopedics;  Laterality: Right;   Social History   Occupational History   Not on file  Tobacco Use   Smoking status: Never   Smokeless tobacco: Never  Vaping Use   Vaping Use: Never used  Substance and Sexual Activity   Alcohol use: Yes    Comment: socially   Drug use: Never   Sexual activity: Not on file

## 2022-05-28 ENCOUNTER — Other Ambulatory Visit: Payer: Self-pay | Admitting: Family Medicine

## 2022-05-30 ENCOUNTER — Other Ambulatory Visit (HOSPITAL_COMMUNITY): Payer: Self-pay

## 2022-05-30 ENCOUNTER — Other Ambulatory Visit: Payer: Self-pay

## 2022-05-30 MED ORDER — LOSARTAN POTASSIUM 50 MG PO TABS
50.0000 mg | ORAL_TABLET | Freq: Every day | ORAL | 0 refills | Status: DC
  Filled 2022-05-30: qty 90, 90d supply, fill #0

## 2022-05-30 MED ORDER — ATORVASTATIN CALCIUM 20 MG PO TABS
20.0000 mg | ORAL_TABLET | Freq: Every day | ORAL | 0 refills | Status: DC
  Filled 2022-05-30: qty 90, 90d supply, fill #0

## 2022-06-02 ENCOUNTER — Other Ambulatory Visit
Admission: RE | Admit: 2022-06-02 | Discharge: 2022-06-02 | Disposition: A | Discharge status: Home / Self Care | Payer: Medicaid Other | Source: Ambulatory Visit | Attending: Family Medicine | Admitting: Family Medicine

## 2022-06-02 DIAGNOSIS — E119 Type 2 diabetes mellitus without complications: Secondary | ICD-10-CM | POA: Insufficient documentation

## 2022-06-02 DIAGNOSIS — D511 Vitamin B12 deficiency anemia due to selective vitamin B12 malabsorption with proteinuria: Secondary | ICD-10-CM | POA: Insufficient documentation

## 2022-06-02 DIAGNOSIS — Z0000 Encounter for general adult medical examination without abnormal findings: Secondary | ICD-10-CM | POA: Insufficient documentation

## 2022-06-02 DIAGNOSIS — E559 Vitamin D deficiency, unspecified: Secondary | ICD-10-CM | POA: Insufficient documentation

## 2022-06-02 LAB — URINALYSIS WITH MICROSCOPIC
Blood,UA: NEGATIVE
Glucose,UA: NEGATIVE
Hyaline Casts,UA: NONE SEEN /lpf (ref 0–5)
Ketones, UA: NEGATIVE
Nitrite,UA: NEGATIVE
Specific Gravity,UA: 1.023 (ref 1.002–1.030)
pH,UA: 6.5 (ref 5.0–8.0)

## 2022-06-02 LAB — BASIC METABOLIC PANEL
Anion Gap: 11 (ref 7–16)
CO2: 26 mmol/L (ref 20–28)
Calcium: 9.5 mg/dL (ref 8.6–10.2)
Chloride: 105 mmol/L (ref 96–108)
Creatinine: 0.72 mg/dL (ref 0.51–0.95)
Glucose: 97 mg/dL (ref 60–99)
Lab: 13 mg/dL (ref 6–20)
Potassium: 5 mmol/L (ref 3.3–5.1)
Sodium: 142 mmol/L (ref 133–145)
eGFR BY CREAT: 99 *

## 2022-06-02 LAB — HEPATIC FUNCTION PANEL
ALT: 20 U/L (ref 0–35)
AST: 18 U/L (ref 0–35)
Albumin: 4.4 g/dL (ref 3.5–5.2)
Alk Phos: 126 U/L — ABNORMAL HIGH (ref 35–105)
Bilirubin,Direct: <0.2 mg/dL (ref 0.0–0.3)
Bilirubin,Total: 0.4 mg/dL (ref 0.0–1.2)
Total Protein: 7.5 g/dL (ref 6.3–7.7)

## 2022-06-02 LAB — CBC AND DIFFERENTIAL
Baso # K/uL: 0 10*3/uL (ref 0.0–0.2)
Basophil %: 0.3 %
Eos # K/uL: 0.2 10*3/uL (ref 0.0–0.5)
Eosinophil %: 3.1 %
Hematocrit: 43 % (ref 34–49)
Hemoglobin: 13.6 g/dL (ref 11.2–16.0)
Lymph # K/uL: 2.6 10*3/uL (ref 1.0–5.0)
Lymphocyte %: 36 %
MCH: 27 pg (ref 26–32)
MCHC: 32 g/dL (ref 32–36)
MCV: 86 fL (ref 75–100)
Mono # K/uL: 0.5 10*3/uL (ref 0.1–1.0)
Monocyte %: 7.5 %
Neut # K/uL: 3.8 10*3/uL (ref 1.5–6.5)
Platelets: 281 10*3/uL (ref 150–450)
RBC: 5 MIL/uL (ref 4.0–5.5)
RDW: 13.5 % (ref 0.0–15.0)
Seg Neut %: 53.1 %
WBC: 7.2 10*3/uL (ref 3.5–11.0)

## 2022-06-02 LAB — HEMOGLOBIN A1C: Hemoglobin A1C: 6.2 % — ABNORMAL HIGH

## 2022-06-02 LAB — VITAMIN D: 25-OH Vit Total: 27 ng/mL — ABNORMAL LOW (ref 30–60)

## 2022-06-02 LAB — CORTISOL: CORTISOL,AM: 5.7 ug/dL — ABNORMAL LOW (ref 6.0–18.4)

## 2022-06-02 LAB — LIPID PANEL
Chol/HDL Ratio: 5.6
Cholesterol: 250 mg/dL — AB
HDL: 45 mg/dL (ref 40–60)
LDL Calculated: 175 mg/dL — AB
Non HDL Cholesterol: 205 mg/dL
Triglycerides: 151 mg/dL — AB

## 2022-06-02 LAB — TSH: TSH: 2.24 u[IU]/mL (ref 0.27–4.20)

## 2022-06-02 LAB — VITAMIN B12: Vitamin B12: 462 pg/mL (ref 232–1245)

## 2022-06-02 LAB — T4, FREE: Free T4: 1 ng/dL (ref 0.9–1.7)

## 2022-06-07 ENCOUNTER — Telehealth: Payer: Self-pay | Admitting: Rheumatology

## 2022-06-07 ENCOUNTER — Other Ambulatory Visit: Payer: Self-pay

## 2022-06-07 DIAGNOSIS — L4050 Arthropathic psoriasis, unspecified: Secondary | ICD-10-CM

## 2022-06-07 MED ORDER — HUMIRA (2 PEN) 40 MG/0.4ML SC AJKT
40.0000 mg | AUTO-INJECTOR | SUBCUTANEOUS | 1 refills | Status: DC
Start: 2022-06-07 — End: 2022-09-08
  Filled 2022-06-07 – 2022-06-16 (×2): qty 2, 28d supply, fill #0
  Filled 2022-07-12: qty 2, 28d supply, fill #1
  Filled 2022-08-11: qty 2, 28d supply, fill #2

## 2022-06-08 ENCOUNTER — Other Ambulatory Visit: Payer: Self-pay

## 2022-06-16 ENCOUNTER — Other Ambulatory Visit: Payer: Self-pay

## 2022-06-17 ENCOUNTER — Other Ambulatory Visit: Payer: Self-pay

## 2022-07-12 ENCOUNTER — Other Ambulatory Visit: Payer: Self-pay

## 2022-07-12 ENCOUNTER — Other Ambulatory Visit: Payer: Self-pay | Admitting: Pharmacist

## 2022-07-14 ENCOUNTER — Other Ambulatory Visit: Payer: Self-pay

## 2022-08-11 ENCOUNTER — Encounter: Payer: Self-pay | Admitting: Family Medicine

## 2022-08-11 ENCOUNTER — Other Ambulatory Visit: Payer: Self-pay

## 2022-08-11 NOTE — Telephone Encounter (Signed)
Called patient and patient is unable to come in this afternoon due to being at work, she will do my virtual visit.

## 2022-08-15 ENCOUNTER — Other Ambulatory Visit: Payer: Self-pay

## 2022-08-16 ENCOUNTER — Other Ambulatory Visit: Payer: Self-pay

## 2022-09-05 ENCOUNTER — Other Ambulatory Visit (HOSPITAL_COMMUNITY): Payer: Self-pay

## 2022-09-05 ENCOUNTER — Other Ambulatory Visit: Payer: Self-pay | Admitting: Family Medicine

## 2022-09-05 MED ORDER — LOSARTAN POTASSIUM 50 MG PO TABS
50.0000 mg | ORAL_TABLET | Freq: Every day | ORAL | 0 refills | Status: DC
  Filled 2022-09-05: qty 90, 90d supply, fill #0

## 2022-09-05 MED ORDER — ATORVASTATIN CALCIUM 20 MG PO TABS
20.0000 mg | ORAL_TABLET | Freq: Every day | ORAL | 0 refills | Status: DC
  Filled 2022-09-05: qty 90, 90d supply, fill #0

## 2022-09-06 ENCOUNTER — Other Ambulatory Visit: Payer: Self-pay

## 2022-09-08 ENCOUNTER — Other Ambulatory Visit: Payer: Self-pay | Admitting: Rheumatology

## 2022-09-08 ENCOUNTER — Other Ambulatory Visit: Payer: Self-pay

## 2022-09-08 DIAGNOSIS — L4050 Arthropathic psoriasis, unspecified: Secondary | ICD-10-CM

## 2022-09-08 MED ORDER — HUMIRA (2 PEN) 40 MG/0.4ML SC AJKT
40.0000 mg | AUTO-INJECTOR | SUBCUTANEOUS | 1 refills | Status: DC
Start: 2022-09-08 — End: 2022-10-11
  Filled 2022-09-08: qty 2, 28d supply, fill #0
  Filled 2022-10-05: qty 2, 28d supply, fill #1

## 2022-09-09 ENCOUNTER — Other Ambulatory Visit: Payer: Self-pay

## 2022-09-12 ENCOUNTER — Other Ambulatory Visit: Payer: Self-pay

## 2022-09-12 ENCOUNTER — Other Ambulatory Visit: Payer: Self-pay | Admitting: Pharmacist

## 2022-09-13 ENCOUNTER — Other Ambulatory Visit: Payer: Self-pay

## 2022-09-14 ENCOUNTER — Other Ambulatory Visit: Payer: Self-pay

## 2022-09-20 ENCOUNTER — Other Ambulatory Visit: Payer: Self-pay | Admitting: Gastroenterology

## 2022-09-24 ENCOUNTER — Other Ambulatory Visit
Admission: RE | Admit: 2022-09-24 | Discharge: 2022-09-24 | Disposition: A | Discharge status: Home / Self Care | Payer: Medicaid Other | Source: Ambulatory Visit | Attending: Family | Admitting: Family

## 2022-09-24 DIAGNOSIS — R5383 Other fatigue: Secondary | ICD-10-CM | POA: Insufficient documentation

## 2022-09-24 LAB — LIPID PANEL
Chol/HDL Ratio: 5.9
Cholesterol: 237 mg/dL — AB
HDL: 40 mg/dL (ref 40–60)
LDL Calculated: 167 mg/dL — AB
Non HDL Cholesterol: 197 mg/dL
Triglycerides: 148 mg/dL

## 2022-09-24 LAB — CBC AND DIFFERENTIAL
Baso # K/uL: 0 10*3/uL (ref 0.0–0.2)
Eos # K/uL: 0.3 10*3/uL (ref 0.0–0.5)
Hematocrit: 45 % (ref 34–49)
Hemoglobin: 13.8 g/dL (ref 11.2–16.0)
Lymph # K/uL: 2.4 10*3/uL (ref 1.0–5.0)
MCV: 87 fL (ref 75–100)
Mono # K/uL: 0.5 10*3/uL (ref 0.1–1.0)
Neut # K/uL: 3.3 10*3/uL (ref 1.5–6.5)
Platelets: 279 10*3/uL (ref 150–450)
RBC: 5.2 MIL/uL (ref 4.0–5.5)
RDW: 13.4 % (ref 0.0–15.0)
Seg Neut %: 50.7 %
WBC: 6.5 10*3/uL (ref 3.5–11.0)

## 2022-09-24 LAB — COMPREHENSIVE METABOLIC PANEL
ALT: 61 U/L — ABNORMAL HIGH (ref 0–35)
AST: 37 U/L — ABNORMAL HIGH (ref 0–35)
Albumin: 4.4 g/dL (ref 3.5–5.2)
Alk Phos: 112 U/L — ABNORMAL HIGH (ref 35–105)
Anion Gap: 14 (ref 7–16)
Bilirubin,Total: 0.4 mg/dL (ref 0.0–1.2)
CO2: 24 mmol/L (ref 20–28)
Calcium: 9.5 mg/dL (ref 8.6–10.2)
Chloride: 105 mmol/L (ref 96–108)
Creatinine: 0.67 mg/dL (ref 0.51–0.95)
Glucose: 116 mg/dL — ABNORMAL HIGH (ref 60–99)
Lab: 13 mg/dL (ref 6–20)
Potassium: 4.4 mmol/L (ref 3.3–5.1)
Sodium: 143 mmol/L (ref 133–145)
Total Protein: 7.7 g/dL (ref 6.3–7.7)
eGFR BY CREAT: 103 *

## 2022-09-24 LAB — T4, FREE: Free T4: 1 ng/dL (ref 0.9–1.7)

## 2022-09-24 LAB — TSH: TSH: 2.01 u[IU]/mL (ref 0.27–4.20)

## 2022-09-24 LAB — VITAMIN B12: Vitamin B12: 1036 pg/mL (ref 232–1245)

## 2022-09-24 LAB — FOLATE: Folate: 8.4 ng/mL (ref >=4.6)

## 2022-09-24 LAB — VITAMIN D: 25-OH Vit Total: 40 ng/mL (ref 30–60)

## 2022-09-25 LAB — HEMOGLOBIN A1C: Hemoglobin A1C: 6.4 % — ABNORMAL HIGH

## 2022-09-27 ENCOUNTER — Other Ambulatory Visit: Payer: Self-pay

## 2022-09-27 ENCOUNTER — Ambulatory Visit: Payer: Medicaid Other | Attending: Immunology | Admitting: Rheumatology

## 2022-09-27 ENCOUNTER — Ambulatory Visit: Payer: Medicaid Other | Admitting: Dermatology

## 2022-09-27 VITALS — Wt 257.0 lb

## 2022-09-27 DIAGNOSIS — L4050 Arthropathic psoriasis, unspecified: Secondary | ICD-10-CM

## 2022-09-27 DIAGNOSIS — L409 Psoriasis, unspecified: Secondary | ICD-10-CM

## 2022-09-27 DIAGNOSIS — Z5181 Encounter for therapeutic drug level monitoring: Secondary | ICD-10-CM

## 2022-09-27 MED ORDER — CALCIPOTRIENE 0.005 % EX OINT *A*
TOPICAL_OINTMENT | Freq: Two times a day (BID) | CUTANEOUS | 2 refills | Status: AC
Start: 2022-09-27 — End: ?

## 2022-09-27 MED ORDER — TACROLIMUS 0.1 % EX OINT *I*
TOPICAL_OINTMENT | Freq: Two times a day (BID) | CUTANEOUS | 6 refills | Status: DC | PRN
Start: 2022-09-27 — End: 2023-12-27

## 2022-09-27 MED ORDER — CLOBETASOL PROPIONATE 0.05 % EX SOLN *I*
Freq: Two times a day (BID) | CUTANEOUS | 2 refills | Status: AC | PRN
Start: 2022-09-27 — End: ?

## 2022-09-27 MED ORDER — BETAMETHASONE DIPROPIONATE AUG 0.05 % EX OINT *A*
TOPICAL_OINTMENT | CUTANEOUS | 1 refills | Status: AC
Start: 2022-09-27 — End: ?

## 2022-09-27 NOTE — Progress Notes (Addendum)
Rheumatology Follow-Up Note     PRIMARY CARE PHYSICIAN: Yolanda Manges, MD    RHEUMATOLOGY SNAPSHOT: No specialty comments available.        Subjective   HPI:   Loretta [name] is a 57 y.o. female with PMH diverticulitis, seasonal allergic rhinitis, PsA who is here for follow up of PsA.      Following her visit on 03/15/2022, plan was to initiate treatment with Stelara. Unfortunately, insurance denied this medication. Thus, Humira was initiated as an alternative. At this time, she has been on Humira for about 6 months. Unfortunately she has not noticed any improvement whatsoever in her joint symptoms since starting this medication. Tops of the feet by the toes and the middle knuckles continue to be quite painful. Also expresses pain in the knees. Pain and stiffness of the joints over synovitis at this time. Some morning stiffness and stiffness with sitting for about 30 minutes. Stiffness at the end of the day is generally worse compared to in the morning. She is not using any NSAIDs, acetaminophen, or topical analgesics at this time for the joints.     In regards to the skin, she has noticed some improvement in the [facility] of the legs, but none of the scalp or posterior neck. See dermatology documentation for further details.     Denied recent infections or illnesses. No injection site reactions with Humira.    In regards to other aspects of her health, she was started on blood pressure medication (amlodipine) for new HTN. She inquired about Humira as the cause of her rising BP. She was also started on phentermine for weight loss. Recent labs revealed some elevations in LFTs which she expresses concern about.                  Current Meds:     Current Outpatient Medications   Medication Sig    amLODIPine 5 mg tablet Take 1 tablet (5 mg total) by mouth daily.    phentermine (IONAMIN) 15 MG capsule Take 1 capsule (15 mg total) by mouth every morning.    valACYclovir (VALTREX) 1 gm tablet Take 1 tablet (1,000 mg total) by  mouth daily.    betamethasone diprop augmented (DIPROLENE-AF) 0.05 % ointment Apply to affected areas on body two times daily as needed for itch or rash.   Do not apply to face, underarms, or groin    calcipotriene (DOVONOX) 0.005 % ointment Apply topically 2 times daily. Apply to red scaly areas on the body two times daily.    tacrolimus (PROTOPIC) 0.1 % ointment Apply topically 2 times daily as needed. to the following areas: face.    clobetasol (TEMOVATE) 0.05 % external solution Apply topically 2 times daily as needed. to the following areas: ears.  DO NOT use on face, armpits or groin.    adalimumab (HUMIRA, 2 PEN,) 40 mg/0.4 mL pen Inject 0.4 mLs (40 mg total) into the skin every 14 days    betamethasone dipropionate (DIPROSONE) 0.05 % ointment Apply topically 2 times daily as needed for Rash  to the following areas: red, scaly areas on the body. Do not use on face, underarms, groin.    traZODone (DESYREL) 50 MG tablet Take 100 mg by mouth nightly as needed                           REVIEW of SYSTEMS:       Constitutional: No unexplained fever, no diaphoresis,  no decreased appetite, no weight loss and no fatigue.    Musculoskeletal: See HPI for details. Joint pain, joint stiffness and morning stiffness. No joint swelling. lasting about 1-2 hours.    Skin: Rash. No nail changes.    Eyes: Negative. No eye redness, no pain and no eye dryness.    ENT: Negative. No dry mouth and no mouth sores.    Respiratory: Negative. No shortness of breath.    Cardiac: Negative. No chest pain and no palpitations.    Gastrointestinal: No difficulty swallowing, no nausea, no vomiting, no diarrhea and no constipation.    Genitourinary: Negative.    Endocrine: Negative.    Allergic/Immunologic: Negative.   Hematologic: Negative.   Neurologic: Negative.    Psychiatric: Negative.            Objective    PHYSICAL EXAMINATION:  Wt 116.6 kg (257 lb)   BMI 44.81 kg/m   Body mass index is 44.81 kg/m.  There were no vitals filed for this  visit.    Constitutional:    Well-developed and well-nourished.     Overweight or obese.    Eyes:    Pupils are equal, round, and reactive, EOM are normal and conjunctivae appear normal.     HENT:   Normocephalic and atraumatic.      There are no oral lesions and the salivary pool is normal.    Neck:    Normal range of motion and neck supple.    Lymph:   No adenopathy.    Cardiovascular:    Normal rate and regular rhythm.    Pulmonary:    Effort is normal.    Abdominal:    The abdomen is soft.     There is no abdominal tenderness.   Skin:    Skin is warm and dry.     She has erythema and a rash. She has normal nails, no malar rash and no livedo reticularis.     Plaques on neck, and legs bilaterally.   Neuro:    She is alert and is oriented.    There is normal coordination.    Psych:    There is a normal mood and affect and normal attention/engagement.     Musculoskeletal Exam:   Upper and lower extremities  There is peripheral joint tenderness. There is no peripheral joint swelling, no peripheral joint deformity and no abnormal or restricted ROM. TTP belly of bilateral achilles tendons  TTP R lateral epicondyle of elbow   No TTP of plantar fascia insertions   Axial exam  There is no spinal tenderness.   MSK Comments  Tenderness in PIPs and dorsal foot         Jump to joint exam  Joint Exam 09/27/2022        Right  Left   Elbow          Comments  TTP over lateral epicondyle      MCP 1      Tender   MCP 4      Tender   PIP 2 (finger)   Tender   Tender   PIP 3 (finger)   Tender   Tender   PIP 4 (finger)   Tender   Tender   PIP 5 (finger)   Tender   Tender   Knee   Tender   Tender   Ankle   Tender   Tender   MTP 2   Tender   Tender   MTP 3  Tender   Tender   MTP 4   Tender   Tender   MTP 5   Tender   Tender                 PRIOR STUDIES:  Labs:     - Recent labs reviewed -- please see ERecord results review section for values.  -  DMARD monitoring labs        Lab results: 09/24/22  0859 06/02/22  0939 01/05/22  1352    WBC 6.5 7.2  --    Hemoglobin 13.8 13.6  --    Hematocrit 45 43  --    Platelets 279 281  --    MCV 87 86  --    Seg Neut % 50.7 53.1  --    Lymphocyte %  --  36.0  --    Monocyte %  --  7.5  --    Eosinophil %  --  3.1  --    Basophil %  --  0.3  --    Creatinine 0.67 0.72  --    Total Protein 7.7 7.5  --    Albumin 4.4 4.4  --    Bilirubin,Total 0.4 0.4  --    Bilirubin,Direct  --  <0.2  --    AST 37* 18  --    ALT 61* 20  --    Alk Phos 112* 126*  --    Sedimentation Rate  --   --  32*   CRP  --   --  6   25-OH Vit Total 40 27*  --       - Inflammatory markers etc -       Lab results: 01/05/22  1352   Sedimentation Rate 32*   CRP 6     - Rheum serologies        Lab results: 01/05/22  1352   ANA Screen NEG   Rheumatoid Factor <8   Cyclic Citrullin Peptide Ab <1.6        Radiology:       -Recent imaging reports reviewed -- please see ERecord imaging section for details.  01/12/2022 11:49 AM    BILATERAL STANDING KNEE X-RAY    CLINICAL INFORMATION:  joint pain, L40.9-Psoriasis, unspecified.    COMPARISON: 05/08/2019.    PROCEDURE: AP projection of bilateral standing knee were obtained.    FINDINGS/   Impression     Moderate narrowing of the right medial compartment with mild subchondral sclerosis. The joint spaces on the left appear age-appropriate.    END OF IMPRESSION     01/12/2022 11:49 AM    BILATERAL HIP X-RAYS    CLINICAL INFORMATION:  joint pain, L40.9-Psoriasis, unspecified.    COMPARISON: 11/03/2019    PROCEDURE: AP projection of the pelvis and 2 projections of the right hip and 2 projections of the left hip were obtained.    FINDINGS/   Impression     The hip joints appear age appropriate and unchanged from prior.    END OF IMPRESSION      01/12/2022 11:48 AM     BILATERAL HAND X-RAY     CLINICAL INFORMATION:  joint pain, L40.9-Psoriasis, unspecified.     COMPARISON:  None.     PROCEDURE:  A single projection of the bilateral hands was obtained.     IMPRESSION/FINDINGS:      The bones disclose adequate  mineralization and trabecular pattern. No joint space loss, erosion, or periostitis noted.  END OF IMPRESSION       Assessment    IMPRESSION:       ICD-10-CM ICD-9-CM    1. Psoriatic arthritis  L40.50 696.0       2. Psoriasis  L40.9 696.1             Loretta [name] is a 57 y.o. female with PMH diverticulitis, seasonal allergic rhinitis, PsA who is here for follow up of PsA. At her last visit she was started on Humira for treatment of PsA and PsO after Stelara was denied by insurance. Unfortunately, she has noticed no difference in her joint symptoms since initiating Humira for treatment, and continues to express pain and stiffness of numerous joints during her visit today. Additionally, her skin disease continues to be quite active, with psoriatic plaques appreciated on the neck, shins, and scalp during her examination today. Thus, will try once more for Stelara for treatment. New PA to be placed today.    Information on one of our ongoing clinical studies regarding plant based diet for treatment was given to the patient today to review. Encouraged enrollment in this as we believe she should benefit greatly from the education and lifestyle recommendations provided through this study.     In regards to other aspects of her health, we reviewed her medication list today and expressed concern regarding her use of phentermine in the setting of her rising blood pressures. Thus, We recommended discontinuing use of phentermine. Her PCP will be informed of this recommendation.         # PsA  - Negative ANA, RF, CCP  - ESR 32, CRP 6  - MSK [place] right hand/wrist/forefoot Esmond Harps 12/2021: No inflammatory changes  - Xray hips 12/2021:: no erosion or periostitis  - Xray feet: Joint spaces maintained, small plantar and retrocalcaneal spur  - Plan: PA for stelara           AIR Attending    I saw and evaluated the patient. I agree with Rosanne Gutting findings and plan of care as documented. Stelara was denied and we are appealing  that denial. We are concerned as is Dr. Jiles Garter, about elevated blood pressure. We recommended that she discuss the use of phenetamine with her primary given the persistent elevation of her BP.      Marcelline Deist, MD MPH      Marcelline Deist, MD MPH

## 2022-09-27 NOTE — Telephone Encounter (Signed)
Prior authorization for biologic agents     Please initiate prior authorization for Stelara  This is a: New prescription; first biologic  Details of dose or dose form or dose route: SUBQ: 90 mg at 0 and 4 weeks, and then 90 mg every 12 weeks thereafter    Treatment diagnosis:   Psoriatic arthritis       Relevant history:   Concurrent medications for this condition: Humira - no improvement   Prior biologics:  None   Other relevant prior medications: Topical steroids, voltaren, MTX contraindicated    TB screening if relevant:  Monitoring for use of biologics: @RHUTBSCREEN @           TB Ag results:   Lab Results   Component Value Date    2GBT NEGATIVE 01/05/2022     Other testing:     A consent form has been signed by the patient:  No and N/A    Risks and benefits of using this medication have been discussed with the patient, including any specific risks related to the use of biologics or infusion medications.  She has had a chance to ask questions which were answered to her satisfaction, and she has verbally consented to the use of this medication.      Valaria Good, PA   11:55 AM   09/27/2022

## 2022-09-27 NOTE — Progress Notes (Addendum)
Dermatology Progress Note  Chief Complaint   Patient presents with    Follow-up     PSA      Derm History:   - previously followed with Dr. Neva Seat  Psoriasis: for ~22 years   - prior tx: ILK, clobetasol with some improvement   - insurance denied taclonex  - 01/05/2022: current tx calcipotriene, betamethasone,     Relevant Medical History:  - history of diverticulitis, but no personal or family hx of IBD  ?  HPI:   Loretta [name] is a pleasant 57 y.o. female here for the concern noted below.  ?  Last Visit 03/15/2022 PsA clinic    Today:   PsA/Psoriasis  -started on Humira in 03/2022 (Stelara denied). MCPs, dorsal feet, knees, low back, mildly in hips-winter is good season and summer is her bad season for joint pain.   Joint pain stays constant throughout the day as well as finger swelling.   Legs and face have responded to Humira but didn't clear up neck, or ears.    Reports issues with BP.  Doctor started her on Amlodipine and is on phentermine for weight loss.  Has run in 200s/100s, denies HA or blurry vision.     Family Hx: son has psoriasis  Social Hx:  Works as a Surveyor, mining.     Physical Exam:   Vitals:    09/27/22 1017   Weight: 116.6 kg (257 lb)     Skin: All of the following were examined, and were within normal limits, except as noted: Face, Scalp/Hair, Extremities (RUE/LUE) and Extremities (RLE/LLE)  -Well demarcated, red, scaly plaques on posterior neck, b/l external ear canal and forehead.    Labs:       Basic Metabolic Panel CBC          Lab results: 09/24/22  0859   Sodium 143   Potassium 4.4   Chloride 105   CO2 24   UN 13   Creatinine 0.67   Glucose 116*   Calcium 9.5   Total Protein 7.7   Albumin 4.4   ALT 61*   AST 37*   Alk Phos 112*   Bilirubin,Total 0.4               Lab results: 09/24/22  0859 06/02/22  0939   WBC 6.5 7.2   Hemoglobin 13.8 13.6   Hematocrit 45 43   RBC 5.2 5.0   Platelets 279 281   Neut # K/uL 3.3 3.8   Lymph # K/uL 2.4 2.6   Mono # K/uL 0.5 0.5   Eos # K/uL 0.3 0.2    Baso # K/uL 0.0 0.0   Seg Neut % 50.7 53.1   Lymphocyte %  --  36.0   Monocyte %  --  7.5   Eosinophil %  --  3.1   Basophil %  --  0.3           Liver Panel Other Labs          Lab results: 09/24/22  0859 06/02/22  0939   Total Protein 7.7 7.5   Albumin 4.4 4.4   ALT 61* 20   AST 37* 18   Alk Phos 112* 126*   Bilirubin,Total 0.4 0.4   Bilirubin,Direct  --  <0.2           Lab Results   Component Value Date    HA1C 6.4 (H) 09/24/2022     Lab Results   Component  Value Date    TSH 2.01 09/24/2022      Free T4   Date Value Ref Range Status   09/24/2022 1.0 0.9 - 1.7 ng/dL Final       No results found for: "FE", "IBC", "FESAT"      Lab results: 09/24/22  0859   25-OH Vit Total 40            Lab results: 09/24/22  0859   Cholesterol 237*   HDL 40   LDL Calculated 167*   Triglycerides 148   Chol/HDL Ratio 5.9     No components found with this basename: "[employer]"    Lab Results   Component Value Date    ANAS NEG 01/05/2022    RFD <8 01/05/2022    ESR 32 (H) 01/05/2022          Micro results        No results found for: "HAAB", "HAIGM", "HCOR", "HBEAG", "HHCV"     No results found for: "HV12T", "FHV12"     Latest Reference Range & Units 01/05/22 13:52   TB AG T-Cell Stim IU/mL NEGATIVE              Hand bilateral single view  01/12/2022 11:48 AM    BILATERAL HAND X-RAY    CLINICAL INFORMATION:  joint pain, L40.9-Psoriasis, unspecified.    COMPARISON:  None.    PROCEDURE:  A single projection of the bilateral hands was obtained.    IMPRESSION/FINDINGS:      The bones disclose adequate mineralization and trabecular pattern. No joint space loss, erosion, or periostitis noted.    END OF IMPRESSION              [facility] submits this DICOM format image data and final report to the [facility], an independent secure electronic health information exchange, on a reciprocally searchable basis (with patient authorization) for a minimum of 12 months after exam   date.  Knee bilateral  AP standing  Narrative: 01/12/2022 11:49  AM    BILATERAL STANDING KNEE X-RAY    CLINICAL INFORMATION:  joint pain, L40.9-Psoriasis, unspecified.    COMPARISON: 05/08/2019.    PROCEDURE: AP projection of bilateral standing knee were obtained.    FINDINGS/  Impression: Moderate narrowing of the right medial compartment with mild subchondral sclerosis. The joint spaces on the left appear age-appropriate.    END OF IMPRESSION              [facility] submits this DICOM format image data and final report to the [facility], an independent secure electronic health information exchange, on a reciprocally searchable basis (with patient authorization) for a minimum of 12 months after exam   date.  Hips bilateral AP pelvis and frog leg lateral views  Narrative: 01/12/2022 11:49 AM    BILATERAL HIP X-RAYS    CLINICAL INFORMATION:  joint pain, L40.9-Psoriasis, unspecified.    COMPARISON: 11/03/2019    PROCEDURE: AP projection of the pelvis and 2 projections of the right hip and 2 projections of the left hip were obtained.    FINDINGS/  Impression: The hip joints appear age appropriate and unchanged from prior.    END OF IMPRESSION              [facility] submits this DICOM format image data and final report to the [facility], an independent secure electronic health information exchange, on a reciprocally searchable basis (with patient authorization) for a minimum of 12 months after  exam   date.      Assessment/Plan:    Psoriasis with PSA: psoriasis with 5% BSA coverage, chronic, not at treatment goal, flaring today. Failed Adalimumab and Class I topical steroids.     PLAN:   - Pending PA approval, patient to start Ustekinumab (Stelara: IL-23 Inhibitor) per Rheumatology     - TB-Quant: Negative, due 12/2022.    TOPICALS:  -For flares on face: start protopic (tacrolimus) 0.1% ointment, apply to affected areas 2-3x per day  - side effects including burning upon application discussed.  This will stop with continued use.  -For flares in ears: start clobetasol 0.05%  external solution BID prn.  DO NOT use on face, armpits or groin.   -For flares on body: start calcipotriene 0.005% and betamethasone dipropionate augmented 0.05% ointments twice daily as needed.   - Do not use on face, underarms, or groin   - Side effects: lightening or thinning of the skin, especially if used on areas where you don't have rash   For maintenance of any stubborn body areas that flare consistently: use calcipotriene 0.005% ointment BID Monday-Friday and betamethasone diprop aug 0.05% 1-2x daily on Saturday and Sunday.     Medication Monitoring  -Yearly CBC, CMP and TB.    --TB due 12/2022  --Continuing monitoring transaminitis with PCP     Hypertension, uncontrolled  -recommend discontinuing phentermine as this can contribute to raising BP.   -continue home monitoring of BP and amlodipine per PCP.   -recommend discussing BP management with PCP.  Rheum will send message to Dr. Jiles Garter.   -seek medical attention for sxs including worsening HAs, blurry vision or feeling faint.     RTC: 24mo for PSA     This patient was seen and examined with Dr. Consepcion Hearing. We collaborated with history taking and the physical exam. Dr. Consepcion Hearing completed the medical decision making.   Loretta Boss, NP  09/27/22  3:46 PM

## 2022-10-03 ENCOUNTER — Encounter: Payer: Self-pay | Admitting: Rheumatology

## 2022-10-05 ENCOUNTER — Other Ambulatory Visit: Payer: Self-pay

## 2022-10-06 ENCOUNTER — Other Ambulatory Visit: Payer: Self-pay

## 2022-10-10 ENCOUNTER — Other Ambulatory Visit: Payer: Self-pay

## 2022-10-11 ENCOUNTER — Other Ambulatory Visit: Payer: Self-pay

## 2022-10-11 ENCOUNTER — Other Ambulatory Visit: Payer: Self-pay | Admitting: Pharmacist

## 2022-10-11 MED ORDER — STELARA 90 MG/ML SC SOSY
90.0000 mg | PREFILLED_SYRINGE | Freq: Once | SUBCUTANEOUS | 2 refills | Status: DC
Start: 2022-10-11 — End: 2023-04-11
  Filled 2022-10-11: qty 1, 28d supply, fill #0
  Filled 2022-11-02: qty 1, 84d supply, fill #1
  Filled 2023-01-27: qty 1, 84d supply, fill #2

## 2022-10-11 NOTE — Patient Instructions (Signed)
June 2024      Sunday Monday Tuesday Wednesday Thursday Friday Saturday                                 1       2     3     4   5     6     7     8       9     10     11     12     13     14     15       16     17     18     19     20     21     22       23     24 Stelara  Week 0   25     26     27     28     29       22 November 2022      Sunday Monday Tuesday Wednesday Thursday Friday Saturday        1     2     3     4     5     6       7     8     9     10     11     12     13       14     15     16     17     18     19     20       21     22 Stelara  Week 4   23     24     25     26     27       28     29     30     31           Every 12 weeks hereafter, next dose ~02/06/23

## 2022-10-11 NOTE — Progress Notes (Signed)
Spoke with Loretta [name] regarding new prescription for Ustekinumab Prefilled syringe for PsA.    Initial assessment included review of interdisciplinary team documentation on physical health, social, environmental, economic, functional and mental components relevant to the patient's ability to adhere to the care plan with [facility] [employer].    Discussed change from Humira to Stelara including similarities and differences regarding administration, treatment goals, duration of therapy, adherence strategies, side effects, safety precautions, contraindications, missed dose management, handling, storage and disposal. Considering no difficulties administering Stelara for son in the past, she had no concerns regarding self-administration with the prefilled syringe. Briefly reviewed administration over the phone with print instructions to be included for reference.     Baseline testing: Tb Completed on 01/05/22 as Negative  Lab Results   Component Value Date    SAB NEG 03/15/2022    CORZ NEG 03/15/2022    HCV NEG 03/15/2022     Administration instructions: Reviewed loading dosing of 1 injection (90 mg) at week 0 and week 4, followed by maintenance dosing every 12 weeks thereafter.  Pharmacy will provide print instructions and dosing calendar with medication as well for reference.   Planned Start Date: Next Humira dose originally due 10/17/22, thus planning to start Stelara instead next week.   Adherence plan: Encouraged use of calendar to track administration  Potential side effects discussed: Injection site reactions and immune suppression.   Lab and monitoring schedule: Last 09/24/22  Specialty Pharmacy: [facility]; Delivery Date: 10/14/22; Copay: $3 (waived)    Medication list reviewed; list is up to date. No significant interactions identified.  Current Outpatient Medications   Medication Sig    ustekinumab (STELARA) 90 MG/ML prefilled syringe Inject 1 mL (90 mg total) into the skin at  week 0 and week 4, then every 12 weeks thereafter    amLODIPine 5 mg tablet Take 1 tablet (5 mg total) by mouth daily.    phentermine (IONAMIN) 15 MG capsule Take 1 capsule (15 mg total) by mouth every morning.    valACYclovir (VALTREX) 1 gm tablet Take 1 tablet (1,000 mg total) by mouth daily.    betamethasone diprop augmented (DIPROLENE-AF) 0.05 % ointment Apply to affected areas on body two times daily as needed for itch or rash.   Do not apply to face, underarms, or groin    calcipotriene (DOVONOX) 0.005 % ointment Apply topically 2 times daily. Apply to red scaly areas on the body two times daily.    tacrolimus (PROTOPIC) 0.1 % ointment Apply topically 2 times daily as needed. to the following areas: face.    clobetasol (TEMOVATE) 0.05 % external solution Apply topically 2 times daily as needed. to the following areas: ears.  DO NOT use on face, armpits or groin.     Plan to follow up with patient in approximately 3 weeks, however encouraged her to call with any questions or concerns.    Thank you for allowing me to participate in the care of this patient. Please contact me with any questions.    Thank you,  Mack Guise, PharmD  [facility]  Phone: [phone]  Fax: [phone]

## 2022-10-11 NOTE — Addendum Note (Signed)
Addended by: Tamala Fothergill on: 10/11/2022 09:04 AM     Modules accepted: Orders

## 2022-10-20 ENCOUNTER — Other Ambulatory Visit: Payer: Self-pay

## 2022-10-21 ENCOUNTER — Encounter: Payer: Self-pay | Admitting: Family Medicine

## 2022-10-21 ENCOUNTER — Ambulatory Visit (INDEPENDENT_AMBULATORY_CARE_PROVIDER_SITE_OTHER): Payer: 59 | Admitting: Family Medicine

## 2022-10-21 VITALS — BP 142/88 | HR 142 | Temp 102.0°F | Ht 65.0 in | Wt 202.0 lb

## 2022-10-21 DIAGNOSIS — R509 Fever, unspecified: Secondary | ICD-10-CM | POA: Insufficient documentation

## 2022-10-21 DIAGNOSIS — J018 Other acute sinusitis: Secondary | ICD-10-CM

## 2022-10-21 LAB — CBC WITH DIFFERENTIAL/PLATELET
Basophils Absolute: 0.1 10*3/uL (ref 0.0–0.2)
Basos: 0 %
EOS (ABSOLUTE): 0 10*3/uL (ref 0.0–0.4)
Eos: 0 %
Hematocrit: 39.9 % (ref 34.0–46.6)
Hemoglobin: 12.6 g/dL (ref 11.1–15.9)
Immature Grans (Abs): 0.1 10*3/uL (ref 0.0–0.1)
Immature Granulocytes: 1 %
Lymphocytes Absolute: 1.4 10*3/uL (ref 0.7–3.1)
Lymphs: 7 %
MCH: 26.4 pg — ABNORMAL LOW (ref 26.6–33.0)
MCHC: 31.6 g/dL (ref 31.5–35.7)
MCV: 84 fL (ref 79–97)
Monocytes Absolute: 1.3 10*3/uL — ABNORMAL HIGH (ref 0.1–0.9)
Monocytes: 7 %
Neutrophils Absolute: 16.3 10*3/uL — ABNORMAL HIGH (ref 1.4–7.0)
Neutrophils: 85 %
Platelets: 243 10*3/uL (ref 150–450)
RBC: 4.77 x10E6/uL (ref 3.77–5.28)
RDW: 13.7 % (ref 11.7–15.4)
WBC: 19.2 10*3/uL — ABNORMAL HIGH (ref 3.4–10.8)

## 2022-10-21 LAB — COMPREHENSIVE METABOLIC PANEL
ALT: 18 IU/L (ref 0–32)
AST: 23 IU/L (ref 0–40)
Albumin: 4.5 g/dL (ref 3.8–4.9)
Alkaline Phosphatase: 119 IU/L (ref 44–121)
BUN/Creatinine Ratio: 10 (ref 9–23)
BUN: 11 mg/dL (ref 6–24)
Bilirubin Total: 0.8 mg/dL (ref 0.0–1.2)
CO2: 21 mmol/L (ref 20–29)
Calcium: 9.1 mg/dL (ref 8.7–10.2)
Chloride: 97 mmol/L (ref 96–106)
Creatinine, Ser: 1.07 mg/dL — ABNORMAL HIGH (ref 0.57–1.00)
Globulin, Total: 2.5 g/dL (ref 1.5–4.5)
Glucose: 139 mg/dL — ABNORMAL HIGH (ref 70–99)
Potassium: 3.9 mmol/L (ref 3.5–5.2)
Sodium: 136 mmol/L (ref 134–144)
Total Protein: 7 g/dL (ref 6.0–8.5)
eGFR: 61 mL/min/{1.73_m2} (ref >59)

## 2022-10-21 LAB — POCT RAPID STREP A (OFFICE): Rapid Strep A Screen: NEGATIVE

## 2022-10-21 LAB — POCT INFLUENZA A/B
Influenza A, POC: NEGATIVE
Influenza B, POC: NEGATIVE

## 2022-10-21 LAB — POC COVID19 BINAXNOW: SARS Coronavirus 2 Ag: NEGATIVE

## 2022-10-21 MED ORDER — DOXYCYCLINE HYCLATE 100 MG PO TABS: 100.0000 mg | ORAL_TABLET | Freq: Two times a day (BID) | ORAL | 0 refills | Status: DC

## 2022-10-21 NOTE — Progress Notes (Unsigned)
Acute Office Visit  Subjective:    Patient ID: Paige Hardy, female    DOB: 1965/05/07, 57 y.o.   MRN: [id number]  Chief Complaint  Patient presents with   Fever     Patient is in today for fever. Patient states her symptoms started this past Tuesday with sore throat and drainage. She then states that starting yesterday she started running a fever of 101. Patient has no appetite and has body aches too as well. Patient denies Chest pain/Shortness of breath/wheezing.    Past Medical History:  Diagnosis Date   Essential hypertension    Mixed hyperlipidemia    Vitamin D deficiency     Past Surgical History:  Procedure Laterality Date   ANKLE ARTHROSCOPY Right 04/15/2022   Procedure: RIGHT ANKLE ARTHROSCOPY;  Surgeon: [name], MD;  Location: [facility] OR;  Service: Orthopedics;  Laterality: Right;   ANKLE FRACTURE SURGERY Right    CHOLECYSTECTOMY     HARDWARE REMOVAL Right 04/15/2022   Procedure: HARDWARE REMOVAL RIGHT ANKLE;  Surgeon: [name], MD;  Location: [facility] OR;  Service: Orthopedics;  Laterality: Right;    Family History  Problem Relation Age of Onset   Hypertension Other    Prostate cancer Father    Hypertension Maternal Grandfather    Diabetes Maternal Grandfather     Social History   Socioeconomic History   Marital status: Married    Spouse name: Not on file   Number of children: Not on file   Years of education: Not on file   Highest education level: Not on file  Occupational History   Not on file  Tobacco Use   Smoking status: Never   Smokeless tobacco: Never  Vaping Use   Vaping Use: Never used  Substance and Sexual Activity   Alcohol use: Yes    Comment: socially   Drug use: Never   Sexual activity: Not on file  Other Topics Concern   Not on file  Social History Narrative   Not on file   Social Determinants of Health   Financial Resource Strain: Not on file  Food Insecurity: Not on file  Transportation Needs: Not on file   Physical Activity: Not on file  Stress: Not on file  Social Connections: Not on file  Intimate Partner Violence: Not on file    Outpatient Medications Prior to Visit  Medication Sig Dispense Refill   Ascorbic Acid (VITAMIN C) 1000 MG tablet Take 1,000 mg by mouth daily.     atorvastatin (LIPITOR) 20 MG tablet Take 1 tablet (20 mg total) by mouth daily. Patient needs an appointment 90 tablet 0   diphenhydrAMINE (BENADRYL) 25 MG tablet Take 12.5 mg by mouth every 6 (six) hours as needed.     losartan (COZAAR) 50 MG tablet Take 1 tablet (50 mg total) by mouth daily. Patient needs an appointment 90 tablet 0   Multiple Vitamins-Minerals (MULTIVITAMIN ADULT) CHEW Chew 2 each by mouth daily.     valACYclovir (VALTREX) 500 MG tablet Take 1 tablet (500 mg total) by mouth 2 (two) times daily. (Patient not taking: Reported on 04/13/2022) 20 tablet 2   diclofenac Sodium (VOLTAREN) 1 % GEL Apply 4 grams topically 4 (four) times daily. (Patient taking differently: Apply 4 g topically 2 (two) times daily as needed (pain).) 50 g 1   HYDROcodone-acetaminophen (NORCO/VICODIN) 5-325 MG tablet Take 1 tablet by mouth every 4 (four) hours as needed. 30 tablet 0   HYDROcodone-acetaminophen (NORCO/VICODIN) 5-325 MG tablet Take  1 tablet by mouth every 4 (four) hours as needed. 30 tablet 0   No facility-administered medications prior to visit.    No Known Allergies  Review of Systems  Constitutional:  Positive for fever. Negative for appetite change and fatigue.  HENT:  Positive for congestion, rhinorrhea and sore throat (for 1-2 days). Negative for ear pain and sinus pressure.   Respiratory:  Positive for cough. Negative for chest tightness, shortness of breath and wheezing.   Cardiovascular:  Negative for chest pain and palpitations.  Gastrointestinal:  Negative for abdominal pain, constipation, diarrhea, nausea and vomiting.  Genitourinary:  Negative for dysuria and hematuria.  Musculoskeletal:  Positive  for myalgias (body aches). Negative for arthralgias, back pain and joint swelling.  Skin:  Negative for rash.  Neurological:  Negative for dizziness, weakness and headaches.  Psychiatric/Behavioral:  Negative for dysphoric mood. The patient is not nervous/anxious.        Objective:    Physical Exam Vitals reviewed.  Constitutional:      Appearance: Normal appearance. She is normal weight.  HENT:     Right Ear: Tympanic membrane, ear canal and external ear normal.     Left Ear: Tympanic membrane and external ear normal.     Nose: Nose normal.     Mouth/Throat:     Pharynx: Posterior oropharyngeal erythema present.  Cardiovascular:     Rate and Rhythm: Normal rate and regular rhythm.     Pulses: Normal pulses.     Heart sounds: Normal heart sounds. No murmur heard. Pulmonary:     Effort: Pulmonary effort is normal. No respiratory distress.     Breath sounds: Normal breath sounds.  Abdominal:     General: Abdomen is flat. Bowel sounds are normal.     Palpations: Abdomen is soft.     Tenderness: There is no abdominal tenderness.  Neurological:     Mental Status: She is alert and oriented to person, place, and time.  Psychiatric:        Mood and Affect: Mood normal.        Behavior: Behavior normal.     BP (!) 142/88 (BP Location: Left Arm, Patient Position: Sitting)   Pulse (!) 142   Temp (!) 102 F (38.9 C) (Temporal)   Ht 5\' 5"  (1.651 m)   Wt 202 lb (91.6 kg)   SpO2 95%   BMI 33.61 kg/m  Wt Readings from Last 3 Encounters:  10/21/22 202 lb (91.6 kg)  04/15/22 195 lb (88.5 kg)  02/23/22 205 lb (93 kg)    Health Maintenance Due  Topic Date Due   HIV Screening  Never done   Hepatitis C Screening  Never done   Zoster Vaccines- Shingrix (1 of 2) Never done   COVID-19 Vaccine (3 - 2023-24 season) 12/24/2021   MAMMOGRAM  05/26/2022    There are no preventive care reminders to display for this patient.   Lab Results  Component Value Date   TSH 2.790 05/25/2020    Lab Results  Component Value Date   WBC 8.7 04/15/2022   HGB 12.9 04/15/2022   HCT 40.7 04/15/2022   MCV 85.7 04/15/2022   PLT 291 04/15/2022   Lab Results  Component Value Date   NA 140 04/15/2022   K 3.7 04/15/2022   CO2 28 04/15/2022   GLUCOSE 107 (H) 04/15/2022   BUN 11 04/15/2022   CREATININE 1.06 (H) 04/15/2022   BILITOT 0.4 12/07/2020   ALKPHOS 95 12/07/2020   AST 22  12/07/2020   ALT 20 12/07/2020   PROT 7.3 12/07/2020   ALBUMIN 4.6 12/07/2020   CALCIUM 8.5 (L) 04/15/2022   ANIONGAP 9 04/15/2022   EGFR 63 12/07/2020   Lab Results  Component Value Date   CHOL 159 12/07/2020   Lab Results  Component Value Date   HDL 52 12/07/2020   Lab Results  Component Value Date   LDLCALC 93 12/07/2020   Lab Results  Component Value Date   TRIG 75 12/07/2020   Lab Results  Component Value Date   CHOLHDL 3.1 12/07/2020   Lab Results  Component Value Date   HGBA1C 5.4 05/25/2020         Assessment & Plan:     No orders of the defined types were placed in this encounter.    I,Lauren M Auman,acting as a scribe for Blane Ohara, MD.,have documented all relevant documentation on the behalf of Blane Ohara, MD,as directed by  Blane Ohara, MD while in the presence of Blane Ohara, MD.    Precious Reel, CMA

## 2022-10-22 LAB — EPSTEIN-BARR VIRUS (EBV) ANTIBODY PROFILE
EBV NA IgG: 258 U/mL — ABNORMAL HIGH (ref 0.0–17.9)
EBV VCA IgG: 65 U/mL — ABNORMAL HIGH (ref 0.0–17.9)
EBV VCA IgM: <36 U/mL (ref 0.0–35.9)

## 2022-10-23 NOTE — Assessment & Plan Note (Signed)
Treat for sinusitis with doxycycline in case of RMSF.

## 2022-10-24 ENCOUNTER — Encounter: Payer: Self-pay | Admitting: Family Medicine

## 2022-10-24 NOTE — Telephone Encounter (Signed)
Paige Hardy spoke with patient. She continues to have sweats, but otherwise is feeling better. Recommend continue to monitor for infectious symptoms. Dr. Sedalia Muta

## 2022-11-02 ENCOUNTER — Other Ambulatory Visit: Payer: Self-pay

## 2022-11-02 LAB — LYME DISEASE SEROLOGY W/REFLEX: Lyme Total Antibody EIA: NEGATIVE

## 2022-11-02 LAB — SPOTTED FEVER GROUP ANTIBODIES
Spotted Fever Group IgG: <1:64 {titer}
Spotted Fever Group IgM: <1:64 {titer}

## 2022-11-03 ENCOUNTER — Encounter: Payer: Self-pay | Admitting: Family Medicine

## 2022-11-03 ENCOUNTER — Other Ambulatory Visit: Payer: Self-pay

## 2022-11-07 ENCOUNTER — Encounter: Payer: Self-pay | Admitting: Family Medicine

## 2022-11-07 ENCOUNTER — Ambulatory Visit (INDEPENDENT_AMBULATORY_CARE_PROVIDER_SITE_OTHER): Payer: 59 | Admitting: Family Medicine

## 2022-11-07 ENCOUNTER — Other Ambulatory Visit: Payer: Self-pay

## 2022-11-07 ENCOUNTER — Other Ambulatory Visit: Payer: Self-pay | Admitting: Pharmacist

## 2022-11-07 VITALS — BP 116/64 | HR 80 | Temp 97.0°F | Resp 16 | Ht 65.0 in | Wt 203.4 lb

## 2022-11-07 DIAGNOSIS — H6690 Otitis media, unspecified, unspecified ear: Secondary | ICD-10-CM | POA: Insufficient documentation

## 2022-11-07 DIAGNOSIS — M791 Myalgia, unspecified site: Secondary | ICD-10-CM | POA: Diagnosis not present

## 2022-11-07 DIAGNOSIS — Z1231 Encounter for screening mammogram for malignant neoplasm of breast: Secondary | ICD-10-CM | POA: Diagnosis not present

## 2022-11-07 DIAGNOSIS — H65192 Other acute nonsuppurative otitis media, left ear: Secondary | ICD-10-CM

## 2022-11-07 DIAGNOSIS — Z0001 Encounter for general adult medical examination with abnormal findings: Secondary | ICD-10-CM

## 2022-11-07 DIAGNOSIS — E559 Vitamin D deficiency, unspecified: Secondary | ICD-10-CM | POA: Diagnosis not present

## 2022-11-07 DIAGNOSIS — Z0000 Encounter for general adult medical examination without abnormal findings: Secondary | ICD-10-CM | POA: Insufficient documentation

## 2022-11-07 NOTE — Progress Notes (Unsigned)
Subjective:  Patient ID: Paige Hardy, female    DOB: [birth year]  Age: 57 y.o. MRN: [id number]  Chief Complaint  Patient presents with   Annual Exam    HPI SUBJECTIVE:  57 y.o. female for annual routine Pap and checkup.  HPI Well Adult Physical: Patient here for a comprehensive physical exam.The patient reports bilateral knee pain .Do you take any herbs or supplements that were not prescribed by a doctor? no Are you taking calcium supplements? yes - Takes MVI. Taking D3+K2.  Are you taking aspirin daily? no  Encounter for general adult medical examination without abnormal findings  Physical ("At Risk" items are starred): Patient's last physical exam was 1 year ago .  Patient is not afflicted from Stress Incontinence and Urge Incontinence  Patient wears a seat belts Patient has smoke detectors and has carbon monoxide detectors. Patient practices appropriate gun safety. Patient wears sunscreen with extended sun exposure. Dental Care: biannual cleanings, brushes and flosses daily. Ophthalmology/Optometry: Annual visit.  Hearing loss: none Vision impairments: none   Current Outpatient Medications  Medication Sig Dispense Refill   cholecalciferol (VITAMIN D3) 25 MCG (1000 UNIT) tablet Take 1,000 Units by mouth daily.     Ascorbic Acid (VITAMIN C) 1000 MG tablet Take 1,000 mg by mouth daily.     atorvastatin (LIPITOR) 20 MG tablet Take 1 tablet (20 mg total) by mouth daily. Patient needs an appointment 90 tablet 0   diphenhydrAMINE (BENADRYL) 25 MG tablet Take 12.5 mg by mouth every 6 (six) hours as needed.     losartan (COZAAR) 50 MG tablet Take 1 tablet (50 mg total) by mouth daily. Patient needs an appointment 90 tablet 0   Multiple Vitamins-Minerals (MULTIVITAMIN ADULT) CHEW Chew 2 each by mouth daily.     valACYclovir (VALTREX) 500 MG tablet Take 1 tablet (500 mg total) by mouth 2 (two) times daily. (Patient not taking: Reported on 04/13/2022) 20 tablet 2   No current  facility-administered medications for this visit.   Allergies: Patient has no known allergies.  No LMP recorded.  OBJECTIVE:  The patient appears well, alert, oriented x 3, in no distress. BP 116/64   Pulse 80   Temp (!) 97 F (36.1 C)   Resp 16   Ht 5\' 5"  (1.651 m)   Wt 203 lb 6.4 oz (92.3 kg)   BMI 33.85 kg/m  ENT normal.  Neck supple. No adenopathy or thyromegaly. PERLA. Lungs are clear, good air entry, no wheezes, rhonchi or rales. S1 and S2 normal, no murmurs, regular rate and rhythm. Abdomen soft without tenderness, guarding, mass or organomegaly. Extremities show no edema, normal peripheral pulses. Neurological is normal, no focal findings.  BREAST EXAM: {pe breast exam:315056}  PELVIC EXAM: {pelvic exam:315900}  ASSESSMENT:  {gyn assessment:315268}  PLAN:  {gyn plan:315269}      11/07/2022    3:48 PM 11/07/2022    3:43 PM 01/15/2021   10:10 AM 12/07/2020   10:05 AM 08/30/2020    7:30 PM  Depression screen PHQ 2/9  Decreased Interest 0 0 0 0 0  Down, Depressed, Hopeless 0 0 0 0 0  PHQ - 2 Score 0 0 0 0 0  Altered sleeping 1 1     Tired, decreased energy 1 1     Change in appetite 1 1     Feeling bad or failure about yourself  0 0     Trouble concentrating 0 0     Moving slowly or fidgety/restless 0 0  Suicidal thoughts 0 0     PHQ-9 Score 3 3     Difficult doing work/chores Not difficult at all Not difficult at all           11/07/2022    3:43 PM  Fall Risk   Falls in the past year? 1  Number falls in past yr: 0  Injury with Fall? 0  Risk for fall due to : No Fall Risks  Follow up Falls evaluation completed;Falls prevention discussed    Patient Care Team: CoxFritzi Mandes, MD as PCP - General (Family Medicine)   Review of Systems  Constitutional:  Positive for fatigue. Negative for chills and fever.  HENT:  Negative for congestion, rhinorrhea and sore throat.   Respiratory:  Negative for cough and shortness of breath.   Cardiovascular:  Negative for  chest pain.  Gastrointestinal:  Negative for abdominal pain, constipation, diarrhea, nausea and vomiting.  Genitourinary:  Negative for dysuria and urgency.  Musculoskeletal:  Positive for arthralgias (bilateral knee pain). Negative for back pain and myalgias.  Neurological:  Negative for dizziness, weakness, light-headedness and headaches.  Psychiatric/Behavioral:  Negative for dysphoric mood. The patient is not nervous/anxious.     Current Outpatient Medications on File Prior to Visit  Medication Sig Dispense Refill   cholecalciferol (VITAMIN D3) 25 MCG (1000 UNIT) tablet Take 1,000 Units by mouth daily.     Ascorbic Acid (VITAMIN C) 1000 MG tablet Take 1,000 mg by mouth daily.     atorvastatin (LIPITOR) 20 MG tablet Take 1 tablet (20 mg total) by mouth daily. Patient needs an appointment 90 tablet 0   diphenhydrAMINE (BENADRYL) 25 MG tablet Take 12.5 mg by mouth every 6 (six) hours as needed.     losartan (COZAAR) 50 MG tablet Take 1 tablet (50 mg total) by mouth daily. Patient needs an appointment 90 tablet 0   Multiple Vitamins-Minerals (MULTIVITAMIN ADULT) CHEW Chew 2 each by mouth daily.     valACYclovir (VALTREX) 500 MG tablet Take 1 tablet (500 mg total) by mouth 2 (two) times daily. (Patient not taking: Reported on 04/13/2022) 20 tablet 2   No current facility-administered medications on file prior to visit.   Past Medical History:  Diagnosis Date   Essential hypertension    Mixed hyperlipidemia    Vitamin D deficiency    Past Surgical History:  Procedure Laterality Date   ANKLE ARTHROSCOPY Right 04/15/2022   Procedure: RIGHT ANKLE ARTHROSCOPY;  Surgeon: [name], MD;  Location: [facility] OR;  Service: Orthopedics;  Laterality: Right;   ANKLE FRACTURE SURGERY Right    CHOLECYSTECTOMY     HARDWARE REMOVAL Right 04/15/2022   Procedure: HARDWARE REMOVAL RIGHT ANKLE;  Surgeon: [name], MD;  Location: [facility] OR;  Service: Orthopedics;  Laterality: Right;    Family History   Problem Relation Age of Onset   Hypertension Other    Prostate cancer Father    Hypertension Maternal Grandfather    Diabetes Maternal Grandfather    Social History   Socioeconomic History   Marital status: Married    Spouse name: Not on file   Number of children: Not on file   Years of education: Not on file   Highest education level: Not on file  Occupational History   Not on file  Tobacco Use   Smoking status: Never   Smokeless tobacco: Never  Vaping Use   Vaping status: Never Used  Substance and Sexual Activity   Alcohol use: Yes    Comment:  socially   Drug use: Never   Sexual activity: Not on file  Other Topics Concern   Not on file  Social History Narrative   Not on file   Social Determinants of Health   Financial Resource Strain: Low Risk  (11/07/2022)   Overall Financial Resource Strain (CARDIA)    Difficulty of Paying Living Expenses: Not hard at all  Food Insecurity: No Food Insecurity (11/07/2022)   Hunger Vital Sign    Worried About Running Out of Food in the Last Year: Never true    Ran Out of Food in the Last Year: Never true  Transportation Needs: No Transportation Needs (11/07/2022)   PRAPARE - Administrator, Civil Service (Medical): No    Lack of Transportation (Non-Medical): No  Physical Activity: Insufficiently Active (11/07/2022)   Exercise Vital Sign    Days of Exercise per Week: 2 days    Minutes of Exercise per Session: 10 min  Stress: No Stress Concern Present (11/07/2022)   [employer] of Occupational Health - Occupational Stress Questionnaire    Feeling of Stress : Only a little  Social Connections: Socially Integrated (11/07/2022)   Social Connection and Isolation Panel [NHANES]    Frequency of Communication with Friends and Family: More than three times a week    Frequency of Social Gatherings with Friends and Family: More than three times a week    Attends Religious Services: More than 4 times per year    Active Member  of [employer] or Organizations: Yes    Attends Engineer, structural: More than 4 times per year    Marital Status: Married    Objective:  BP 116/64   Pulse 80   Temp (!) 97 F (36.1 C)   Resp 16   Ht 5\' 5"  (1.651 m)   Wt 203 lb 6.4 oz (92.3 kg)   BMI 33.85 kg/m      11/07/2022    3:41 PM 10/21/2022    9:01 AM 04/15/2022    9:00 AM  BP/Weight  Systolic BP 116 142 140  Diastolic BP 64 88 99  Wt. (Lbs) 203.4 202   BMI 33.85 kg/m2 33.61 kg/m2     Physical Exam  Diabetic Foot Exam - Simple   No data filed      Lab Results  Component Value Date   WBC 19.2 (H) 10/21/2022   HGB 12.6 10/21/2022   HCT 39.9 10/21/2022   PLT 243 10/21/2022   GLUCOSE 139 (H) 10/21/2022   CHOL 159 12/07/2020   TRIG 75 12/07/2020   HDL 52 12/07/2020   LDLCALC 93 12/07/2020   ALT 18 10/21/2022   AST 23 10/21/2022   NA 136 10/21/2022   K 3.9 10/21/2022   CL 97 10/21/2022   CREATININE 1.07 (H) 10/21/2022   BUN 11 10/21/2022   CO2 21 10/21/2022   TSH 2.790 05/25/2020   HGBA1C 5.4 05/25/2020      Assessment & Plan:    There are no diagnoses linked to this encounter.   No orders of the defined types were placed in this encounter.   No orders of the defined types were placed in this encounter.    Follow-up: No follow-ups on file.   I,Paige Hardy,acting as a Neurosurgeon for Blane Ohara, MD.,have documented all relevant documentation on the behalf of Blane Ohara, MD,as directed by  Blane Ohara, MD while in the presence of Blane Ohara, MD.   An After Visit Summary was printed and  given to the patient.  Blane Ohara, MD [facility] [phone]

## 2022-11-08 ENCOUNTER — Other Ambulatory Visit: Payer: Self-pay

## 2022-11-08 ENCOUNTER — Inpatient Hospital Stay: Payer: 59

## 2022-11-08 ENCOUNTER — Other Ambulatory Visit (HOSPITAL_COMMUNITY): Payer: Self-pay

## 2022-11-08 ENCOUNTER — Inpatient Hospital Stay: Payer: 59 | Attending: Family Medicine

## 2022-11-08 DIAGNOSIS — M791 Myalgia, unspecified site: Secondary | ICD-10-CM | POA: Insufficient documentation

## 2022-11-08 DIAGNOSIS — E559 Vitamin D deficiency, unspecified: Secondary | ICD-10-CM | POA: Diagnosis not present

## 2022-11-08 DIAGNOSIS — Z0001 Encounter for general adult medical examination with abnormal findings: Secondary | ICD-10-CM | POA: Insufficient documentation

## 2022-11-08 LAB — CBC WITH DIFFERENTIAL (CANCER CENTER ONLY)
Abs Immature Granulocytes: 0.02 10*3/uL (ref 0.00–0.07)
Basophils Absolute: 0.1 10*3/uL (ref 0.0–0.1)
Basophils Relative: 1 %
Eosinophils Absolute: 0.1 10*3/uL (ref 0.0–0.5)
Eosinophils Relative: 2 %
HCT: 39.8 % (ref 36.0–46.0)
Hemoglobin: 12.3 g/dL (ref 12.0–15.0)
Immature Granulocytes: 0 %
Lymphocytes Relative: 27 %
Lymphs Abs: 1.9 10*3/uL (ref 0.7–4.0)
MCH: 26.7 pg (ref 26.0–34.0)
MCHC: 30.9 g/dL (ref 30.0–36.0)
MCV: 86.5 fL (ref 80.0–100.0)
Monocytes Absolute: 0.5 10*3/uL (ref 0.1–1.0)
Monocytes Relative: 7 %
Neutro Abs: 4.3 10*3/uL (ref 1.7–7.7)
Neutrophils Relative %: 63 %
Platelet Count: 269 10*3/uL (ref 150–400)
RBC: 4.6 MIL/uL (ref 3.87–5.11)
RDW: 14.6 % (ref 11.5–15.5)
WBC Count: 6.9 10*3/uL (ref 4.0–10.5)
nRBC: 0 % (ref 0.0–0.2)

## 2022-11-08 LAB — CMP (CANCER CENTER ONLY)
ALT: 27 U/L (ref 0–44)
AST: 25 U/L (ref 15–41)
Albumin: 4 g/dL (ref 3.5–5.0)
Alkaline Phosphatase: 88 U/L (ref 38–126)
Anion gap: 9 (ref 5–15)
BUN: 14 mg/dL (ref 6–20)
CO2: 26 mmol/L (ref 22–32)
Calcium: 8.6 mg/dL — ABNORMAL LOW (ref 8.9–10.3)
Chloride: 102 mmol/L (ref 98–111)
Creatinine: 0.99 mg/dL (ref 0.44–1.00)
GFR, Estimated: >60 mL/min (ref >60)
Glucose, Bld: 99 mg/dL (ref 70–99)
Potassium: 3.7 mmol/L (ref 3.5–5.1)
Sodium: 137 mmol/L (ref 135–145)
Total Bilirubin: 0.6 mg/dL (ref 0.3–1.2)
Total Protein: 7.5 g/dL (ref 6.5–8.1)

## 2022-11-08 LAB — LIPID PANEL
Cholesterol: 154 mg/dL (ref 0–200)
HDL: 45 mg/dL (ref >40)
LDL Cholesterol: 91 mg/dL (ref 0–99)
Total CHOL/HDL Ratio: 3.4 RATIO
Triglycerides: 92 mg/dL (ref <150)
VLDL: 18 mg/dL (ref 0–40)

## 2022-11-08 LAB — TSH: TSH: 2.432 u[IU]/mL (ref 0.350–4.500)

## 2022-11-08 LAB — VITAMIN D 25 HYDROXY (VIT D DEFICIENCY, FRACTURES): Vit D, 25-Hydroxy: 63.39 ng/mL (ref 30–100)

## 2022-11-08 LAB — MAGNESIUM: Magnesium: 2.2 mg/dL (ref 1.7–2.4)

## 2022-11-08 LAB — PHOSPHORUS: Phosphorus: 3.9 mg/dL (ref 2.5–4.6)

## 2022-11-08 MED ORDER — AMOXICILLIN-POT CLAVULANATE 875-125 MG PO TABS
1.0000 | ORAL_TABLET | Freq: Two times a day (BID) | ORAL | 0 refills | Status: DC
  Filled 2022-11-08: qty 20, 10d supply, fill #0

## 2022-11-08 NOTE — Patient Instructions (Signed)

## 2022-11-08 NOTE — Assessment & Plan Note (Signed)

## 2022-11-08 NOTE — Assessment & Plan Note (Signed)
 Check labs 

## 2022-11-08 NOTE — Assessment & Plan Note (Signed)
Check vitamin D level 

## 2022-11-08 NOTE — Assessment & Plan Note (Signed)
 Sent Augmentin.

## 2022-11-09 ENCOUNTER — Other Ambulatory Visit (HOSPITAL_COMMUNITY): Payer: Self-pay

## 2022-11-17 ENCOUNTER — Other Ambulatory Visit: Payer: Self-pay | Admitting: Gastroenterology

## 2022-11-18 ENCOUNTER — Other Ambulatory Visit
Admission: RE | Admit: 2022-11-18 | Discharge: 2022-11-18 | Disposition: A | Discharge status: Home / Self Care | Payer: Medicaid Other | Source: Ambulatory Visit | Attending: Family | Admitting: Family

## 2022-11-18 DIAGNOSIS — E1121 Type 2 diabetes mellitus with diabetic nephropathy: Secondary | ICD-10-CM | POA: Insufficient documentation

## 2022-11-18 LAB — COMPREHENSIVE METABOLIC PANEL
ALT: 40 U/L — ABNORMAL HIGH (ref 0–35)
AST: 26 U/L (ref 0–35)
Albumin: 4.4 g/dL (ref 3.5–5.2)
Alk Phos: 153 U/L — ABNORMAL HIGH (ref 35–105)
Anion Gap: 11 (ref 7–16)
Bilirubin,Total: 0.6 mg/dL (ref 0.0–1.2)
CO2: 27 mmol/L (ref 20–28)
Calcium: 9.9 mg/dL (ref 8.6–10.2)
Chloride: 103 mmol/L (ref 96–108)
Creatinine: 0.74 mg/dL (ref 0.51–0.95)
Glucose: 100 mg/dL — ABNORMAL HIGH (ref 60–99)
Lab: 15 mg/dL (ref 6–20)
Potassium: 4.9 mmol/L (ref 3.3–5.1)
Sodium: 141 mmol/L (ref 133–145)
Total Protein: 7.6 g/dL (ref 6.3–7.7)
eGFR BY CREAT: 95 *

## 2022-11-18 LAB — HEMOGLOBIN A1C: Hemoglobin A1C: 6.2 % — ABNORMAL HIGH

## 2022-12-08 ENCOUNTER — Other Ambulatory Visit: Payer: Self-pay | Admitting: Family Medicine

## 2022-12-08 ENCOUNTER — Other Ambulatory Visit (HOSPITAL_COMMUNITY): Payer: Self-pay

## 2022-12-08 MED ORDER — LOSARTAN POTASSIUM 50 MG PO TABS
50.0000 mg | ORAL_TABLET | Freq: Every day | ORAL | 0 refills | Status: DC
  Filled 2022-12-08: qty 90, 90d supply, fill #0

## 2022-12-08 MED ORDER — ATORVASTATIN CALCIUM 20 MG PO TABS
20.0000 mg | ORAL_TABLET | Freq: Every day | ORAL | 0 refills | Status: DC
  Filled 2022-12-08: qty 90, 90d supply, fill #0

## 2022-12-09 ENCOUNTER — Other Ambulatory Visit: Payer: Self-pay

## 2022-12-12 ENCOUNTER — Encounter: Payer: Self-pay | Admitting: Family Medicine

## 2022-12-12 ENCOUNTER — Other Ambulatory Visit: Payer: Self-pay | Admitting: Oncology

## 2022-12-12 MED ORDER — PREDNISONE 10 MG PO TABS: ORAL_TABLET | ORAL | 0 refills | Status: DC

## 2023-01-04 ENCOUNTER — Ambulatory Visit
Admission: RE | Admit: 2023-01-04 | Discharge: 2023-01-04 | Disposition: A | Discharge status: Home / Self Care | Payer: 59 | Source: Ambulatory Visit | Attending: Family Medicine | Admitting: Family Medicine

## 2023-01-04 DIAGNOSIS — Z1231 Encounter for screening mammogram for malignant neoplasm of breast: Secondary | ICD-10-CM | POA: Diagnosis not present

## 2023-01-23 ENCOUNTER — Other Ambulatory Visit: Payer: Self-pay

## 2023-01-27 ENCOUNTER — Other Ambulatory Visit: Payer: Self-pay

## 2023-02-06 ENCOUNTER — Encounter: Payer: Self-pay | Admitting: Family Medicine

## 2023-02-06 ENCOUNTER — Other Ambulatory Visit: Payer: Self-pay

## 2023-02-07 NOTE — Progress Notes (Unsigned)
Acute Office Visit  Subjective:    Patient ID: Paige Hardy, female    DOB: 02-Jun-1965, 57 y.o.   MRN: [id number]  Chief Complaint  Patient presents with   Cramping    HPI: Patient is in today for abnormal bleeding with cramping. Patient has not had a period in over 1.5 years.   Past Medical History:  Diagnosis Date   Essential hypertension    Mixed hyperlipidemia    Vitamin D deficiency     Past Surgical History:  Procedure Laterality Date   ANKLE ARTHROSCOPY Right 04/15/2022   Procedure: RIGHT ANKLE ARTHROSCOPY;  Surgeon: [name], MD;  Location: [facility] OR;  Service: Orthopedics;  Laterality: Right;   ANKLE FRACTURE SURGERY Right    CHOLECYSTECTOMY     HARDWARE REMOVAL Right 04/15/2022   Procedure: HARDWARE REMOVAL RIGHT ANKLE;  Surgeon: [name], MD;  Location: [facility] OR;  Service: Orthopedics;  Laterality: Right;    Family History  Problem Relation Age of Onset   Hypertension Other    Prostate cancer Father    Hypertension Maternal Grandfather    Diabetes Maternal Grandfather     Social History   Socioeconomic History   Marital status: Married    Spouse name: Not on file   Number of children: Not on file   Years of education: Not on file   Highest education level: Not on file  Occupational History   Not on file  Tobacco Use   Smoking status: Never   Smokeless tobacco: Never  Vaping Use   Vaping status: Never Used  Substance and Sexual Activity   Alcohol use: Yes    Comment: socially   Drug use: Never   Sexual activity: Not on file  Other Topics Concern   Not on file  Social History Narrative   Not on file   Social Determinants of Health   Financial Resource Strain: Low Risk  (11/07/2022)   Overall Financial Resource Strain (CARDIA)    Difficulty of Paying Living Expenses: Not hard at all  Food Insecurity: No Food Insecurity (11/07/2022)   Hunger Vital Sign    Worried About Running Out of Food in the Last Year: Never true    Ran Out of  Food in the Last Year: Never true  Transportation Needs: No Transportation Needs (11/07/2022)   PRAPARE - Administrator, Civil Service (Medical): No    Lack of Transportation (Non-Medical): No  Physical Activity: Insufficiently Active (11/07/2022)   Exercise Vital Sign    Days of Exercise per Week: 2 days    Minutes of Exercise per Session: 10 min  Stress: No Stress Concern Present (11/07/2022)   [employer] of Occupational Health - Occupational Stress Questionnaire    Feeling of Stress : Only a little  Social Connections: Socially Integrated (11/07/2022)   Social Connection and Isolation Panel [NHANES]    Frequency of Communication with Friends and Family: More than three times a week    Frequency of Social Gatherings with Friends and Family: More than three times a week    Attends Religious Services: More than 4 times per year    Active Member of [employer] or Organizations: Yes    Attends Banker Meetings: More than 4 times per year    Marital Status: Married  Catering manager Violence: Not At Risk (11/07/2022)   Humiliation, Afraid, Rape, and Kick questionnaire    Fear of Current or Ex-Partner: No    Emotionally Abused: No  Physically Abused: No    Sexually Abused: No    Outpatient Medications Prior to Visit  Medication Sig Dispense Refill   amoxicillin-clavulanate (AUGMENTIN) 875-125 MG tablet Take 1 tablet by mouth 2 (two) times daily. 20 tablet 0   Ascorbic Acid (VITAMIN C) 1000 MG tablet Take 1,000 mg by mouth daily.     atorvastatin (LIPITOR) 20 MG tablet Take 1 tablet (20 mg total) by mouth daily. Patient needs an appointment 90 tablet 0   cholecalciferol (VITAMIN D3) 25 MCG (1000 UNIT) tablet Take 1,000 Units by mouth daily.     diphenhydrAMINE (BENADRYL) 25 MG tablet Take 12.5 mg by mouth every 6 (six) hours as needed.     losartan (COZAAR) 50 MG tablet Take 1 tablet (50 mg total) by mouth daily. Patient needs an appointment 90 tablet 0    Multiple Vitamins-Minerals (MULTIVITAMIN ADULT) CHEW Chew 2 each by mouth daily.     predniSONE (DELTASONE) 10 MG tablet 6 tabs day 1, 5 tabs day 2, 4 tabs day 3, 3 tabs day 4, 2 tabs day 5, 1 tab day 6, then off 21 tablet 0   valACYclovir (VALTREX) 500 MG tablet Take 1 tablet (500 mg total) by mouth 2 (two) times daily. (Patient not taking: Reported on 04/13/2022) 20 tablet 2   No facility-administered medications prior to visit.    No Known Allergies  Review of Systems  Constitutional:  Negative for appetite change, fatigue and fever.  HENT:  Negative for congestion, ear pain, sinus pressure and sore throat.   Respiratory:  Negative for cough, chest tightness, shortness of breath and wheezing.   Cardiovascular:  Negative for chest pain and palpitations.  Gastrointestinal:  Negative for abdominal pain, constipation, diarrhea, nausea and vomiting.  Genitourinary:  Negative for dysuria and hematuria.  Musculoskeletal:  Negative for arthralgias, back pain, joint swelling and myalgias.  Skin:  Negative for rash.  Neurological:  Negative for dizziness, weakness and headaches.  Psychiatric/Behavioral:  Negative for dysphoric mood. The patient is not nervous/anxious.        Objective:        11/07/2022    3:41 PM 10/21/2022    9:01 AM 04/15/2022    9:00 AM  Vitals with BMI  Height 5\' 5"  5\' 5"    Weight 203 lbs 6 oz 202 lbs   BMI 33.85 33.61   Systolic 116 142 409  Diastolic 64 88 99  Pulse 80 142 73    No data found.   Physical Exam  Health Maintenance Due  Topic Date Due   HIV Screening  Never done   Hepatitis C Screening  Never done   Zoster Vaccines- Shingrix (1 of 2) Never done   INFLUENZA VACCINE  11/24/2022   COVID-19 Vaccine (3 - 2023-24 season) 12/25/2022    There are no preventive care reminders to display for this patient.   Lab Results  Component Value Date   TSH 2.432 11/08/2022   Lab Results  Component Value Date   WBC 6.9 11/08/2022   HGB 12.3  11/08/2022   HCT 39.8 11/08/2022   MCV 86.5 11/08/2022   PLT 269 11/08/2022   Lab Results  Component Value Date   NA 137 11/08/2022   K 3.7 11/08/2022   CO2 26 11/08/2022   GLUCOSE 99 11/08/2022   BUN 14 11/08/2022   CREATININE 0.99 11/08/2022   BILITOT 0.6 11/08/2022   ALKPHOS 88 11/08/2022   AST 25 11/08/2022   ALT 27 11/08/2022   PROT 7.5  11/08/2022   ALBUMIN 4.0 11/08/2022   CALCIUM 8.6 (L) 11/08/2022   ANIONGAP 9 11/08/2022   EGFR 61 10/21/2022   Lab Results  Component Value Date   CHOL 154 11/08/2022   Lab Results  Component Value Date   HDL 45 11/08/2022   Lab Results  Component Value Date   LDLCALC 91 11/08/2022   Lab Results  Component Value Date   TRIG 92 11/08/2022   Lab Results  Component Value Date   CHOLHDL 3.4 11/08/2022   Lab Results  Component Value Date   HGBA1C 5.4 05/25/2020       Assessment & Plan:  There are no diagnoses linked to this encounter.   No orders of the defined types were placed in this encounter.   No orders of the defined types were placed in this encounter.    Follow-up: No follow-ups on file.  An After Visit Summary was printed and given to the patient.    I,Arantxa Piercey M Sherrel Shafer,acting as a Neurosurgeon for [employer], MD.,have documented all relevant documentation on the behalf of Windell Moment, MD,as directed by  Windell Moment, MD while in the presence of Windell Moment, MD.   Windell Moment, MD [facility] [phone]

## 2023-02-09 ENCOUNTER — Ambulatory Visit: Payer: Medicaid Other | Admitting: Sleep Medicine

## 2023-03-09 ENCOUNTER — Other Ambulatory Visit (HOSPITAL_BASED_OUTPATIENT_CLINIC_OR_DEPARTMENT_OTHER): Payer: Self-pay

## 2023-03-09 ENCOUNTER — Other Ambulatory Visit: Payer: Self-pay | Admitting: Physician Assistant

## 2023-03-10 ENCOUNTER — Other Ambulatory Visit (HOSPITAL_BASED_OUTPATIENT_CLINIC_OR_DEPARTMENT_OTHER): Payer: Self-pay

## 2023-03-10 MED ORDER — LOSARTAN POTASSIUM 50 MG PO TABS
50.0000 mg | ORAL_TABLET | Freq: Every day | ORAL | 0 refills | Status: DC
  Filled 2023-03-10: qty 90, 90d supply, fill #0

## 2023-03-13 ENCOUNTER — Ambulatory Visit: Payer: Medicaid Other | Admitting: Sleep Medicine

## 2023-03-14 ENCOUNTER — Other Ambulatory Visit: Payer: Self-pay | Admitting: Physician Assistant

## 2023-03-14 ENCOUNTER — Other Ambulatory Visit (HOSPITAL_BASED_OUTPATIENT_CLINIC_OR_DEPARTMENT_OTHER): Payer: Self-pay

## 2023-03-14 MED ORDER — ATORVASTATIN CALCIUM 20 MG PO TABS
20.0000 mg | ORAL_TABLET | Freq: Every day | ORAL | 0 refills | Status: DC
  Filled 2023-03-14: qty 90, 90d supply, fill #0

## 2023-04-05 ENCOUNTER — Other Ambulatory Visit (HOSPITAL_BASED_OUTPATIENT_CLINIC_OR_DEPARTMENT_OTHER): Payer: Self-pay

## 2023-04-05 ENCOUNTER — Encounter (HOSPITAL_BASED_OUTPATIENT_CLINIC_OR_DEPARTMENT_OTHER): Payer: Self-pay

## 2023-04-05 ENCOUNTER — Ambulatory Visit (HOSPITAL_BASED_OUTPATIENT_CLINIC_OR_DEPARTMENT_OTHER)
Admission: RE | Admit: 2023-04-05 | Discharge: 2023-04-05 | Disposition: A | Discharge status: Home / Self Care | Payer: 59 | Source: Ambulatory Visit | Attending: Internal Medicine | Admitting: Internal Medicine

## 2023-04-05 VITALS — BP 156/107 | HR 94 | Temp 98.6°F | Resp 20 | Wt 205.0 lb

## 2023-04-05 DIAGNOSIS — J069 Acute upper respiratory infection, unspecified: Secondary | ICD-10-CM

## 2023-04-05 MED ORDER — PROMETHAZINE-DM 6.25-15 MG/5ML PO SYRP
5.0000 mL | ORAL_SOLUTION | Freq: Every evening | ORAL | 0 refills | Status: DC | PRN
  Filled 2023-04-05: qty 118, 23d supply, fill #0

## 2023-04-05 MED ORDER — BENZONATATE 100 MG PO CAPS
100.0000 mg | ORAL_CAPSULE | Freq: Three times a day (TID) | ORAL | 0 refills | Status: DC
  Filled 2023-04-05: qty 21, 7d supply, fill #0

## 2023-04-05 NOTE — ED Triage Notes (Signed)
Cough, sinus congestion. Noticed yesterday.  Has been taking dayquil. Blood pressure elevated on right arm. Patient states she has a hx of HTN. Rechecked on left arm. 144/93

## 2023-04-05 NOTE — ED Provider Notes (Signed)
[facility]    CSN: [id number] Arrival date & time: 04/05/23  1135      History   Chief Complaint Chief Complaint  Patient presents with   Cough    HPI TRYSHA METZGAR is a 57 y.o. female.   JUSTENE HINCHCLIFFE is a 57 y.o. female presenting for chief complaint of cough, nasal congestion, and frequent throat clearing that started yesterday.  Cough is sometimes productive with clear sputum.  Nasal congestion is clear and she additionally reports bilateral ear discomfort/pressure.  Denies fevers, chills, nausea, shortness of breath, chest pain, heart palpitations, vomiting, abdominal pain, rash, dizziness, sore throat, and ear drainage.  Some of her coworkers have been sick with similar symptoms, otherwise no recent sick contacts.  Former smoker many years ago in high school, no other drug use.  No history of chronic respiratory problems/asthma.  History of seasonal allergies, takes 25 mg of Benadryl daily in the mornings and sometimes takes this at bedtime as well.  She is not using any other over-the-counter medications to help with symptoms PTA.   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   Acute infection of nasal sinus 03/17/2021   Colon cancer screening 11/20/2019   Class 1 obesity due to excess calories with serious comorbidity and body mass index (BMI) of 30.0 to 30.9 in adult 08/20/2019   Essential hypertension    Mixed hyperlipidemia    Vitamin D deficiency     Past Surgical History:  Procedure Laterality Date   ANKLE ARTHROSCOPY Right 04/15/2022    Procedure: RIGHT ANKLE ARTHROSCOPY;  Surgeon: [name], MD;  Location: [facility] OR;  Service: Orthopedics;  Laterality: Right;   ANKLE FRACTURE SURGERY Right    CHOLECYSTECTOMY     HARDWARE REMOVAL Right 04/15/2022   Procedure: HARDWARE REMOVAL RIGHT ANKLE;  Surgeon: [name], MD;  Location: [facility] OR;  Service: Orthopedics;  Laterality: Right;    OB History   No obstetric history on file.      Home Medications    Prior to Admission medications   Medication Sig Start Date End Date Taking? Authorizing Provider  benzonatate (TESSALON) 100 MG capsule Take 1 capsule (100 mg total) by mouth every 8 (eight) hours. 04/05/23  Yes Carlisle Beers, FNP  promethazine-dextromethorphan (PROMETHAZINE-DM) 6.25-15 MG/5ML syrup Take 5 mLs by mouth at bedtime as needed for cough. 04/05/23  Yes Carlisle Beers, FNP  amoxicillin-clavulanate (AUGMENTIN) 875-125 MG tablet Take 1 tablet by mouth 2 (two) times daily. 11/08/22   CoxFritzi Mandes, MD  Ascorbic Acid (VITAMIN C) 1000 MG tablet Take 1,000 mg by mouth daily.    [provider]  atorvastatin (LIPITOR) 20 MG tablet Take 1 tablet (20 mg total) by mouth daily. Patient needs an appointment 03/14/23   Langley Gauss, PA  cholecalciferol (VITAMIN D3) 25 MCG (1000 UNIT) tablet Take 1,000 Units by mouth daily.    [provider]  diphenhydrAMINE (BENADRYL) 25 MG tablet Take 12.5 mg by mouth every 6 (six) hours as needed.    [provider]  losartan (COZAAR) 50 MG tablet Take 1 tablet (50 mg total) by mouth daily. Patient needs a fasting follow up appointment 03/10/23 06/08/23  Langley Gauss, PA  Multiple Vitamins-Minerals (MULTIVITAMIN ADULT) CHEW Chew 2 each by mouth daily.    [provider]  predniSONE (DELTASONE) 10 MG tablet 6 tabs day 1, 5 tabs day 2, 4 tabs day 3, 3 tabs day 4, 2 tabs day 5, 1 tab day 6, then off 12/12/22   Lewis, Dequincy A, MD  valACYclovir (VALTREX) 500 MG tablet Take 1 tablet (500 mg total)  by mouth 2 (two) times daily. Patient not taking: Reported on 04/13/2022 07/05/21   CoxFritzi Mandes, MD    Family History Family History  Problem Relation Age of Onset   Hypertension Other    Prostate cancer Father    Hypertension Maternal Grandfather    Diabetes Maternal Grandfather     Social History Social History   Tobacco Use   Smoking status: Never   Smokeless tobacco: Never  Vaping Use   Vaping status: Never Used  Substance Use Topics   Alcohol use: Yes    Comment: socially   Drug use: Never     Allergies   Patient has no known allergies.   Review of Systems Review of Systems  Respiratory:  Positive for cough.   Per HPI   Physical Exam Triage Vital Signs ED Triage Vitals  Encounter Vitals Group     BP 04/05/23 1159 (!) 156/107     Systolic BP Percentile --      Diastolic BP Percentile --      Pulse Rate 04/05/23 1159 94     Resp 04/05/23 1159 20     Temp 04/05/23 1159 98.6 F (37 C)     Temp Source 04/05/23 1159 Oral     SpO2 04/05/23 1159 97 %     Weight 04/05/23 1201 205 lb (93 kg)     Height --      Head Circumference --      Peak Flow --      Pain Score 04/05/23 1201 0     Pain Loc --      Pain Education --      Exclude from Growth Chart --    No data found.  Updated Vital Signs BP (!) 156/107 (BP Location: Right Arm)   Pulse 94   Temp 98.6 F (37 C) (Oral)   Resp 20   Wt 205 lb (93 kg)   LMP 01/30/2023   SpO2 97%   BMI 34.11 kg/m   Visual Acuity Right Eye Distance:   Left Eye Distance:   Bilateral Distance:    Right Eye Near:   Left Eye Near:    Bilateral Near:     Physical Exam Vitals and nursing note reviewed.  Constitutional:      Appearance: She is not ill-appearing or toxic-appearing.  HENT:     Head: Normocephalic and atraumatic.     Right Ear: Hearing, tympanic membrane, ear canal and external ear normal.     Left Ear: Hearing, tympanic membrane, ear canal and external ear normal.     Nose: Rhinorrhea present.      Mouth/Throat:     Lips: Pink.     Mouth: Mucous membranes are moist. No injury.     Tongue: No lesions. Tongue does not deviate from midline.     Palate: No mass and lesions.     Pharynx: Oropharynx is clear. Uvula midline. Postnasal  drip present. No pharyngeal swelling, oropharyngeal exudate, posterior oropharyngeal erythema or uvula swelling.     Tonsils: No tonsillar exudate or tonsillar abscesses.  Eyes:     General: Lids are normal. Vision grossly intact. Gaze aligned appropriately.     Extraocular Movements: Extraocular movements intact.     Conjunctiva/sclera: Conjunctivae normal.  Cardiovascular:     Rate and Rhythm: Normal rate and regular rhythm.     Heart sounds: Normal heart sounds, S1 normal and S2 normal.  Pulmonary:     Effort: Pulmonary effort is normal. No respiratory distress.     Breath sounds: Normal breath sounds and air entry.  Musculoskeletal:     Cervical back: Neck supple.  Skin:    General: Skin is warm and dry.     Capillary Refill: Capillary refill takes less than 2 seconds.     Findings: No rash.  Neurological:     General: No focal deficit present.     Mental Status: She is alert and oriented to person, place, and time. Mental status is at baseline.     Cranial Nerves: No dysarthria or facial asymmetry.  Psychiatric:        Mood and Affect: Mood normal.        Speech: Speech normal.        Behavior: Behavior normal.        Thought Content: Thought content normal.        Judgment: Judgment normal.      UC Treatments / Results  Labs (all labs ordered are listed, but only abnormal results are displayed) Labs Reviewed  SARS CORONAVIRUS 2 (TAT 6-24 HRS)    EKG   Radiology No results found.  Procedures Procedures (including critical care time)  Medications Ordered in UC Medications - No data to display  Initial Impression / Assessment and Plan / UC Course  I have reviewed the triage vital signs and the nursing notes.  Pertinent  labs & imaging results that were available during my care of the patient were reviewed by me and considered in my medical decision making (see chart for details).   1. Viral URI with cough Suspect viral URI, viral syndrome. Physical exam findings reassuring, vital signs hemodynamically stable. Low suspicion for pneumonia/acute cardiopulmonary abnormality, therefore deferred imaging of the chest. Advised supportive care, offered prescriptions for symptomatic relief.  Recommend continued use of OTC medications as needed, recommendations discussed with patient/caregiver and outlined in AVS.  Strep/viral testing: COVID-19 testing pending.  Counseled patient on potential for adverse effects with medications prescribed/recommended today, strict ER and return-to-clinic precautions discussed, patient verbalized understanding.    Final Clinical Impressions(s) / UC Diagnoses   Final diagnoses:  Viral URI with cough     Discharge Instructions      You have a viral illness which will improve on its own with rest, fluids, and medications to help with your symptoms. We discussed prescriptions that may help with your symptoms: tessalon perles, promethazine- DM as needed for cough at bedtime You may use over the counter medicines as needed: tylenol/motrin, mucinex, zyrtec, Flonase Two teaspoons of honey in 1 cup of warm water every 4-6 hours may help with throat pains. Humidifier in room at nighttime may help soothe cough (clean well daily).   For chest pain, shortness of breath, inability to keep food or fluids down without vomiting, fever that does not respond to tylenol or motrin, or any other severe symptoms, please go to the ER for further evaluation. Return to urgent  care as needed, otherwise follow-up with PCP.       ED Prescriptions     Medication Sig Dispense Auth. Provider   benzonatate (TESSALON) 100 MG capsule Take 1 capsule (100 mg total) by mouth every 8 (eight) hours. 21 capsule  Carlisle Beers, FNP   promethazine-dextromethorphan (PROMETHAZINE-DM) 6.25-15 MG/5ML syrup Take 5 mLs by mouth at bedtime as needed for cough. 118 mL Carlisle Beers, FNP      PDMP not reviewed this encounter.   Carlisle Beers, [place] 04/05/23 1313

## 2023-04-05 NOTE — Discharge Instructions (Signed)
You have a viral illness which will improve on its own with rest, fluids, and medications to help with your symptoms. We discussed prescriptions that may help with your symptoms: tessalon perles, promethazine- DM as needed for cough at bedtime You may use over the counter medicines as needed: tylenol/motrin, mucinex, zyrtec, Flonase Two teaspoons of honey in 1 cup of warm water every 4-6 hours may help with throat pains. Humidifier in room at nighttime may help soothe cough (clean well daily).   For chest pain, shortness of breath, inability to keep food or fluids down without vomiting, fever that does not respond to tylenol or motrin, or any other severe symptoms, please go to the ER for further evaluation. Return to urgent care as needed, otherwise follow-up with PCP.

## 2023-04-06 LAB — SARS CORONAVIRUS 2 (TAT 6-24 HRS): SARS Coronavirus 2: POSITIVE — AB

## 2023-04-11 ENCOUNTER — Other Ambulatory Visit: Payer: Self-pay

## 2023-04-11 DIAGNOSIS — L4050 Arthropathic psoriasis, unspecified: Secondary | ICD-10-CM

## 2023-04-11 MED ORDER — STELARA 90 MG/ML SC SOSY
90.0000 mg | PREFILLED_SYRINGE | SUBCUTANEOUS | 2 refills | Status: DC
Start: 2023-04-11 — End: 2023-12-27
  Filled 2023-04-11 – 2023-04-18 (×2): qty 1, 84d supply, fill #0
  Filled 2023-07-12: qty 1, 84d supply, fill #1
  Filled 2023-10-02: qty 1, 84d supply, fill #2

## 2023-04-18 ENCOUNTER — Other Ambulatory Visit: Payer: Self-pay

## 2023-04-20 ENCOUNTER — Other Ambulatory Visit: Payer: Self-pay | Admitting: Pharmacist

## 2023-04-20 ENCOUNTER — Other Ambulatory Visit: Payer: Self-pay

## 2023-06-01 ENCOUNTER — Other Ambulatory Visit (HOSPITAL_BASED_OUTPATIENT_CLINIC_OR_DEPARTMENT_OTHER): Payer: Self-pay

## 2023-06-01 ENCOUNTER — Other Ambulatory Visit: Payer: Self-pay | Admitting: Physician Assistant

## 2023-06-01 MED ORDER — ATORVASTATIN CALCIUM 20 MG PO TABS
20.0000 mg | ORAL_TABLET | Freq: Every day | ORAL | 0 refills | Status: DC
  Filled 2023-06-01: qty 90, 90d supply, fill #0

## 2023-06-01 MED ORDER — LOSARTAN POTASSIUM 50 MG PO TABS
50.0000 mg | ORAL_TABLET | Freq: Every day | ORAL | 0 refills | Status: DC
  Filled 2023-06-01: qty 90, 90d supply, fill #0

## 2023-06-22 ENCOUNTER — Telehealth: Payer: Self-pay

## 2023-06-22 NOTE — Telephone Encounter (Signed)
 Copied from CRM [id number]. Topic: Clinical - Lab/Test Results >> Jun 22, 2023  3:20 PM Geneva B wrote: Reason for CRM: patients wants to know if she can get lab order for possible gout please call back  [phone]

## 2023-06-22 NOTE — Telephone Encounter (Signed)
 Voice mail is not set up.

## 2023-06-23 NOTE — Telephone Encounter (Signed)
 Called back x2 today again and patient's phone does not have voicemail set up yet.

## 2023-06-26 ENCOUNTER — Other Ambulatory Visit: Payer: Self-pay | Admitting: Family Medicine

## 2023-06-26 DIAGNOSIS — M79672 Pain in left foot: Secondary | ICD-10-CM

## 2023-06-26 NOTE — Telephone Encounter (Signed)
 Order labs (cbc, uric acid level) Dr. Sedalia Muta

## 2023-06-26 NOTE — Telephone Encounter (Signed)
 Unable to contact patient at work number or cell number (VM unavailable). Patient NEEDS appt. Per Dr. Sedalia Muta she can order labs however she does want patient to schedule appt with her as well.

## 2023-06-26 NOTE — Telephone Encounter (Signed)
 Copied from CRM [id number]. Topic: Clinical - Request for Lab/Test Order >> Jun 26, 2023  2:55 PM Marland Kitchen D wrote: Reason for CRM: Patient is requesting labs to be ordered due to swelling in her left foot. She wants Dr. Sedalia Muta to fax over orders. Patient would also like to receive a call back at her work number [phone]. Patient is available at 7:45 am and 4:30-4:45 pm .

## 2023-06-26 NOTE — Telephone Encounter (Signed)
 Lab orders are on my desk, just need a fax number to fax them to.

## 2023-06-27 ENCOUNTER — Inpatient Hospital Stay: Attending: Family Medicine

## 2023-06-27 NOTE — Telephone Encounter (Signed)
 Orders faxed, appt scheduled for tomorrow. Patient aware.

## 2023-06-28 ENCOUNTER — Encounter: Payer: Self-pay | Admitting: Family Medicine

## 2023-06-28 ENCOUNTER — Ambulatory Visit: Admitting: Family Medicine

## 2023-06-28 ENCOUNTER — Other Ambulatory Visit (HOSPITAL_BASED_OUTPATIENT_CLINIC_OR_DEPARTMENT_OTHER): Payer: Self-pay

## 2023-06-28 VITALS — BP 108/64 | HR 88 | Temp 97.8°F | Resp 16 | Ht 65.0 in | Wt 205.2 lb

## 2023-06-28 DIAGNOSIS — M25472 Effusion, left ankle: Secondary | ICD-10-CM | POA: Diagnosis not present

## 2023-06-28 DIAGNOSIS — M25572 Pain in left ankle and joints of left foot: Secondary | ICD-10-CM

## 2023-06-28 LAB — URIC ACID: Uric Acid: 4.1 mg/dL (ref 3.0–7.2)

## 2023-06-28 LAB — CBC WITH DIFFERENTIAL/PLATELET
Basophils Absolute: 0.1 10*3/uL (ref 0.0–0.2)
Basos: 1 %
EOS (ABSOLUTE): 0.1 10*3/uL (ref 0.0–0.4)
Eos: 1 %
Hematocrit: 41.3 % (ref 34.0–46.6)
Hemoglobin: 12.6 g/dL (ref 11.1–15.9)
Immature Grans (Abs): 0 10*3/uL (ref 0.0–0.1)
Immature Granulocytes: 0 %
Lymphocytes Absolute: 1.7 10*3/uL (ref 0.7–3.1)
Lymphs: 15 %
MCH: 26.3 pg — ABNORMAL LOW (ref 26.6–33.0)
MCHC: 30.5 g/dL — ABNORMAL LOW (ref 31.5–35.7)
MCV: 86 fL (ref 79–97)
Monocytes Absolute: 0.7 10*3/uL (ref 0.1–0.9)
Monocytes: 7 %
Neutrophils Absolute: 8.6 10*3/uL — ABNORMAL HIGH (ref 1.4–7.0)
Neutrophils: 76 %
Platelets: 287 10*3/uL (ref 150–450)
RBC: 4.79 x10E6/uL (ref 3.77–5.28)
RDW: 13.8 % (ref 11.7–15.4)
WBC: 11.3 10*3/uL — ABNORMAL HIGH (ref 3.4–10.8)

## 2023-06-28 LAB — SPECIMEN STATUS REPORT

## 2023-06-28 MED ORDER — INDOMETHACIN ER 75 MG PO CPCR
75.0000 mg | ORAL_CAPSULE | Freq: Two times a day (BID) | ORAL | 0 refills | Status: DC
  Filled 2023-06-28: qty 60, 30d supply, fill #0

## 2023-06-28 MED ORDER — CEPHALEXIN 500 MG PO CAPS
500.0000 mg | ORAL_CAPSULE | Freq: Three times a day (TID) | ORAL | 0 refills | Status: DC
  Filled 2023-06-28: qty 21, 7d supply, fill #0

## 2023-06-28 NOTE — Assessment & Plan Note (Signed)
 Awaiting labs.  Start on indomethacin ER twice daily.  Check ankle xray.  Labs came back this evening. Uric acid normal.  Wbc elevated.

## 2023-06-28 NOTE — Assessment & Plan Note (Signed)
 Opted to treat for cellulitis.  Recommend get doppler US of left ankle

## 2023-06-28 NOTE — Progress Notes (Signed)
 Acute Office Visit  Subjective:    Patient ID: Paige Hardy, female    DOB: 1965-06-21, 58 y.o.   MRN: [id number]  Chief Complaint  Patient presents with   Joint Pain   Discussed the use of AI scribe software for clinical note transcription with the patient, who gave verbal consent to proceed.    HPI: Patient is in today for ankle swelling and pain. Started last Feb 24th. Patient states she has propped it up on pillows and applied ice and nothing seems to help.  The patient, with a history of acid reflux, presents with a swollen foot that started suddenly the previous Monday. The swelling is localized to one foot and has gradually worsened, with increased pain noted on the day of the visit. The patient denies any known injury or precipitating event and has not had a similar episode in the past. The patient has tried ibuprofen and ice application with minimal relief.      Past Medical History:  Diagnosis Date   Essential hypertension    Mixed hyperlipidemia    Vitamin D deficiency     Past Surgical History:  Procedure Laterality Date   ANKLE ARTHROSCOPY Right 04/15/2022   Procedure: RIGHT ANKLE ARTHROSCOPY;  Surgeon: [name], MD;  Location: [facility] OR;  Service: Orthopedics;  Laterality: Right;   ANKLE FRACTURE SURGERY Right    CHOLECYSTECTOMY     HARDWARE REMOVAL Right 04/15/2022   Procedure: HARDWARE REMOVAL RIGHT ANKLE;  Surgeon: [name], MD;  Location: [facility] OR;  Service: Orthopedics;  Laterality: Right;    Family History  Problem Relation Age of Onset   Hypertension Other    Prostate cancer Father    Hypertension Maternal Grandfather    Diabetes Maternal Grandfather     Social History   Socioeconomic History   Marital status: Married    Spouse name: Not on file   Number of children: Not on file   Years of education: Not on file   Highest education level: Not on file  Occupational History   Not on file  Tobacco Use   Smoking status: Never    Smokeless tobacco: Never  Vaping Use   Vaping status: Never Used  Substance and Sexual Activity   Alcohol use: Yes    Comment: socially   Drug use: Never   Sexual activity: Not on file  Other Topics Concern   Not on file  Social History Narrative   Not on file   Social Drivers of Health   Financial Resource Strain: Low Risk  (11/07/2022)   Overall Financial Resource Strain (CARDIA)    Difficulty of Paying Living Expenses: Not hard at all  Food Insecurity: No Food Insecurity (11/07/2022)   Hunger Vital Sign    Worried About Running Out of Food in the Last Year: Never true    Ran Out of Food in the Last Year: Never true  Transportation Needs: No Transportation Needs (11/07/2022)   PRAPARE - Administrator, Civil Service (Medical): No    Lack of Transportation (Non-Medical): No  Physical Activity: Insufficiently Active (11/07/2022)   Exercise Vital Sign    Days of Exercise per Week: 2 days    Minutes of Exercise per Session: 10 min  Stress: No Stress Concern Present (11/07/2022)   [employer] of Occupational Health - Occupational Stress Questionnaire    Feeling of Stress : Only a little  Social Connections: Socially Integrated (11/07/2022)   Social Connection and Isolation Panel [  NHANES]    Frequency of Communication with Friends and Family: More than three times a week    Frequency of Social Gatherings with Friends and Family: More than three times a week    Attends Religious Services: More than 4 times per year    Active Member of [employer] or Organizations: Yes    Attends Engineer, structural: More than 4 times per year    Marital Status: Married  Catering manager Violence: Not At Risk (11/07/2022)   Humiliation, Afraid, Rape, and Kick questionnaire    Fear of Current or Ex-Partner: No    Emotionally Abused: No    Physically Abused: No    Sexually Abused: No    Outpatient Medications Prior to Visit  Medication Sig Dispense Refill   atorvastatin  (LIPITOR) 20 MG tablet Take 1 tablet (20 mg total) by mouth daily. Patient needs an appointment 90 tablet 0   cholecalciferol (VITAMIN D3) 25 MCG (1000 UNIT) tablet Take 1,000 Units by mouth daily.     diphenhydrAMINE (BENADRYL) 25 MG tablet Take 12.5 mg by mouth every 6 (six) hours as needed.     losartan (COZAAR) 50 MG tablet Take 1 tablet (50 mg total) by mouth daily. Patient needs a fasting follow up appointment 90 tablet 0   Multiple Vitamins-Minerals (MULTIVITAMIN ADULT) CHEW Chew 2 each by mouth daily.     promethazine-dextromethorphan (PROMETHAZINE-DM) 6.25-15 MG/5ML syrup Take 5 mLs by mouth at bedtime as needed for cough. (Patient not taking: Reported on 06/28/2023) 118 mL 0   valACYclovir (VALTREX) 500 MG tablet Take 1 tablet (500 mg total) by mouth 2 (two) times daily. (Patient not taking: Reported on 04/13/2022) 20 tablet 2   amoxicillin-clavulanate (AUGMENTIN) 875-125 MG tablet Take 1 tablet by mouth 2 (two) times daily. 20 tablet 0   Ascorbic Acid (VITAMIN C) 1000 MG tablet Take 1,000 mg by mouth daily.     benzonatate (TESSALON) 100 MG capsule Take 1 capsule (100 mg total) by mouth every 8 (eight) hours. 21 capsule 0   predniSONE (DELTASONE) 10 MG tablet 6 tabs day 1, 5 tabs day 2, 4 tabs day 3, 3 tabs day 4, 2 tabs day 5, 1 tab day 6, then off 21 tablet 0   No facility-administered medications prior to visit.    No Known Allergies  Review of Systems  Constitutional:  Negative for appetite change, fatigue and fever.  HENT:  Negative for congestion, ear pain, sinus pressure and sore throat.   Respiratory:  Negative for cough, chest tightness, shortness of breath and wheezing.   Cardiovascular:  Negative for chest pain and palpitations.  Gastrointestinal:  Negative for abdominal pain, constipation, diarrhea, nausea and vomiting.  Genitourinary:  Negative for dysuria and hematuria.  Musculoskeletal:  Negative for arthralgias, back pain, joint swelling and myalgias.  Skin:   Negative for rash.  Neurological:  Negative for dizziness, weakness and headaches.  Psychiatric/Behavioral:  Negative for dysphoric mood. The patient is not nervous/anxious.        Objective:        06/28/2023    9:34 AM 04/05/2023   12:01 PM 04/05/2023   11:59 AM  Vitals with BMI  Height 5\' 5"     Weight 205 lbs 3 oz 205 lbs   BMI 34.15    Systolic 108  156  Diastolic 64  107  Pulse 88  94    No data found.   Physical Exam Vitals reviewed.  Constitutional:      Appearance: Normal  appearance.  Musculoskeletal:        General: Swelling (left ankle.) and tenderness (left ankle) present. No deformity or signs of injury. Normal range of motion.     Left lower leg: Edema present.  Skin:    General: Skin is warm.     Findings: No erythema.     Comments: Warm over left ankle.   Right ankle normal.  Neurological:     Mental Status: She is alert.     Health Maintenance Due  Topic Date Due   HIV Screening  Never done   Hepatitis C Screening  Never done   Zoster Vaccines- Shingrix (1 of 2) Never done   COVID-19 Vaccine (3 - 2024-25 season) 12/25/2022    There are no preventive care reminders to display for this patient.   Lab Results  Component Value Date   TSH 2.432 11/08/2022   Lab Results  Component Value Date   WBC 11.3 (H) 06/27/2023   HGB 12.6 06/27/2023   HCT 41.3 06/27/2023   MCV 86 06/27/2023   PLT 287 06/27/2023   Lab Results  Component Value Date   NA 137 11/08/2022   K 3.7 11/08/2022   CO2 26 11/08/2022   GLUCOSE 99 11/08/2022   BUN 14 11/08/2022   CREATININE 0.99 11/08/2022   BILITOT 0.6 11/08/2022   ALKPHOS 88 11/08/2022   AST 25 11/08/2022   ALT 27 11/08/2022   PROT 7.5 11/08/2022   ALBUMIN 4.0 11/08/2022   CALCIUM 8.6 (L) 11/08/2022   ANIONGAP 9 11/08/2022   EGFR 61 10/21/2022   Lab Results  Component Value Date   CHOL 154 11/08/2022   Lab Results  Component Value Date   HDL 45 11/08/2022   Lab Results  Component Value  Date   LDLCALC 91 11/08/2022   Lab Results  Component Value Date   TRIG 92 11/08/2022   Lab Results  Component Value Date   CHOLHDL 3.4 11/08/2022   Lab Results  Component Value Date   HGBA1C 5.4 05/25/2020       Assessment & Plan:  Acute left ankle pain -     Indomethacin ER; Take 1 capsule (75 mg total) by mouth 2 (two) times daily with a meal.  Dispense: 60 capsule; Refill: 0 -     DG Ankle Complete Left; Future       Meds ordered this encounter  Medications   indomethacin (INDOCIN SR) 75 MG CR capsule    Sig: Take 1 capsule (75 mg total) by mouth 2 (two) times daily with a meal.    Dispense:  60 capsule    Refill:  0    Orders Placed This Encounter  Procedures   DG Ankle Complete Left     Follow-up: No follow-ups on file.  An After Visit Summary was printed and given to the patient.  I attest that I have reviewed this visit and agree with the plan scribed by my staff.   Blane Ohara, MD [facility] [phone]

## 2023-06-29 ENCOUNTER — Other Ambulatory Visit: Payer: Self-pay | Admitting: Family Medicine

## 2023-06-29 ENCOUNTER — Ambulatory Visit: Payer: Self-pay | Admitting: Family Medicine

## 2023-06-29 ENCOUNTER — Other Ambulatory Visit (HOSPITAL_BASED_OUTPATIENT_CLINIC_OR_DEPARTMENT_OTHER): Payer: Self-pay

## 2023-06-29 DIAGNOSIS — M25472 Effusion, left ankle: Secondary | ICD-10-CM

## 2023-06-29 DIAGNOSIS — M25572 Pain in left ankle and joints of left foot: Secondary | ICD-10-CM

## 2023-06-29 MED ORDER — APIXABAN (ELIQUIS) VTE STARTER PACK (10MG AND 5MG)
ORAL_TABLET | ORAL | 0 refills | Status: DC
Start: 2023-06-29 — End: 2023-09-26
  Filled 2023-06-29: qty 74, 30d supply, fill #0

## 2023-06-29 NOTE — Telephone Encounter (Signed)
 Copied from CRM [id number]. Topic: Clinical - Medical Advice >> Jun 29, 2023  1:41 PM Marland Kitchen D wrote: Patient has a appointment July 12, 2023 and wants to know if it should be sooner due to DVT. Contact patient at off number [phone] she's there until 4:30pm   Additional Notes: Attempted to contact patient for triage, no answer at this time. Left a voicemail.

## 2023-06-29 NOTE — Telephone Encounter (Signed)
    Chief Complaint: Pt. Was calling in regard to appointment for ultrasound. Has talked to PCP and will have ultrasound tomorrow. No triage needed. Symptoms: n/a Frequency: n/a Pertinent Negatives: Patient denies  Disposition: [] ED /[] Urgent Care (no appt availability in office) / [] Appointment(In office/virtual)/ []  [facility]/ [] Home Care/ [] Refused Recommended Disposition /[] [facility]/ [x]  Follow-up with PCP Additional Notes: PCP can call husband's phone if she needs to reach pt.  Reason for Disposition  [1] Other NON-URGENT information for PCP AND [2] does not require PCP response  Answer Assessment - Initial Assessment Questions 1. REASON FOR CALL or QUESTION: "What is your reason for calling today?" or "How can I best help you?" or "What question do you have that I can help answer?"     Pt. Has talked with PCP today about ultrasound she is having tomorrow. 2. CALLER: Document the source of call. (e.g., laboratory, patient).     Patient  Protocols used: PCP Call - No Triage-A-AH

## 2023-06-30 ENCOUNTER — Encounter: Payer: Self-pay | Admitting: Family Medicine

## 2023-06-30 ENCOUNTER — Ambulatory Visit (HOSPITAL_BASED_OUTPATIENT_CLINIC_OR_DEPARTMENT_OTHER)
Admission: RE | Admit: 2023-06-30 | Discharge: 2023-06-30 | Disposition: A | Discharge status: Home / Self Care | Source: Ambulatory Visit | Attending: Family Medicine | Admitting: Family Medicine

## 2023-06-30 DIAGNOSIS — M7989 Other specified soft tissue disorders: Secondary | ICD-10-CM | POA: Diagnosis not present

## 2023-06-30 DIAGNOSIS — R600 Localized edema: Secondary | ICD-10-CM | POA: Diagnosis not present

## 2023-06-30 DIAGNOSIS — M25572 Pain in left ankle and joints of left foot: Secondary | ICD-10-CM | POA: Diagnosis not present

## 2023-06-30 DIAGNOSIS — M25472 Effusion, left ankle: Secondary | ICD-10-CM

## 2023-07-06 ENCOUNTER — Other Ambulatory Visit (HOSPITAL_BASED_OUTPATIENT_CLINIC_OR_DEPARTMENT_OTHER): Payer: Self-pay

## 2023-07-06 DIAGNOSIS — T8484XA Pain due to internal orthopedic prosthetic devices, implants and grafts, initial encounter: Secondary | ICD-10-CM | POA: Diagnosis not present

## 2023-07-06 DIAGNOSIS — M19171 Post-traumatic osteoarthritis, right ankle and foot: Secondary | ICD-10-CM | POA: Diagnosis not present

## 2023-07-06 DIAGNOSIS — M7662 Achilles tendinitis, left leg: Secondary | ICD-10-CM | POA: Diagnosis not present

## 2023-07-06 MED ORDER — MELOXICAM 7.5 MG PO TABS
7.5000 mg | ORAL_TABLET | Freq: Two times a day (BID) | ORAL | 0 refills | Status: DC
  Filled 2023-07-06: qty 60, 30d supply, fill #0

## 2023-07-12 ENCOUNTER — Encounter

## 2023-07-12 ENCOUNTER — Other Ambulatory Visit: Payer: Self-pay

## 2023-07-13 ENCOUNTER — Other Ambulatory Visit: Payer: Self-pay

## 2023-07-17 ENCOUNTER — Other Ambulatory Visit: Payer: Self-pay

## 2023-07-24 DIAGNOSIS — M7662 Achilles tendinitis, left leg: Secondary | ICD-10-CM | POA: Diagnosis not present

## 2023-07-24 DIAGNOSIS — M25572 Pain in left ankle and joints of left foot: Secondary | ICD-10-CM | POA: Diagnosis not present

## 2023-07-24 DIAGNOSIS — R262 Difficulty in walking, not elsewhere classified: Secondary | ICD-10-CM | POA: Diagnosis not present

## 2023-07-28 ENCOUNTER — Other Ambulatory Visit: Payer: Self-pay

## 2023-08-03 DIAGNOSIS — M25572 Pain in left ankle and joints of left foot: Secondary | ICD-10-CM | POA: Diagnosis not present

## 2023-08-03 DIAGNOSIS — R262 Difficulty in walking, not elsewhere classified: Secondary | ICD-10-CM | POA: Diagnosis not present

## 2023-08-03 DIAGNOSIS — M7662 Achilles tendinitis, left leg: Secondary | ICD-10-CM | POA: Diagnosis not present

## 2023-08-08 DIAGNOSIS — R262 Difficulty in walking, not elsewhere classified: Secondary | ICD-10-CM | POA: Diagnosis not present

## 2023-08-08 DIAGNOSIS — M7662 Achilles tendinitis, left leg: Secondary | ICD-10-CM | POA: Diagnosis not present

## 2023-08-08 DIAGNOSIS — M25572 Pain in left ankle and joints of left foot: Secondary | ICD-10-CM | POA: Diagnosis not present

## 2023-08-15 IMAGING — MG MM DIGITAL SCREENING BILAT W/ TOMO AND CAD
6 of 10 series · 6 of 30 positions shown · non-contrast
Comparison: Previous exam(s).

CLINICAL DATA: Screening.

EXAM:
DIGITAL SCREENING BILATERAL MAMMOGRAM WITH TOMOSYNTHESIS AND CAD
TECHNIQUE: Bilateral screening digital craniocaudal and mediolateral oblique
mammograms were obtained. Bilateral screening digital breast
tomosynthesis was performed. The images were evaluated with
computer-aided detection.

[L CC synth-2D]
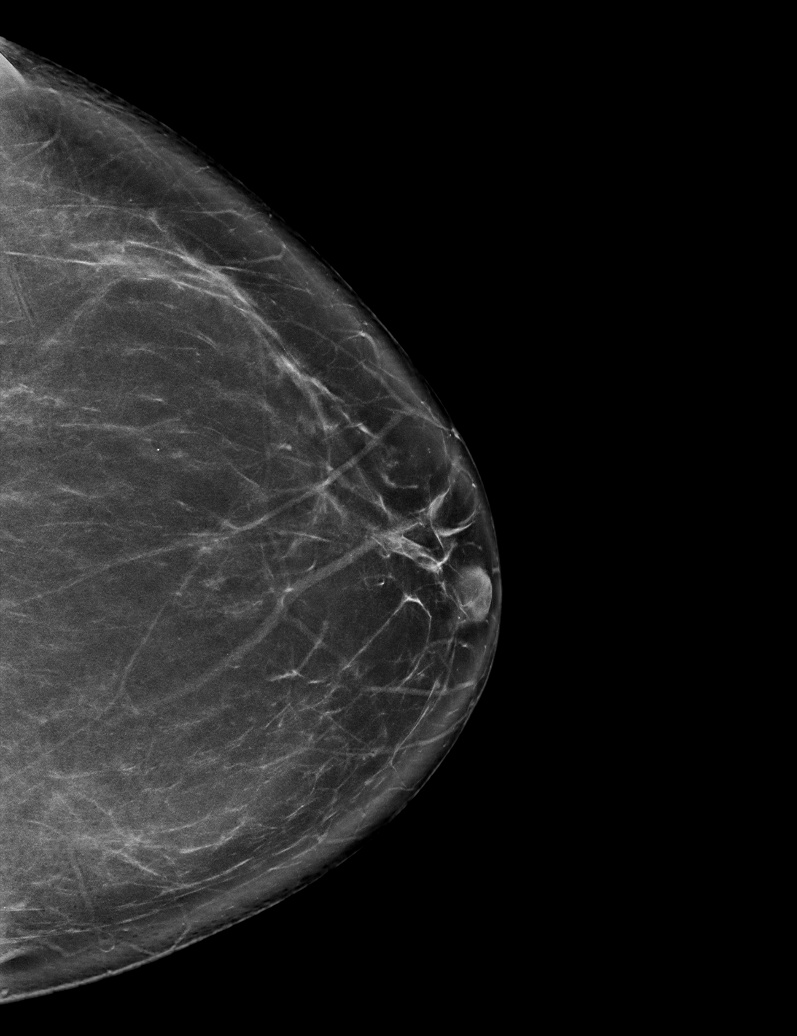

[R MLO synth-2D (1 of 2)]
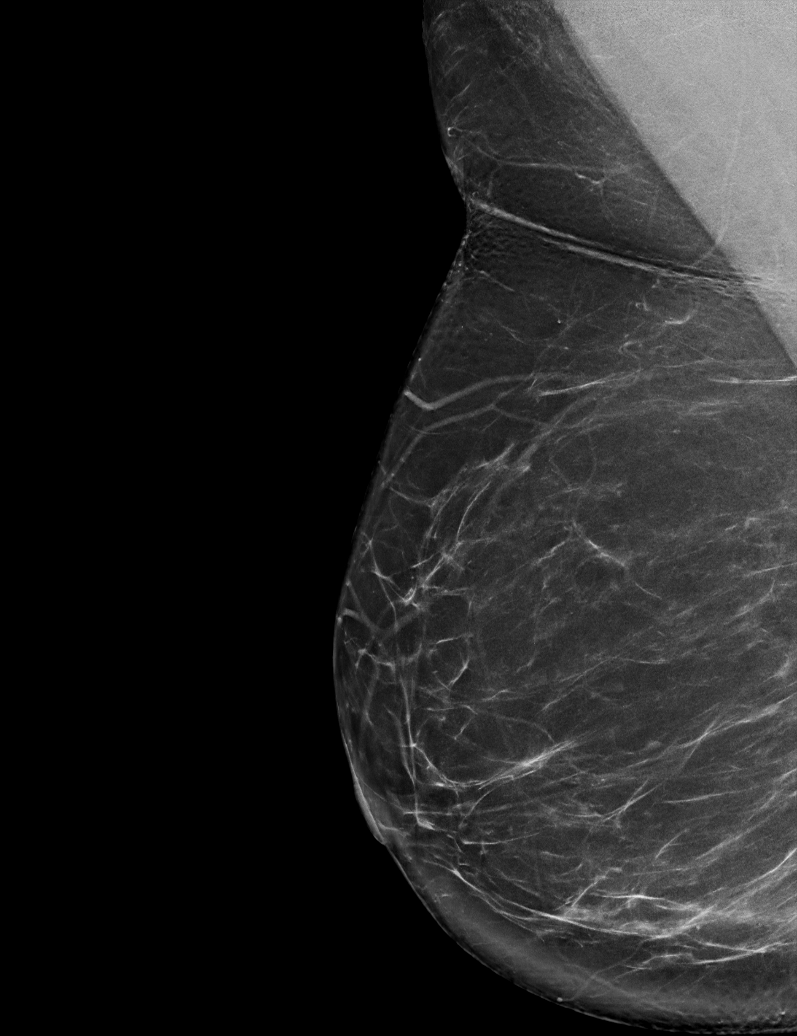

[L MLO synth-2D]
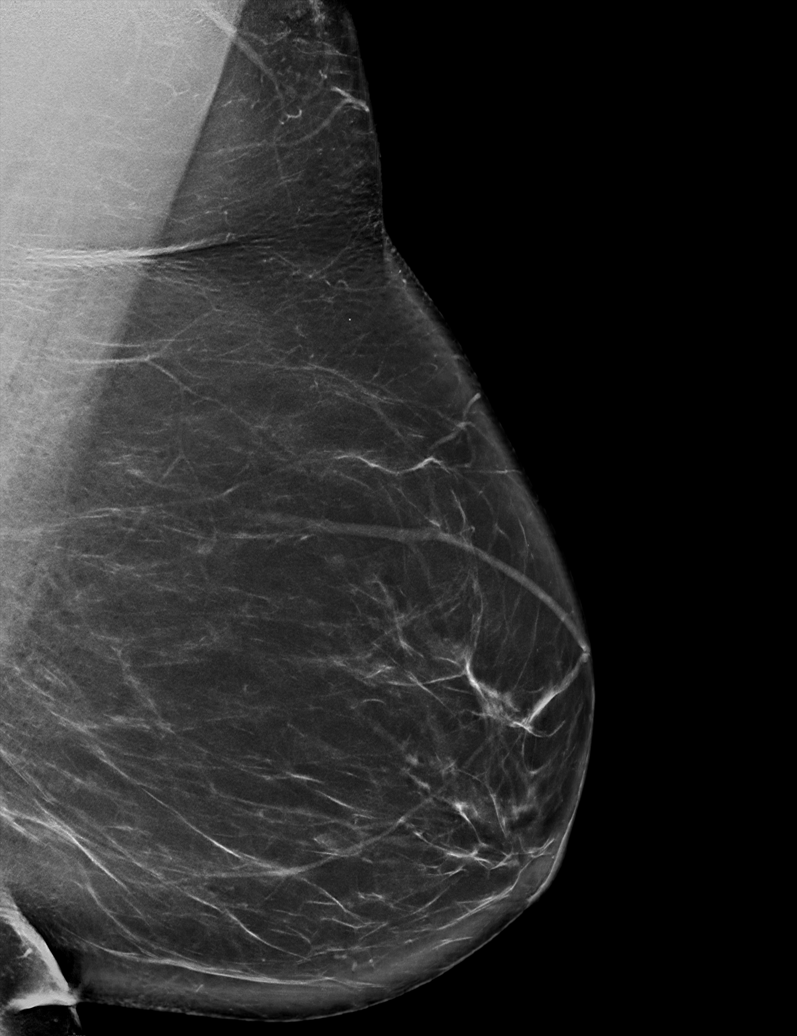

[R MLO synth-2D (2 of 2)]
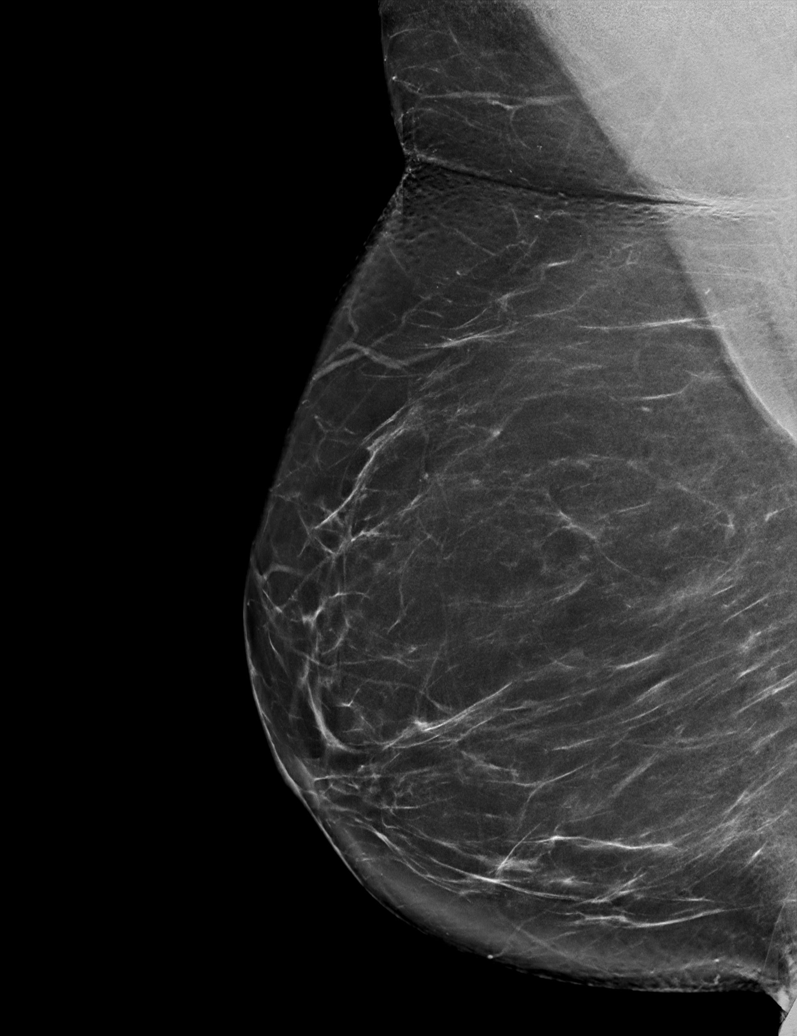

[R CC synth-2D]
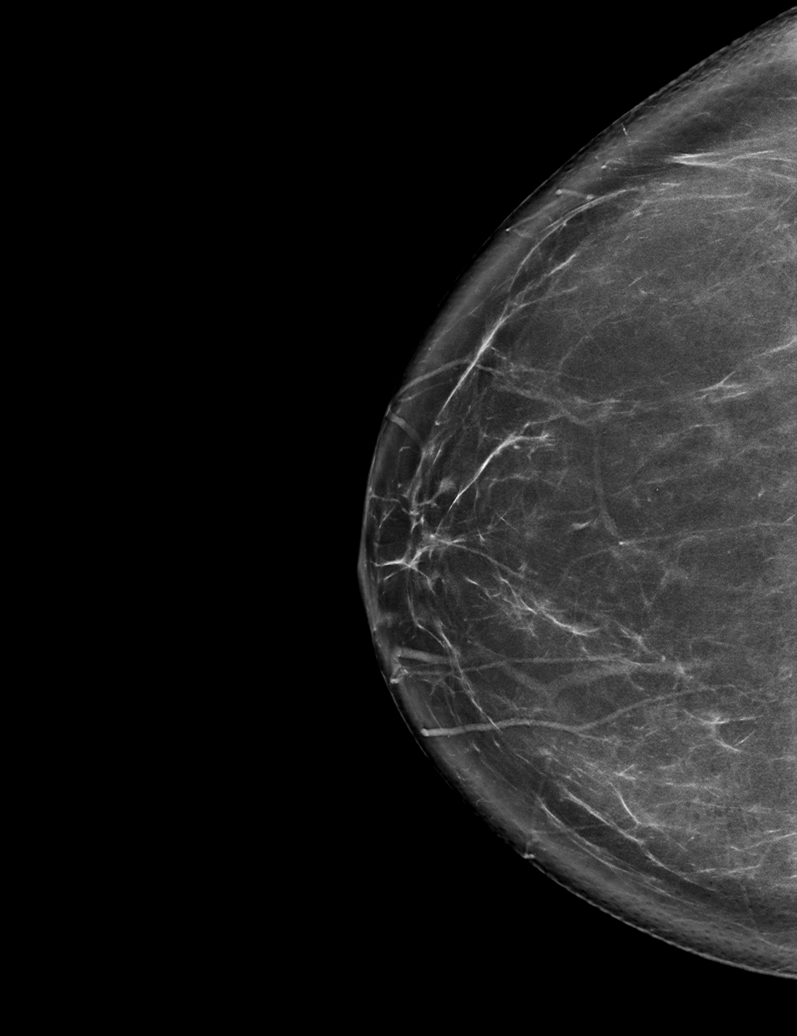

[L MLO tomo · tomo slice 43/84.0]
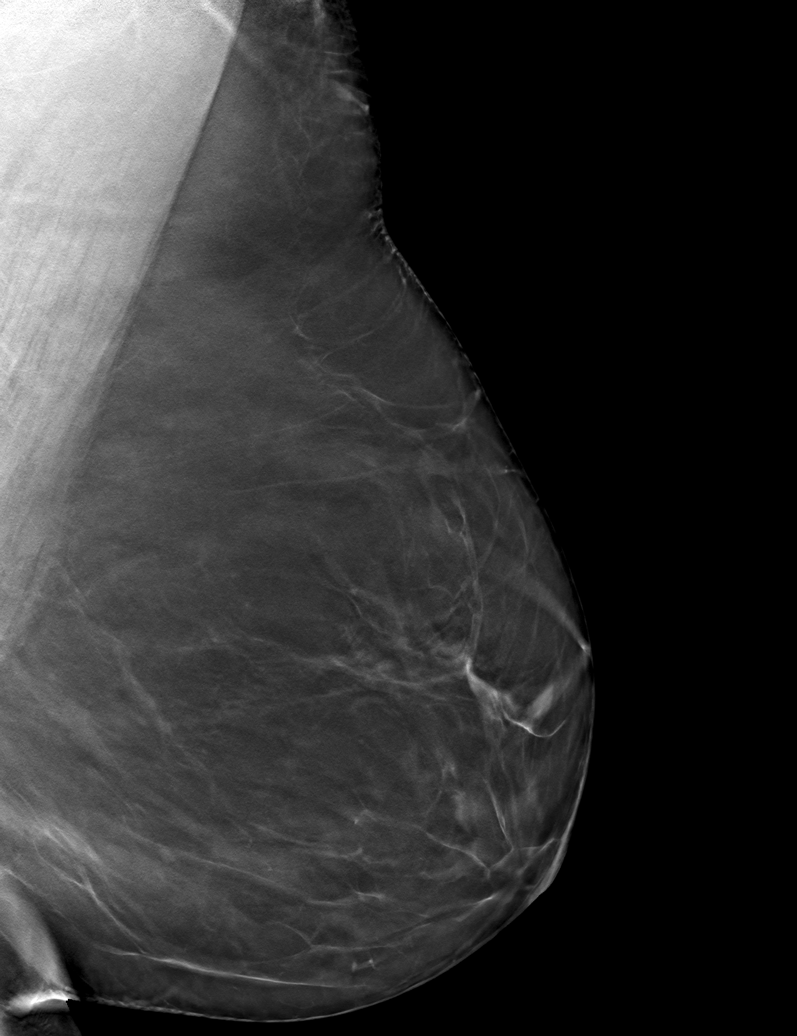

[6 of 30 positions shown; findings below may reference images not displayed]

ACR Breast Density Category b: There are scattered areas of
fibroglandular density.
FINDINGS: There are no findings suspicious for malignancy.
IMPRESSION: No mammographic evidence of malignancy. A result letter of this
screening mammogram will be mailed directly to the patient.

RECOMMENDATION:
Screening mammogram in one year. (Code:51-O-LD2)

BI-RADS CATEGORY  1: Negative.

## 2023-08-16 DIAGNOSIS — R262 Difficulty in walking, not elsewhere classified: Secondary | ICD-10-CM | POA: Diagnosis not present

## 2023-08-16 DIAGNOSIS — M25572 Pain in left ankle and joints of left foot: Secondary | ICD-10-CM | POA: Diagnosis not present

## 2023-08-16 DIAGNOSIS — M7662 Achilles tendinitis, left leg: Secondary | ICD-10-CM | POA: Diagnosis not present

## 2023-08-18 ENCOUNTER — Other Ambulatory Visit: Payer: Self-pay | Admitting: Gastroenterology

## 2023-08-26 ENCOUNTER — Other Ambulatory Visit
Admission: RE | Admit: 2023-08-26 | Discharge: 2023-08-26 | Disposition: A | Discharge status: Home / Self Care | Source: Ambulatory Visit | Attending: Primary Care | Admitting: Primary Care

## 2023-08-26 DIAGNOSIS — E782 Mixed hyperlipidemia: Secondary | ICD-10-CM | POA: Insufficient documentation

## 2023-08-26 DIAGNOSIS — E1121 Type 2 diabetes mellitus with diabetic nephropathy: Secondary | ICD-10-CM | POA: Insufficient documentation

## 2023-08-26 DIAGNOSIS — I10 Essential (primary) hypertension: Secondary | ICD-10-CM | POA: Insufficient documentation

## 2023-08-26 LAB — T4, FREE: Free T4: 1.2 ng/dL (ref 0.9–1.7)

## 2023-08-26 LAB — COMPREHENSIVE METABOLIC PANEL
ALT: 21 U/L (ref 0–35)
AST: 20 U/L (ref 0–35)
Albumin: 4.2 g/dL (ref 3.5–5.2)
Alk Phos: 151 U/L — ABNORMAL HIGH (ref 35–105)
Anion Gap: 10 (ref 7–16)
Bilirubin,Total: 0.4 mg/dL (ref 0.0–1.2)
CO2: 27 mmol/L (ref 20–28)
Calcium: 9.6 mg/dL (ref 8.6–10.2)
Chloride: 104 mmol/L (ref 96–108)
Creatinine: 0.69 mg/dL (ref 0.51–0.95)
Glucose: 96 mg/dL (ref 60–99)
Lab: 10 mg/dL (ref 6–20)
Potassium: 4.9 mmol/L (ref 3.3–5.1)
Sodium: 141 mmol/L (ref 133–145)
Total Protein: 7.3 g/dL (ref 6.3–7.7)
eGFR BY CREAT: 102 *

## 2023-08-26 LAB — TSH: TSH: 1.59 u[IU]/mL (ref 0.27–4.20)

## 2023-08-26 LAB — LIPID PANEL
Chol/HDL Ratio: 3.7
Cholesterol: 150 mg/dL
HDL: 41 mg/dL (ref 40–60)
LDL Calculated: 92 mg/dL
Non HDL Cholesterol: 109 mg/dL
Triglycerides: 90 mg/dL

## 2023-08-26 LAB — CBC AND DIFFERENTIAL
Baso # K/uL: 0 10*3/uL (ref 0.0–0.2)
Eos # K/uL: 0.3 10*3/uL (ref 0.0–0.5)
Hematocrit: 41 % (ref 34–49)
Hemoglobin: 13.1 g/dL (ref 11.2–16.0)
Lymph # K/uL: 2.7 10*3/uL (ref 1.0–5.0)
MCV: 87 fL (ref 75–100)
Mono # K/uL: 0.6 10*3/uL (ref 0.1–1.0)
Neut # K/uL: 4.7 10*3/uL (ref 1.5–6.5)
Platelets: 320 10*3/uL (ref 150–450)
RBC: 4.8 MIL/uL (ref 4.0–5.5)
RDW: 14 % (ref 0.0–15.0)
Seg Neut %: 56.4 %
WBC: 8.4 10*3/uL (ref 3.5–11.0)

## 2023-08-27 LAB — HEMOGLOBIN A1C: Hemoglobin A1C: 5.6 %

## 2023-09-12 ENCOUNTER — Other Ambulatory Visit (HOSPITAL_BASED_OUTPATIENT_CLINIC_OR_DEPARTMENT_OTHER): Payer: Self-pay

## 2023-09-12 ENCOUNTER — Other Ambulatory Visit: Payer: Self-pay | Admitting: Physician Assistant

## 2023-09-12 MED ORDER — ATORVASTATIN CALCIUM 20 MG PO TABS
20.0000 mg | ORAL_TABLET | Freq: Every day | ORAL | 0 refills | Status: DC
  Filled 2023-09-12: qty 90, 90d supply, fill #0

## 2023-09-12 MED ORDER — LOSARTAN POTASSIUM 50 MG PO TABS
50.0000 mg | ORAL_TABLET | Freq: Every day | ORAL | 0 refills | Status: DC
  Filled 2023-09-12: qty 90, 90d supply, fill #0

## 2023-09-26 ENCOUNTER — Other Ambulatory Visit (HOSPITAL_BASED_OUTPATIENT_CLINIC_OR_DEPARTMENT_OTHER): Payer: Self-pay

## 2023-09-26 ENCOUNTER — Ambulatory Visit (HOSPITAL_BASED_OUTPATIENT_CLINIC_OR_DEPARTMENT_OTHER)
Admission: EM | Admit: 2023-09-26 | Discharge: 2023-09-26 | Disposition: A | Discharge status: Home / Self Care | Attending: Family Medicine | Admitting: Family Medicine

## 2023-09-26 ENCOUNTER — Encounter (HOSPITAL_BASED_OUTPATIENT_CLINIC_OR_DEPARTMENT_OTHER): Payer: Self-pay

## 2023-09-26 ENCOUNTER — Other Ambulatory Visit: Payer: Self-pay

## 2023-09-26 DIAGNOSIS — H60392 Other infective otitis externa, left ear: Secondary | ICD-10-CM

## 2023-09-26 DIAGNOSIS — R591 Generalized enlarged lymph nodes: Secondary | ICD-10-CM | POA: Diagnosis not present

## 2023-09-26 MED ORDER — CIPROFLOXACIN-DEXAMETHASONE 0.3-0.1 % OT SUSP
4.0000 [drp] | Freq: Two times a day (BID) | OTIC | 0 refills | Status: AC
Start: 2023-09-26 — End: 2023-10-15
  Filled 2023-09-26: qty 7.5, 19d supply, fill #0

## 2023-09-26 NOTE — ED Provider Notes (Signed)
 [facility]    CSN: [id number] Arrival date & time: 09/26/23  1132      History   Chief Complaint Chief Complaint  Patient presents with   Otalgia    HPI Paige Hardy is a 58 y.o. female.   58 year old female presents today with ear congestion, itching and drainage.  Symptoms started approximate 1 week ago.  She denies a swollen area to the posterior scalp.  Also some scabs of the scalp area.  Ears are very itchy.  No fevers, chills, cough, congestion or bodyaches.   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   Acute infection of nasal sinus 03/17/2021   Colon cancer screening 11/20/2019   Class 1 obesity due to excess calories with serious comorbidity and body mass index (BMI) of 30.0 to 30.9 in adult 08/20/2019   Essential hypertension    Mixed hyperlipidemia    Vitamin D  deficiency    PMB (postmenopausal bleeding) 11/07/2018   Irregular menstrual bleeding 05/15/2018    Past Surgical History:  Procedure Laterality Date   ANKLE ARTHROSCOPY Right 04/15/2022   Procedure: RIGHT ANKLE ARTHROSCOPY;  Surgeon: Timothy Ford, MD;  Location: [facility] OR;  Service: Orthopedics;  Laterality: Right;   ANKLE FRACTURE SURGERY Right    CHOLECYSTECTOMY     HARDWARE REMOVAL Right 04/15/2022   Procedure: HARDWARE REMOVAL RIGHT ANKLE;  Surgeon: Timothy Ford, MD;  [facility];  Service: Orthopedics;  Laterality: Right;    OB History   No obstetric history on file.      Home  Medications    Prior to Admission medications   Medication Sig Start Date End Date Taking? Authorizing Provider  ciprofloxacin-dexamethasone  (CIPRODEX) OTIC suspension Place 4 drops into both ears 2 (two) times daily for 7 days. 09/26/23 10/15/23 Yes Vannia Pola A, FNP  atorvastatin  (LIPITOR) 20 MG tablet Take 1 tablet (20 mg total) by mouth daily. Patient needs an appointment 09/12/23   Cox, Burleigh Carp, MD  cholecalciferol (VITAMIN D3) 25 MCG (1000 UNIT) tablet Take 1,000 Units by mouth daily.    [provider]  diphenhydrAMINE (BENADRYL) 25 MG tablet Take 12.5 mg by mouth every 6 (six) hours as needed.    [provider]  losartan  (COZAAR ) 50 MG tablet Take 1 tablet (50 mg total) by mouth daily. Patient needs a fasting follow up appointment 09/12/23 12/11/23  Mercy Stall, MD  meloxicam  (MOBIC ) 7.5 MG tablet Take 1 tablet (7.5 mg total) by mouth 2 (two) times daily for 2 weeks and then as needed 07/06/23     Multiple Vitamins-Minerals (MULTIVITAMIN ADULT) CHEW Chew 2 each by mouth daily.    [provider]    Family History Family History  Problem Relation Age of Onset   Hypertension Other    Prostate cancer Father    Hypertension Maternal Grandfather    Diabetes Maternal Grandfather     Social History Social History   Tobacco Use   Smoking status: Never  Smokeless tobacco: Never  Vaping Use   Vaping status: Never Used  Substance Use Topics   Alcohol use: Yes    Comment: socially   Drug use: Never     Allergies   Patient has no known allergies.   Review of Systems Review of Systems  HENT:  Positive for ear pain.    See HPI   Physical Exam Triage Vital Signs ED Triage Vitals  Encounter Vitals Group     BP 09/26/23 1141 (!) 138/92     Systolic BP Percentile --      Diastolic BP Percentile --      Pulse Rate 09/26/23 1141 94     Resp 09/26/23 1141 20     Temp 09/26/23 1141 98.4 F (36.9 C)     Temp Source 09/26/23 1141 Oral     SpO2 --       Weight --      Height --      Head Circumference --      Peak Flow --      Pain Score 09/26/23 1143 0     Pain Loc --      Pain Education --      Exclude from Growth Chart --    No data found.  Updated Vital Signs BP (!) 138/92 (BP Location: Right Arm)   Pulse 94   Temp 98.4 F (36.9 C) (Oral)   Resp 20   Visual Acuity Right Eye Distance:   Left Eye Distance:   Bilateral Distance:    Right Eye Near:   Left Eye Near:    Bilateral Near:     Physical Exam Vitals and nursing note reviewed.  Constitutional:      General: She is not in acute distress.    Appearance: Normal appearance. She is not ill-appearing, toxic-appearing or diaphoretic.  HENT:     Head:     Comments: 2 scabs in posterior scalp area    Right Ear: Tympanic membrane, ear canal and external ear normal.     Left Ear: Decreased hearing noted. Drainage, swelling and tenderness present.     Ears:     Comments: Scaling and excoriations to external ear Neck:   Pulmonary:     Effort: Pulmonary effort is normal.  Neurological:     Mental Status: She is alert.  Psychiatric:        Mood and Affect: Mood normal.      UC Treatments / Results  Labs (all labs ordered are listed, but only abnormal results are displayed) Labs Reviewed - No data to display  EKG   Radiology No results found.  Procedures Procedures (including critical care time)  Medications Ordered in UC Medications - No data to display  Initial Impression / Assessment and Plan / UC Course  I have reviewed the triage vital signs and the nursing notes.  Pertinent labs & imaging results that were available during my care of the patient were reviewed by me and considered in my medical decision making (see chart for details).    Infective otitis media of left ear with lymphadenopathy.-Treating with Ciprodex at this time.  For the scaling and crusting on the external ear and scalp,  recommended doing eczema and triamcinolone   cream to  the areas. Follow-up for any continued worsening issues Final Clinical Impressions(s) / UC Diagnoses   Final diagnoses:  Infective otitis externa of left ear  Lymphadenopathy     Discharge Instructions      Use the  eardrops as prescribed.  Recommend doing some triamcinolone  cream or eczema cream on the outside of the ear. Follow-up as needed for any continued issues  ED Prescriptions     Medication Sig Dispense Auth. Provider   ciprofloxacin-dexamethasone  (CIPRODEX) OTIC suspension Place 4 drops into both ears 2 (two) times daily for 7 days. 7.5 mL Landa Pine, FNP      PDMP not reviewed this encounter.   Landa Pine, FNP 09/26/23 1520

## 2023-09-26 NOTE — ED Triage Notes (Signed)
 Head congestion and bilateral ear draining. Symptoms onset 1 week ago. States ears are itching.

## 2023-09-26 NOTE — Discharge Instructions (Signed)
 Use the eardrops as prescribed.  Recommend doing some triamcinolone  cream or eczema cream on the outside of the ear. Follow-up as needed for any continued issues

## 2023-09-26 NOTE — Progress Notes (Signed)
 Loretta [name]'s prior authorization for Stelara  pre-filled syringe will expire on 10/09/23 with a new prior authorization needed thereafter.     Medication name: Stelara  pre-filled syringe  Medication dose/details: 90 mg/mL SC every 12 weeks  Qty: 1 mL  Insurance: [place]  ID: [id number]    Thank you,  Billi Buddy, PharmD  [facility]  Phone: [phone]  Fax: [phone]

## 2023-10-02 ENCOUNTER — Other Ambulatory Visit: Payer: Self-pay

## 2023-10-02 DIAGNOSIS — L4050 Arthropathic psoriasis, unspecified: Secondary | ICD-10-CM

## 2023-10-05 ENCOUNTER — Other Ambulatory Visit: Payer: Self-pay

## 2023-10-05 ENCOUNTER — Other Ambulatory Visit: Payer: Self-pay | Admitting: Pharmacist

## 2023-10-09 ENCOUNTER — Other Ambulatory Visit: Payer: Self-pay

## 2023-10-10 ENCOUNTER — Telehealth: Payer: Self-pay | Admitting: Dermatology

## 2023-10-10 NOTE — Telephone Encounter (Signed)
 Copied from CRM [id number]. Topic: Appointments - Schedule Appointment  >> Oct 10, 2023 12:33 PM Meryl Acosta wrote:  Patient calling to schedule an appt unsure if she needs to see MD tausk and [place] pls call at [phone]

## 2023-10-10 NOTE — Telephone Encounter (Signed)
Called pt and left voice mail to confirm appointment.

## 2023-10-19 DIAGNOSIS — M7662 Achilles tendinitis, left leg: Secondary | ICD-10-CM | POA: Diagnosis not present

## 2023-10-19 DIAGNOSIS — T8484XA Pain due to internal orthopedic prosthetic devices, implants and grafts, initial encounter: Secondary | ICD-10-CM | POA: Diagnosis not present

## 2023-10-19 DIAGNOSIS — M19171 Post-traumatic osteoarthritis, right ankle and foot: Secondary | ICD-10-CM | POA: Diagnosis not present

## 2023-11-15 ENCOUNTER — Other Ambulatory Visit: Payer: Self-pay | Admitting: Gastroenterology

## 2023-11-16 NOTE — Progress Notes (Unsigned)
 Subjective:  Patient ID: Paige Hardy, female    DOB: [birth year]  Age: 58 y.o. MRN: [id number]  No chief complaint on file.   Well Adult Physical: Patient here for a comprehensive physical exam.The patient reports no problems Do you take any herbs or supplements that were not prescribed by a doctor? no Are you taking calcium  supplements? no Are you taking aspirin daily? no  Encounter for general adult medical examination without abnormal findings  Physical (At Risk items are starred): Patient's last physical exam was 1 year ago .  Patient is not afflicted from Stress Incontinence and Urge Incontinence  Patient wears a seat belts Patient has smoke detectors and has carbon monoxide detectors. Patient practices appropriate gun safety. Has guns.  Patient wears sunscreen with extended sun exposure. Dental Care: biannual cleanings, brushes and flosses daily. Ophthalmology/Optometry: Annual visit.  Hearing loss: none Vision impairments: readers  Menarche: 58 y.o Menstrual History: reg and heavy LMP: S/P Menopause Pregnancy history: 1 Safe at home: yes Self breast exams: yes Hot flashes all the time.   Discussed the use of AI scribe software for clinical note transcription with the patient, who gave verbal consent to proceed.  History of Present Illness           11/17/2023    7:44 AM 11/07/2022    3:48 PM 11/07/2022    3:43 PM 01/15/2021   10:10 AM 12/07/2020   10:05 AM  Depression screen PHQ 2/9  Decreased Interest 0 0 0 0 0  Down, Depressed, Hopeless 0 0 0 0 0  PHQ - 2 Score 0 0 0 0 0  Altered sleeping 0 1 1    Tired, decreased energy 2 1 1     Change in appetite 1 1 1     Feeling bad or failure about yourself  0 0 0    Trouble concentrating 0 0 0    Moving slowly or fidgety/restless 0 0 0    Suicidal thoughts 0 0 0    PHQ-9 Score 3 3 3     Difficult doing work/chores Not difficult at all Not difficult at all Not difficult at all           02/21/2020    7:57 AM  01/15/2021   10:11 AM 04/15/2022    6:28 AM 11/07/2022    3:43 PM 11/17/2023    7:44 AM  Fall Risk  Falls in the past year? 0 0  1 0  Was there an injury with Fall?  0  0 0  Fall Risk Category Calculator  0  1 0  Fall Risk Category (Retired)  Low      (RETIRED) Patient Fall Risk Level  Low fall risk  Low fall risk     Patient at Risk for Falls Due to    No Fall Risks No Fall Risks  Fall risk Follow up    Falls evaluation completed;Falls prevention discussed Falls evaluation completed     Data saved with a previous flowsheet row definition             Social Hx   Social History   Socioeconomic History   Marital status: Married    Spouse name: Not on file   Number of children: Not on file   Years of education: Not on file   Highest education level: Associate degree: academic program  Occupational History   Not on file  Tobacco Use   Smoking status: Never   Smokeless tobacco: Never  Vaping  Use   Vaping status: Never Used  Substance and Sexual Activity   Alcohol use: Yes    Comment: socially   Drug use: Never   Sexual activity: Not on file  Other Topics Concern   Not on file  Social History Narrative   Not on file   Social Drivers of Health   Financial Resource Strain: Low Risk  (11/13/2023)   Overall Financial Resource Strain (CARDIA)    Difficulty of Paying Living Expenses: Not hard at all  Food Insecurity: No Food Insecurity (11/13/2023)   Hunger Vital Sign    Worried About Running Out of Food in the Last Year: Never true    Ran Out of Food in the Last Year: Never true  Transportation Needs: No Transportation Needs (11/13/2023)   PRAPARE - Administrator, Civil Service (Medical): No    Lack of Transportation (Non-Medical): No  Physical Activity: Insufficiently Active (11/13/2023)   Exercise Vital Sign    Days of Exercise per Week: 3 days    Minutes of Exercise per Session: 20 min  Stress: No Stress Concern Present (11/13/2023)   [employer] of  Occupational Health - Occupational Stress Questionnaire    Feeling of Stress: Only a little  Social Connections: Socially Integrated (11/13/2023)   Social Connection and Isolation Panel    Frequency of Communication with Friends and Family: More than three times a week    Frequency of Social Gatherings with Friends and Family: Twice a week    Attends Religious Services: More than 4 times per year    Active Member of Clubs or Organizations: Yes    Attends Engineer, structural: More than 4 times per year    Marital Status: Married   Past Medical History:  Diagnosis Date   Essential hypertension    Mixed hyperlipidemia    Vitamin D  deficiency    Past Surgical History:  Procedure Laterality Date   ANKLE ARTHROSCOPY Right 04/15/2022   Procedure: RIGHT ANKLE ARTHROSCOPY;  Surgeon: Harden Jerona GAILS, MD;  Location: [facility] OR;  Service: Orthopedics;  Laterality: Right;   ANKLE FRACTURE SURGERY Right    CHOLECYSTECTOMY     HARDWARE REMOVAL Right 04/15/2022   Procedure: HARDWARE REMOVAL RIGHT ANKLE;  Surgeon: Harden Jerona GAILS, MD;  Location: [facility] OR;  Service: Orthopedics;  Laterality: Right;    Family History  Problem Relation Age of Onset   Hypertension Other    Prostate cancer Father    Hypertension Maternal Grandfather    Diabetes Maternal Grandfather     Review of Systems  Constitutional:  Negative for chills, fatigue and fever.  HENT:  Negative for congestion, ear pain, rhinorrhea and sore throat.   Respiratory:  Negative for cough and shortness of breath.   Cardiovascular:  Negative for chest pain.  Gastrointestinal:  Negative for abdominal pain, constipation, diarrhea, nausea and vomiting.  Genitourinary:  Negative for dysuria and urgency.  Musculoskeletal:  Negative for back pain and myalgias.  Neurological:  Negative for dizziness, weakness, light-headedness and headaches.  Psychiatric/Behavioral:  Negative for dysphoric mood. The patient is not nervous/anxious.       Objective:  BP 128/68   Pulse 78   Temp 98 F (36.7 C)   Ht 5' 5 (1.651 m)   Wt 200 lb (90.7 kg)   SpO2 98%   BMI 33.28 kg/m      11/17/2023    7:41 AM 09/26/2023   11:41 AM 06/28/2023    9:34 AM  BP/Weight  Systolic BP 128 138 108  Diastolic BP 68 92 64  Wt. (Lbs) 200  205.2  BMI 33.28 kg/m2  34.15 kg/m2    Physical Exam Vitals reviewed.  Constitutional:      Appearance: Normal appearance. She is obese.  HENT:     Right Ear: Tympanic membrane normal.     Left Ear: Tympanic membrane normal.     Nose: Nose normal.     Mouth/Throat:     Pharynx: No oropharyngeal exudate or posterior oropharyngeal erythema.  Eyes:     Conjunctiva/sclera: Conjunctivae normal.  Neck:     Vascular: No carotid bruit.  Cardiovascular:     Rate and Rhythm: Normal rate and regular rhythm.     Pulses: Normal pulses.     Heart sounds: Normal heart sounds.  Pulmonary:     Effort: Pulmonary effort is normal.     Breath sounds: Normal breath sounds.  Abdominal:     General: Bowel sounds are normal.     Palpations: There is no mass.     Tenderness: There is no abdominal tenderness.  Lymphadenopathy:     Cervical: No cervical adenopathy.  Neurological:     Mental Status: She is alert and oriented to person, place, and time.  Psychiatric:        Mood and Affect: Mood normal.        Behavior: Behavior normal.     Lab Results  Component Value Date   WBC 8.5 11/17/2023   HGB 13.3 11/17/2023   HCT 43.5 11/17/2023   PLT 304 11/17/2023   GLUCOSE 79 11/17/2023   CHOL 164 11/17/2023   TRIG 125 11/17/2023   HDL 44 11/17/2023   LDLCALC 98 11/17/2023   ALT 23 11/17/2023   AST 23 11/17/2023   NA 143 11/17/2023   K 4.4 11/17/2023   CL 102 11/17/2023   CREATININE 1.01 (H) 11/17/2023   BUN 12 11/17/2023   CO2 22 11/17/2023   TSH 2.170 11/17/2023   HGBA1C 5.4 05/25/2020      Assessment & Plan:   Routine medical exam -     CBC with Differential/Platelet -     VITAMIN D  25  Hydroxy (Vit-D Deficiency, Fractures) -     TSH -     Lipid panel -     Comprehensive metabolic panel with GFR  Need for hepatitis C screening test -     HCV Ab w Reflex to Quant PCR  Encounter for screening for HIV -     HIV Antibody (routine testing w rflx)  Other orders -     Interpretation:    Assessment and Plan          Body mass index is 33.28 kg/m.   These are the goals we discussed:  Goals   None      This is a list of the screening recommended for you and due dates:  Health Maintenance  Topic Date Due   Hepatitis B Vaccine (1 of 3 - 19+ 3-dose series) Never done   Zoster (Shingles) Vaccine (1 of 2) Never done   COVID-19 Vaccine (3 - 2024-25 season) 12/25/2022   Flu Shot  11/24/2023   Mammogram  01/04/2024   Pap with HPV screening  12/07/2025   DTaP/Tdap/Td vaccine (2 - Tdap) 08/19/2028   Colon Cancer Screening  01/16/2030   Hepatitis C Screening  Completed   HIV Screening  Completed   HPV Vaccine  Aged Out   Meningitis B Vaccine  Aged  Out     Meds ordered this encounter  Medications   DISCONTD: estrogen, conjugated,-medroxyprogesterone (PREMPRO ) 0.3-1.5 MG tablet    Sig: Take 1 tablet by mouth daily.    Dispense:  84 tablet    Refill:  0    Follow-up: Return in about 1 year (around 11/16/2024) for cpe.  An After Visit Summary was printed and given to the patient.  Abigail Free, MD [facility] [phone]

## 2023-11-17 ENCOUNTER — Ambulatory Visit (INDEPENDENT_AMBULATORY_CARE_PROVIDER_SITE_OTHER): Payer: 59 | Admitting: Family Medicine

## 2023-11-17 ENCOUNTER — Encounter: Payer: Self-pay | Admitting: Family Medicine

## 2023-11-17 ENCOUNTER — Other Ambulatory Visit (HOSPITAL_BASED_OUTPATIENT_CLINIC_OR_DEPARTMENT_OTHER): Payer: Self-pay

## 2023-11-17 ENCOUNTER — Encounter (HOSPITAL_BASED_OUTPATIENT_CLINIC_OR_DEPARTMENT_OTHER): Payer: Self-pay | Admitting: Pharmacy Technician

## 2023-11-17 ENCOUNTER — Other Ambulatory Visit: Payer: Self-pay | Admitting: Family Medicine

## 2023-11-17 VITALS — BP 128/68 | HR 78 | Temp 98.0°F | Ht 65.0 in | Wt 200.0 lb

## 2023-11-17 DIAGNOSIS — E6609 Other obesity due to excess calories: Secondary | ICD-10-CM

## 2023-11-17 DIAGNOSIS — Z0001 Encounter for general adult medical examination with abnormal findings: Secondary | ICD-10-CM

## 2023-11-17 DIAGNOSIS — Z114 Encounter for screening for human immunodeficiency virus [HIV]: Secondary | ICD-10-CM | POA: Diagnosis not present

## 2023-11-17 DIAGNOSIS — Z1159 Encounter for screening for other viral diseases: Secondary | ICD-10-CM | POA: Diagnosis not present

## 2023-11-17 DIAGNOSIS — N951 Menopausal and female climacteric states: Secondary | ICD-10-CM | POA: Diagnosis not present

## 2023-11-17 DIAGNOSIS — E782 Mixed hyperlipidemia: Secondary | ICD-10-CM | POA: Diagnosis not present

## 2023-11-17 DIAGNOSIS — Z0000 Encounter for general adult medical examination without abnormal findings: Secondary | ICD-10-CM | POA: Diagnosis not present

## 2023-11-17 DIAGNOSIS — I10 Essential (primary) hypertension: Secondary | ICD-10-CM | POA: Diagnosis not present

## 2023-11-17 DIAGNOSIS — Z6833 Body mass index (BMI) 33.0-33.9, adult: Secondary | ICD-10-CM

## 2023-11-17 DIAGNOSIS — E66811 Obesity, class 1: Secondary | ICD-10-CM | POA: Diagnosis not present

## 2023-11-17 MED ORDER — ESTRADIOL-NORETHINDRONE ACET 1-0.5 MG PO TABS
1.0000 | ORAL_TABLET | Freq: Every day | ORAL | 1 refills | Status: DC
  Filled 2023-11-17: qty 28, 28d supply, fill #0
  Filled 2023-12-22: qty 28, 28d supply, fill #1

## 2023-11-17 MED ORDER — PREMPRO 0.3-1.5 MG PO TABS
1.0000 | ORAL_TABLET | Freq: Every day | ORAL | 0 refills | Status: DC
  Filled 2023-11-17: qty 28, 28d supply, fill #0

## 2023-11-17 NOTE — Patient Instructions (Addendum)
 VISIT SUMMARY:  Today, you had your annual physical exam. We discussed your blood pressure, menopausal symptoms, cholesterol levels, foot and ankle pain, and allergic symptoms. We also reviewed your general health maintenance and upcoming vaccinations and screenings.  YOUR PLAN:  HYPERTENSION: Your blood pressure is generally well-controlled with your current medication, but you have had some occasional high readings. -Monitor your blood pressure daily. Keep a log.  -If you notice consistently high readings, we may consider increasing your losartan  to 100 mg.  MENOPAUSAL SYMPTOMS: You are experiencing hot flashes and mood disturbances related to menopause. -Consider hormone therapy for your menopausal symptoms. -Sent prempro 0.3/1.5 mg once daily. If not preferred and cost too high, please call or have the pharmacy call to change.    HYPERLIPIDEMIA: Your cholesterol levels are stable with your current medication. -Continue taking atorvastatin  20 mg daily.  GENERAL HEALTH MAINTENANCE: You are due for some vaccinations and screenings. -Consider getting the shingles vaccine. -Consider getting hepatitis B vaccine series. -Your mammogram is scheduled for September. -Consider getting the COVID vaccination in the fall. -We will perform HIV and hepatitis C screening with your other blood work.  Things to do to keep yourself healthy  - Exercise at least 30-45 minutes a day, 3-4 days a week.  - Eat a low-fat diet with lots of fruits and vegetables, up to 7-9 servings per day.  - Seatbelts can save your life. Wear them always.  - Smoke detectors on every level of your home, check batteries every year.  - Eye Doctor - have an eye exam every 1-2 years  - Alcohol -  If you drink, do it moderately, less than 2 drinks per day.  - Health Care Power of Attorney. Choose someone to speak for you if you are not able.  - Depression is common in our stressful world.If you're feeling down or losing interest  in things you normally enjoy, please come in for a visit.  - Violence - If anyone is threatening or hurting you, please call immediately.

## 2023-11-18 LAB — COMPREHENSIVE METABOLIC PANEL WITH GFR
ALT: 23 IU/L (ref 0–32)
AST: 23 IU/L (ref 0–40)
Albumin: 4.5 g/dL (ref 3.8–4.9)
Alkaline Phosphatase: 103 IU/L (ref 44–121)
BUN/Creatinine Ratio: 12 (ref 9–23)
BUN: 12 mg/dL (ref 6–24)
Bilirubin Total: 0.4 mg/dL (ref 0.0–1.2)
CO2: 22 mmol/L (ref 20–29)
Calcium: 9.7 mg/dL (ref 8.7–10.2)
Chloride: 102 mmol/L (ref 96–106)
Creatinine, Ser: 1.01 mg/dL — ABNORMAL HIGH (ref 0.57–1.00)
Globulin, Total: 2.8 g/dL (ref 1.5–4.5)
Glucose: 79 mg/dL (ref 70–99)
Potassium: 4.4 mmol/L (ref 3.5–5.2)
Sodium: 143 mmol/L (ref 134–144)
Total Protein: 7.3 g/dL (ref 6.0–8.5)
eGFR: 65 mL/min/1.73 (ref >59)

## 2023-11-18 LAB — CBC WITH DIFFERENTIAL/PLATELET
Basophils Absolute: 0.1 x10E3/uL (ref 0.0–0.2)
Basos: 1 %
EOS (ABSOLUTE): 0.1 x10E3/uL (ref 0.0–0.4)
Eos: 2 %
Hematocrit: 43.5 % (ref 34.0–46.6)
Hemoglobin: 13.3 g/dL (ref 11.1–15.9)
Immature Grans (Abs): 0 x10E3/uL (ref 0.0–0.1)
Immature Granulocytes: 0 %
Lymphocytes Absolute: 1.8 x10E3/uL (ref 0.7–3.1)
Lymphs: 22 %
MCH: 26.8 pg (ref 26.6–33.0)
MCHC: 30.6 g/dL — ABNORMAL LOW (ref 31.5–35.7)
MCV: 88 fL (ref 79–97)
Monocytes Absolute: 0.5 x10E3/uL (ref 0.1–0.9)
Monocytes: 6 %
Neutrophils Absolute: 5.9 x10E3/uL (ref 1.4–7.0)
Neutrophils: 69 %
Platelets: 304 x10E3/uL (ref 150–450)
RBC: 4.97 x10E6/uL (ref 3.77–5.28)
RDW: 14.5 % (ref 11.7–15.4)
WBC: 8.5 x10E3/uL (ref 3.4–10.8)

## 2023-11-18 LAB — LIPID PANEL
Chol/HDL Ratio: 3.7 ratio (ref 0.0–4.4)
Cholesterol, Total: 164 mg/dL (ref 100–199)
HDL: 44 mg/dL (ref >39)
LDL Chol Calc (NIH): 98 mg/dL (ref 0–99)
Triglycerides: 125 mg/dL (ref 0–149)
VLDL Cholesterol Cal: 22 mg/dL (ref 5–40)

## 2023-11-18 LAB — HIV ANTIBODY (ROUTINE TESTING W REFLEX): HIV Screen 4th Generation wRfx: NONREACTIVE

## 2023-11-18 LAB — HCV AB W REFLEX TO QUANT PCR: HCV Ab: NONREACTIVE

## 2023-11-18 LAB — HCV INTERPRETATION

## 2023-11-18 LAB — VITAMIN D 25 HYDROXY (VIT D DEFICIENCY, FRACTURES): Vit D, 25-Hydroxy: 68 ng/mL (ref 30.0–100.0)

## 2023-11-18 LAB — TSH: TSH: 2.17 u[IU]/mL (ref 0.450–4.500)

## 2023-11-19 ENCOUNTER — Ambulatory Visit: Payer: Self-pay | Admitting: Family Medicine

## 2023-11-19 DIAGNOSIS — Z1159 Encounter for screening for other viral diseases: Secondary | ICD-10-CM | POA: Insufficient documentation

## 2023-11-19 DIAGNOSIS — N951 Menopausal and female climacteric states: Secondary | ICD-10-CM | POA: Insufficient documentation

## 2023-11-19 DIAGNOSIS — Z114 Encounter for screening for human immunodeficiency virus [HIV]: Secondary | ICD-10-CM | POA: Insufficient documentation

## 2023-11-19 NOTE — Assessment & Plan Note (Signed)
 Recommend continue to work on eating healthy diet and exercise. Comorbidity: hyperlipidemia.

## 2023-11-19 NOTE — Assessment & Plan Note (Signed)
 Well controlled.  No changes to medicines. Atorvastatin  20 mg before bed.  Continue to work on eating a healthy diet and exercise.  Labs drawn today.

## 2023-11-19 NOTE — Assessment & Plan Note (Signed)
 Blood pressure controlled on losartan  50 mg, but occasional high readings reported. - Monitor blood pressure daily. - Consider increasing losartan  to 100 mg if consistently high readings are observed.

## 2023-11-19 NOTE — Assessment & Plan Note (Signed)
 Hot flashes persist. Hormone therapy discussed with associated risks.  Start on activella .

## 2023-11-19 NOTE — Assessment & Plan Note (Signed)
Education given. 

## 2023-11-20 ENCOUNTER — Other Ambulatory Visit (HOSPITAL_BASED_OUTPATIENT_CLINIC_OR_DEPARTMENT_OTHER): Payer: Self-pay

## 2023-11-24 ENCOUNTER — Other Ambulatory Visit (HOSPITAL_BASED_OUTPATIENT_CLINIC_OR_DEPARTMENT_OTHER): Payer: Self-pay

## 2023-12-06 ENCOUNTER — Encounter: Payer: Self-pay | Admitting: Family Medicine

## 2023-12-09 ENCOUNTER — Other Ambulatory Visit
Admission: RE | Admit: 2023-12-09 | Discharge: 2023-12-09 | Disposition: A | Discharge status: Home / Self Care | Source: Ambulatory Visit | Attending: Primary Care | Admitting: Primary Care

## 2023-12-09 DIAGNOSIS — G4700 Insomnia, unspecified: Secondary | ICD-10-CM | POA: Insufficient documentation

## 2023-12-09 DIAGNOSIS — E782 Mixed hyperlipidemia: Secondary | ICD-10-CM | POA: Insufficient documentation

## 2023-12-09 DIAGNOSIS — E1121 Type 2 diabetes mellitus with diabetic nephropathy: Secondary | ICD-10-CM | POA: Insufficient documentation

## 2023-12-09 LAB — COMPREHENSIVE METABOLIC PANEL
ALT: 18 U/L (ref 0–35)
AST: 18 U/L (ref 0–35)
Albumin: 4.1 g/dL (ref 3.5–5.2)
Alk Phos: 136 U/L — ABNORMAL HIGH (ref 35–105)
Anion Gap: 11 (ref 7–16)
Bilirubin,Total: 0.5 mg/dL (ref 0.0–1.2)
CO2: 23 mmol/L (ref 20–28)
Calcium: 9.9 mg/dL (ref 8.6–10.2)
Chloride: 105 mmol/L (ref 96–108)
Creatinine: 0.78 mg/dL (ref 0.51–0.95)
Glucose: 92 mg/dL (ref 60–99)
Lab: 14 mg/dL (ref 6–20)
Potassium: 5.5 mmol/L — ABNORMAL HIGH (ref 3.3–5.1)
Sodium: 139 mmol/L (ref 133–145)
Total Protein: 7 g/dL (ref 6.3–7.7)
eGFR BY CREAT: 89

## 2023-12-09 LAB — CBC
Hematocrit: 40 % (ref 34–49)
Hemoglobin: 13.1 g/dL (ref 11.2–16.0)
MCV: 84 fL (ref 75–100)
Platelets: 303 THOU/uL (ref 150–450)
RBC: 4.8 MIL/uL (ref 4.0–5.5)
RDW: 12.6 % (ref 0.0–15.0)
WBC: 7.3 THOU/uL (ref 3.5–11.0)

## 2023-12-09 LAB — LIPID PANEL
Chol/HDL Ratio: 3.3
Cholesterol: 144 mg/dL
HDL: 43 mg/dL (ref 40–60)
LDL Calculated: 83 mg/dL
Non HDL Cholesterol: 101 mg/dL
Triglycerides: 96 mg/dL

## 2023-12-09 LAB — TSH: TSH: 1.56 u[IU]/mL (ref 0.27–4.20)

## 2023-12-09 LAB — T4, FREE: Free T4: 1.2 ng/dL (ref 0.9–1.7)

## 2023-12-10 LAB — HEMOGLOBIN A1C: Hemoglobin A1C: 5.8 % — ABNORMAL HIGH (ref <=5.6)

## 2023-12-11 ENCOUNTER — Other Ambulatory Visit: Payer: Self-pay | Admitting: Family Medicine

## 2023-12-11 ENCOUNTER — Other Ambulatory Visit (HOSPITAL_BASED_OUTPATIENT_CLINIC_OR_DEPARTMENT_OTHER): Payer: Self-pay

## 2023-12-11 ENCOUNTER — Other Ambulatory Visit: Payer: Self-pay | Admitting: Gastroenterology

## 2023-12-11 MED ORDER — ATORVASTATIN CALCIUM 20 MG PO TABS
20.0000 mg | ORAL_TABLET | Freq: Every day | ORAL | 0 refills | Status: DC
  Filled 2023-12-11: qty 90, 90d supply, fill #0

## 2023-12-11 MED ORDER — LOSARTAN POTASSIUM 50 MG PO TABS
50.0000 mg | ORAL_TABLET | Freq: Every day | ORAL | 0 refills | Status: DC
  Filled 2023-12-11: qty 90, 90d supply, fill #0

## 2023-12-15 ENCOUNTER — Other Ambulatory Visit: Payer: Self-pay | Admitting: Gastroenterology

## 2023-12-15 ENCOUNTER — Other Ambulatory Visit: Payer: Self-pay | Admitting: Primary Care

## 2023-12-15 DIAGNOSIS — Z1231 Encounter for screening mammogram for malignant neoplasm of breast: Secondary | ICD-10-CM

## 2023-12-20 ENCOUNTER — Other Ambulatory Visit: Payer: Self-pay

## 2023-12-22 ENCOUNTER — Other Ambulatory Visit (HOSPITAL_BASED_OUTPATIENT_CLINIC_OR_DEPARTMENT_OTHER): Payer: Self-pay

## 2023-12-27 ENCOUNTER — Ambulatory Visit: Admitting: Dermatology

## 2023-12-27 ENCOUNTER — Other Ambulatory Visit
Admission: RE | Admit: 2023-12-27 | Discharge: 2023-12-27 | Disposition: A | Discharge status: Home / Self Care | Source: Ambulatory Visit | Attending: Dermatology | Admitting: Dermatology

## 2023-12-27 ENCOUNTER — Other Ambulatory Visit: Payer: Self-pay

## 2023-12-27 ENCOUNTER — Ambulatory Visit: Admitting: Rheumatology

## 2023-12-27 ENCOUNTER — Telehealth: Payer: Self-pay

## 2023-12-27 VITALS — BP 138/71

## 2023-12-27 DIAGNOSIS — L4050 Arthropathic psoriasis, unspecified: Secondary | ICD-10-CM

## 2023-12-27 DIAGNOSIS — L409 Psoriasis, unspecified: Secondary | ICD-10-CM

## 2023-12-27 DIAGNOSIS — K5792 Diverticulitis of intestine, part unspecified, without perforation or abscess without bleeding: Secondary | ICD-10-CM

## 2023-12-27 MED ORDER — TACROLIMUS 0.1 % EX OINT *I*
TOPICAL_OINTMENT | Freq: Two times a day (BID) | CUTANEOUS | 6 refills | Status: AC | PRN
Start: 2023-12-27 — End: ?

## 2023-12-27 NOTE — Patient Instructions (Signed)
 LABS DUE TODAY    Blood work has been ordered for you today.    Please use the following link to find a [facility] lab facility near you: [email or web address]  Alternatively, you can also look up "[facility] labs near me" to get to the same link as above  You do not need to be fasting prior to the blood draw    _____________________________________________________

## 2023-12-27 NOTE — Progress Notes (Signed)
 Rheumatology Follow-Up Note PRIMARY CARE PHYSICIAN: Prentiss Rue, MDRHEUMATOLOGY SNAPSHOT: No specialty comments available.  Subjective HPI: Loretta [name] is a 58 y.o. female with PMH diverticulitis, seasonal allergic rhinitis, PsA who is here for follow up of PsA. She was a secondary nonresponder to adalimumab , and she was started on ustekinumab  on the last visit. We also advised stopping phentermine due to elevated blood pressure. She reports that she is feeling much better on the ustekinumab . She has no active skin lesions but slight joint pain in the hands that is not troubling her at this time. She denies dactylitis, enthesitis Physical Exam SKIN: clear MSK   REVIEW of SYSTEMS:   Constitutional: No unexplained fever, no diaphoresis, no decreased appetite, no weight loss and no fatigue.  Musculoskeletal: See HPI for details. Joint pain, joint stiffness and morning stiffness. No joint swelling. lasting about 1-2 hours.  Skin: Rash. No nail changes.  Eyes: Negative. No eye redness, no pain and no eye dryness.  ENT: Negative. No dry mouth and no mouth sores.  Respiratory: Negative. No shortness of breath.  Cardiac: Negative. No chest pain and no palpitations.  Gastrointestinal: No difficulty swallowing, no nausea, no vomiting, no diarrhea and no constipation.  Genitourinary: Negative.  Endocrine: Negative.  Allergic/Immunologic: Negative. Hematologic: Negative. Neurologic: Negative.  Psychiatric: Negative.    Objective  PHYSICAL EXAMINATION:Wt 116.6 kg (257 lb)   BMI 44.81 kg/m Body mass index is 44.81 kg/m.There were no vitals filed for this visit.Constitutional:  Well-developed and well-nourished.   Overweight or obese.  Eyes:  Pupils are equal, round, and reactive, EOM are normal and conjunctivae appear normal.   HENT: Normocephalic and atraumatic.    There are no oral lesions and the salivary  pool is normal.  Neck:  Normal range of motion and neck supple.  Lymph: No adenopathy.  Cardiovascular:  Normal rate and regular rhythm.  Pulmonary:  Effort is normal.  Abdominal:  The abdomen is soft.   There is no abdominal tenderness. Skin:  Skin is warm and dry.   She has erythema and a rash. She has normal nails, no malar rash and no livedo reticularis.   Plaques on neck, and legs bilaterally. Neuro:  She is alert and is oriented.  There is normal coordination.  Psych:  There is a normal mood and affect and normal attention/engagement. Musculoskeletal Exam: Upper and lower extremities  There is peripheral joint tenderness. There is no peripheral joint swelling, no peripheral joint deformity and no abnormal or restricted ROM. TTP belly of bilateral achilles tendonsTTP R lateral epicondyle of elbow No TTP of plantar fascia insertions Axial exam  There is no spinal tenderness. MSK Comments  Tenderness in PIPs and dorsal foot PRIOR STUDIES:Labs: - Recent labs reviewed -- please see ERecord results review section for values.-  DMARD monitoring labs    Lab results: 06/01/240859 02/08/240939 09/13/231352 WBC 6.5 7.2  --  Hemoglobin 13.8 13.6  --  Hematocrit 45 43  --  Platelets 279 281  --  MCV 87 86  --  Seg Neut % 50.7 53.1  --  Lymphocyte %  --  36.0  --  Monocyte %  --  7.5  --  Eosinophil %  --  3.1  --  Basophil %  --  0.3  --  Creatinine 0.67 0.72  --  Total Protein 7.7 7.5  --  Albumin 4.4 4.4  --  Bilirubin,Total 0.4 0.4  --  Bilirubin,Direct  --  <0.2  --  AST 37* 18  --  ALT 61* 20  --  Alk Phos 112* 126*  --  Sedimentation Rate  --   --  32* CRP  --   --  6 25-OH Vit Total 40 27*  --   - Inflammatory markers etc -   Lab results: 09/13/231352 Sedimentation Rate 32* CRP 6 - Rheum serologies    Lab results: 09/13/231352 ANA Screen NEG Rheumatoid Factor <8  Cyclic Citrullin Peptide Ab <9.4  Radiology:   -Recent imaging reports reviewed -- please see ERecord imaging section for details.01/12/2022 11:49 AMBILATERAL STANDING KNEE X-RAYCLINICAL INFORMATION:  joint pain, L40.9-Psoriasis, unspecified.COMPARISON: 05/08/2019.PROCEDURE: AP projection of bilateral standing knee were obtained.FINDINGS/ Impression Moderate narrowing of the right medial compartment with mild subchondral sclerosis. The joint spaces on the left appear age-appropriate.END OF IMPRESSION 01/12/2022 11:49 AMBILATERAL HIP X-RAYSCLINICAL INFORMATION:  joint pain, L40.9-Psoriasis, unspecified.COMPARISON: 7/11/2021PROCEDURE: AP projection of the pelvis and 2 projections of the right hip and 2 projections of the left hip were obtained.FINDINGS/ Impression The hip joints appear age appropriate and unchanged from prior.END OF IMPRESSION  01/12/2022 11:48 AM BILATERAL HAND X-RAY CLINICAL INFORMATION:  joint pain, L40.9-Psoriasis, unspecified. COMPARISON:  None. PROCEDURE:  A single projection of the bilateral hands was obtained. IMPRESSION/FINDINGS:  The bones disclose adequate mineralization and trabecular pattern. No joint space loss, erosion, or periostitis noted. END OF IMPRESSION   Assessment  IMPRESSION:PsA: some residual MCP tenderness but otherwise doing well. She does not want to change medications despite some hand discomfort. At this point, we will continue this medication.   ICD-10-CM ICD-9-CM  1. Psoriatic arthritis  L40.50 696.0   2. Psoriasis  L40.9 696.1   PLANContinue tremfya Call for problems Next visit scheduled    Arrianna Catala, MD MPH

## 2023-12-27 NOTE — Progress Notes (Cosign Needed Addendum)
 Dermatology Progress NoteNo chief complaint on file.Derm and Relevant Medical History: -Previously followed with Dr. Jeralyn, chronic for >20 years-Previous treatments: ILK, clobetasol  (some improvement), taclonex (not covered by insurance), Humira  (03/2022-09/2022, ineffective)-Stelara  (started 09/2022)-History of diverticulitis, but no personal or family hx of IBD?HPI: Loretta [name] is a pleasant 58 y.o. female here for the concern noted below.?Last Visit: 09/27/22 in rheum-derm clinic with plans to start Stelara .Concern: Today, 12/27/23, here for follow-up in rheum-derm clinic-Continues with Stelara  every 3 months since 2024-Skin has been clear; previously skin flared with stress, mom passed away earlier this year and patient did not flare-Fingers and toes have some pain, but are not very stiff, would rate it as a 2/10 (was previously a 7/10)-Has lost 95 pounds since being on Ozempic, is also on blood pressure and cholesterol medications-Would not like to change anything with her plan and is happy with the resultsPersonal hx of skin cancer: NoFamily hx of skin cancer: Yes, mother with possible BCC.Family hx of other dermatologic conditions: Son with psoriasis.Social hx: Works as a Surveyor, mining. Mom passed away early in 30-Jan-2024. Physical Exam: There were no vitals filed for this visit.Skin: All of the following were examined, and were within normal limits, except as noted: Face, Ears, Extremities (RUE/LUE), and Extremities (RLE/LLE)-Thin scaly plaques on the bilateral cymba of the conchal bowlsLabs: -Hepatitis panel negative 03/15/22-TB negative 9/13/23Assessment/Plan:Psoriasis, chronic, <5% BSA involvement today, improved with StelaraPsoriatic arthritis, chronic, improved with Stelara -Previously failed treatments as noted aboveSYSTEMIC:-Continue ustekinumab  (Stelara , IL-12/23 inhibitor) pending reassuring lab work-Baseline  screening negative for hx of malignancy, hepatitis B / C, HIV, and TB-Yearly labs required: TB test (due today, order placed)-Potential side effects discussed: Injection site reactions, allergic reactions / cutaneous eruptions, increased risk of minor and serious infections (not evident in longitudinal study), increased risk of malignancy / lymphoma (controversial), neutralizing anti-drug antibodies; Reversible posterior leukoencephalopathy syndrome, non-infectious pneumonia, major adverse cardiovascular events (controversial)TOPICALS:-Continue tacrolimus  0.1% ointment twice daily as needed to the itchy, red, and scaly affected areas on the face and ears; refill sent today -The medication may cause burning with initial application, but this will stop with continued use-Continue calcipotriene  0.005% and betamethasone  dipropionate augmented 0.05% ointments twice daily as needed to the itchy, red, and scaly affected areas on the body-Do not use on face, underarms, or groin-Side effects: lightening or thinning of the skin, especially if used on areas where you don't have rash RTC: 59yr with Dr. Esther was discussed/examined with Dr. Hermann.Jaimi Belle, MDDermatology Resident, PGY-309/03/25 10:25 AM

## 2023-12-28 ENCOUNTER — Other Ambulatory Visit: Payer: Self-pay

## 2023-12-28 ENCOUNTER — Ambulatory Visit: Payer: Self-pay

## 2023-12-28 LAB — TB AG T-CELL STIMULATION
TB AG 1: 0.01 [IU]/mL
TB AG 2: 0.01 [IU]/mL
TB AG T-Cell Stim: NEGATIVE [IU]/mL

## 2023-12-28 MED ORDER — USTEKINUMAB (STELARA) 90 MG/ML SC SOSY *I*
90.0000 mg | PREFILLED_SYRINGE | SUBCUTANEOUS | 2 refills | Status: AC
Start: 2023-12-28 — End: ?
  Filled 2023-12-28: qty 1, 84d supply, fill #0
  Filled 2024-03-19: qty 1, 84d supply, fill #1

## 2023-12-28 NOTE — Telephone Encounter (Signed)
 Loretta [name]'s insurance changed from Loretta [name] to Loretta [name] for pharmacy coverage with a new prior authorization needed for ongoing refills. Medication name: Stelara  90 mg/mL prefilled syringeMedication dose/details: 90 mg SC every 12 weeksQty: 1 mLInsurance: ExcellusID:  Loretta [name] Gp:  [id number] [name] / Loretta [name] Thank rosine Mallie Hoof, PharmDUniversity of Russellton Specialty PharmacyPhone: [phone]: [phone]

## 2023-12-28 NOTE — Result Encounter Note (Signed)
 Results have been received for workup recently undertaken for:   Patient:  Loretta [name]  DOB:  01-09-1967TB results are negative. Our office will communicate these results to the patient via MyChart.Eathen Budreau, MDSeptember 4, 2025 3:47 PM

## 2023-12-29 ENCOUNTER — Other Ambulatory Visit: Payer: Self-pay

## 2024-01-01 ENCOUNTER — Other Ambulatory Visit: Payer: Self-pay

## 2024-01-02 ENCOUNTER — Other Ambulatory Visit: Payer: Self-pay

## 2024-01-03 ENCOUNTER — Other Ambulatory Visit: Payer: Self-pay

## 2024-01-03 DIAGNOSIS — L4050 Arthropathic psoriasis, unspecified: Secondary | ICD-10-CM

## 2024-01-04 ENCOUNTER — Other Ambulatory Visit: Payer: Self-pay | Admitting: Pharmacist

## 2024-01-04 ENCOUNTER — Other Ambulatory Visit: Payer: Self-pay

## 2024-01-05 ENCOUNTER — Other Ambulatory Visit: Payer: Self-pay | Admitting: Family Medicine

## 2024-01-05 ENCOUNTER — Other Ambulatory Visit: Payer: Self-pay

## 2024-01-05 DIAGNOSIS — Z1231 Encounter for screening mammogram for malignant neoplasm of breast: Secondary | ICD-10-CM

## 2024-01-10 ENCOUNTER — Ambulatory Visit

## 2024-01-10 ENCOUNTER — Ambulatory Visit
Admission: RE | Admit: 2024-01-10 | Discharge: 2024-01-10 | Disposition: A | Discharge status: Home / Self Care | Payer: 59 | Source: Ambulatory Visit | Attending: Family Medicine | Admitting: Family Medicine

## 2024-01-10 ENCOUNTER — Other Ambulatory Visit: Payer: Self-pay

## 2024-01-10 DIAGNOSIS — Z1231 Encounter for screening mammogram for malignant neoplasm of breast: Secondary | ICD-10-CM

## 2024-01-10 NOTE — Progress Notes (Signed)
 Patient is in office today for a nurse visit for Blood Pressure Check. Patient blood pressure was 130/80, Patient No chest pain, No shortness of breath, No dyspnea on exertion, No orthopnea, No paroxysmal nocturnal dyspnea, No edema, No palpitations, No syncope.

## 2024-01-11 ENCOUNTER — Other Ambulatory Visit
Admission: RE | Admit: 2024-01-11 | Discharge: 2024-01-11 | Disposition: A | Discharge status: Home / Self Care | Source: Ambulatory Visit | Attending: Primary Care | Admitting: Primary Care

## 2024-01-11 DIAGNOSIS — Z136 Encounter for screening for cardiovascular disorders: Secondary | ICD-10-CM | POA: Insufficient documentation

## 2024-01-11 DIAGNOSIS — Z0001 Encounter for general adult medical examination with abnormal findings: Secondary | ICD-10-CM | POA: Insufficient documentation

## 2024-01-11 LAB — CBC AND DIFFERENTIAL
Baso # K/uL: 0 THOU/uL (ref 0.0–0.2)
Eos # K/uL: 0.3 THOU/uL (ref 0.0–0.5)
Hematocrit: 41 % (ref 34–49)
Hemoglobin: 12.9 g/dL (ref 11.2–16.0)
IMM Granulocytes #: 0 THOU/uL
IMM Granulocytes: 0.1 %
Lymph # K/uL: 2.7 THOU/uL (ref 1.0–5.0)
MCV: 87 fL (ref 75–100)
Mono # K/uL: 0.4 THOU/uL (ref 0.1–1.0)
Neut # K/uL: 5.6 THOU/uL (ref 1.5–6.5)
Platelets: 313 THOU/uL (ref 150–450)
RBC: 4.8 MIL/uL (ref 4.0–5.5)
RDW: 12.7 % (ref 0.0–15.0)
Seg Neut %: 62.3 %
WBC: 9 THOU/uL (ref 3.5–11.0)

## 2024-01-11 LAB — COMPREHENSIVE METABOLIC PANEL
ALT: 21 U/L (ref 0–35)
AST: 17 U/L (ref 0–35)
Albumin: 4.3 g/dL (ref 3.5–5.2)
Alk Phos: 140 U/L — ABNORMAL HIGH (ref 35–105)
Anion Gap: 11 (ref 7–16)
Bilirubin,Total: 0.5 mg/dL (ref 0.0–1.2)
CO2: 22 mmol/L (ref 20–28)
Calcium: 9.8 mg/dL (ref 8.6–10.2)
Chloride: 104 mmol/L (ref 96–108)
Creatinine: 0.72 mg/dL (ref 0.51–0.95)
Glucose: 100 mg/dL — ABNORMAL HIGH (ref 60–99)
Lab: 13 mg/dL (ref 6–20)
Potassium: 5.1 mmol/L (ref 3.3–5.1)
Sodium: 137 mmol/L (ref 133–145)
Total Protein: 7.3 g/dL (ref 6.3–7.7)
eGFR BY CREAT: 97

## 2024-01-11 LAB — LIPID PANEL
Chol/HDL Ratio: 3
Cholesterol: 127 mg/dL
HDL: 43 mg/dL (ref 40–60)
LDL Calculated: 68 mg/dL
Non HDL Cholesterol: 84 mg/dL
Triglycerides: 83 mg/dL

## 2024-01-12 ENCOUNTER — Ambulatory Visit: Payer: Self-pay | Admitting: Family Medicine

## 2024-01-12 ENCOUNTER — Other Ambulatory Visit: Payer: Self-pay | Admitting: Gastroenterology

## 2024-01-16 ENCOUNTER — Other Ambulatory Visit: Payer: Self-pay | Admitting: Family Medicine

## 2024-01-16 MED ORDER — ESTRADIOL-NORETHINDRONE ACET 1-0.5 MG PO TABS
1.0000 | ORAL_TABLET | Freq: Every day | ORAL | 1 refills | Status: DC
  Filled 2024-01-16: qty 28, 28d supply, fill #0
  Filled 2024-02-16: qty 28, 28d supply, fill #1

## 2024-01-17 ENCOUNTER — Encounter: Payer: Self-pay | Admitting: Family Medicine

## 2024-01-17 ENCOUNTER — Other Ambulatory Visit (HOSPITAL_BASED_OUTPATIENT_CLINIC_OR_DEPARTMENT_OTHER): Payer: Self-pay

## 2024-02-11 ENCOUNTER — Other Ambulatory Visit: Payer: Self-pay

## 2024-02-11 ENCOUNTER — Emergency Department
Admission: EM | Admit: 2024-02-11 | Discharge: 2024-02-11 | Disposition: A | Discharge status: Home / Self Care | Source: Ambulatory Visit | Attending: Student in an Organized Health Care Education/Training Program | Admitting: Student in an Organized Health Care Education/Training Program

## 2024-02-11 DIAGNOSIS — T63441A Toxic effect of venom of bees, accidental (unintentional), initial encounter: Secondary | ICD-10-CM | POA: Insufficient documentation

## 2024-02-11 DIAGNOSIS — Y9289 Other specified places as the place of occurrence of the external cause: Secondary | ICD-10-CM | POA: Insufficient documentation

## 2024-02-11 DIAGNOSIS — R609 Edema, unspecified: Secondary | ICD-10-CM | POA: Insufficient documentation

## 2024-02-11 DIAGNOSIS — X58XXXA Exposure to other specified factors, initial encounter: Secondary | ICD-10-CM | POA: Insufficient documentation

## 2024-02-11 MED ORDER — CEPHALEXIN 500 MG PO CAPS *I*: 500.0000 mg | ORAL_CAPSULE | Freq: Four times a day (QID) | ORAL | 0 refills | Status: AC

## 2024-02-11 NOTE — ED Notes (Signed)
Agree with triage note.    Nursing Care Plan: Will monitor and assess VS and pain scores every 2-4 hours and PRN.  Perform frequent rounding PRN.  Provide updates to patient and/or caregiver frequently.  Provide support to patient/caregiver as needed.  Teach patient and/or caregivers about patients needs/status working towards discharge.  Patient oriented to room and given call bell.

## 2024-02-11 NOTE — Discharge Instructions (Addendum)
 Sleep with your head elevatedApply ice/cool compresses as much as you can over the first 48 hoursContinue taking your anti-histamine as neededWe discussed that we could not distinguish between a soft tissue infection an an isolated reaction to a bee sting, but that your exam today was reassuring. While your exam today was reassuring from an emergency standpoint, situations can evolve in unforeseen ways. If swelling, warmth, redness spreading and getting worse, start the antibiotic and complete the full course.If fever, dramatic swelling, pain, compromise in your vision, or if you think something more serious could be going on at any time, return to an ER for re-evaluation right away.

## 2024-02-11 NOTE — ED Triage Notes (Signed)
 Stung just below left eyes yesterday afternoon. Reports taking benadryl and zyrtec with little relief of orbital swelling. Denies vision changes associated. Denies respiratory distress. Denies previous reaction.   Prehospital medications given: YesOther: zyrtec, benadryl taken at 815am Also took pepcid at same time.

## 2024-02-12 NOTE — ED Provider Notes (Signed)
 History Chief Complaint Patient presents with  Insect Bite 58 yo female with progressive edema, warmth and erythema to L cheek and orbit since being stung by a bee yesterday. Swelling was worst upon awakening this AM, is now redistributing but still pronounced.History provided by:  Patient and medical recordsMedical/Surgical/Family History Past Medical History[1] Patient Active Problem List Diagnosis Code  Seasonal allergic rhinitis J30.2  Diverticulitis K57.92  Psoriasis L40.9  Psoriatic arthritis L40.50  Past Surgical History[2] Social History[3]  Review of SystemsPhysical Exam Triage VitalsTriage Start: Start, (02/11/24 1039)  First Recorded BP: 130/85, Resp: 18, Temp: 36.2 C (97.2 F) Oxygen Therapy SpO2: 100 %, Oximetry Source: Rt Hand, O2 Device: None (Room air), Heart Rate: 94, (02/11/24 1043)  .First Pain Reported 0-10  Pain Scale: 0, (02/11/24 1043) Physical ExamVitals and nursing note reviewed. Constitutional:     General: She is not in acute distress.   Appearance: She is not ill-appearing. HENT:    Head: Normocephalic.    Right Ear: External ear normal.    Left Ear: External ear normal.    Nose: Nose normal.    Mouth/Throat:    Pharynx: Oropharynx is clear. Eyes:    Extraocular Movements: Extraocular movements intact.    Conjunctiva/sclera: Conjunctivae normal.    Pupils: Pupils are equal, round, and reactive to light.    Comments: Marked edema to lower eyelid/inferior orbit, skin overlying maxilla, warmth to touch. EOMs full and painless, no conjunctival injection, no issues closing eye. Cardiovascular:    Rate and Rhythm: Normal rate. Pulmonary:    Effort: Pulmonary effort is normal. Neurological:    Mental Status: She is alert. Medical Decision Making Assessment:  Afebrile clinically well-appearing 58 yo female with localized reaction to bee sting  yesterday, ongoing. Discussed with her that I cannot distinguish between allergic rxn and soft tissue infection. Empiric course of keflex sent to pharmacy that pt can fill if she experiences the swelling, redness and warmth to be getting worse instead of holding steady or improving. She is amenable to this. She will continue cool compresses and benadryl PRN.Plan:  Orders Placed This Encounter    cephalexin (KEFLEX) 500 mg capsuleED return precautions reviewed. Peola Russella Roam, MD  [1] Past Medical History:Diagnosis Date  Anxiety   Psoriasis 01/05/2022 [2] Past Surgical History:Procedure Laterality Date  APPENDECTOMY    CHOLECYSTECTOMY    PR ARTHRS KNE SURG W/MENISCECTOMY MED/LAT W/SHVG Right 04/03/2018  Procedure: ARTHROSCOPY, KNEE, WITH MENISCECTOMY;  Surgeon: Glover Charleston, MD;  Location: [facility] OR;  Service: Orthopedics  TUBAL LIGATION   [3] Social HistoryTobacco Use  Smoking status: Never  Smokeless tobacco: Never Substance Use Topics  Alcohol use: Yes   Comment: Socially   Drug use: Never  Roam Peola Russella, MD10/20/25 2206

## 2024-02-13 ENCOUNTER — Other Ambulatory Visit (HOSPITAL_BASED_OUTPATIENT_CLINIC_OR_DEPARTMENT_OTHER): Payer: Self-pay

## 2024-02-13 MED ORDER — FLUZONE 0.5 ML IM SUSY
0.5000 mL | PREFILLED_SYRINGE | Freq: Once | INTRAMUSCULAR | 0 refills | Status: AC
  Filled 2024-02-13: qty 0.5, 1d supply, fill #0

## 2024-02-16 ENCOUNTER — Other Ambulatory Visit (HOSPITAL_BASED_OUTPATIENT_CLINIC_OR_DEPARTMENT_OTHER): Payer: Self-pay

## 2024-02-21 ENCOUNTER — Ambulatory Visit: Payer: Self-pay

## 2024-02-21 NOTE — Progress Notes (Signed)
 Acute Office Visit  Subjective:    Patient ID: Paige Hardy, female    DOB: 09/09/1965, 58 y.o.   MRN: [id number]  No chief complaint on file.    .[email or web address]  Past Medical History:  Diagnosis Date   Essential hypertension    Mixed hyperlipidemia    Vitamin D  deficiency     Past Surgical History:  Procedure Laterality Date   ANKLE ARTHROSCOPY Right 04/15/2022   Procedure: RIGHT ANKLE ARTHROSCOPY;  Surgeon: Harden Jerona GAILS, MD;  Location: [facility] OR;  Service: Orthopedics;  Laterality: Right;   ANKLE FRACTURE SURGERY Right    CHOLECYSTECTOMY     HARDWARE REMOVAL Right 04/15/2022   Procedure: HARDWARE REMOVAL RIGHT ANKLE;  Surgeon: Harden Jerona GAILS, MD;  Location: [facility] OR;  Service: Orthopedics;  Laterality: Right;    Family History  Problem Relation Age of Onset   Hypertension Other    Prostate cancer Father    Hypertension Maternal Grandfather    Diabetes Maternal Grandfather     Social History   Socioeconomic History   Marital status: Married    Spouse name: Not on file   Number of children: Not on file   Years of education: Not on file   Highest education level: Associate degree: academic program  Occupational History   Not on file  Tobacco Use   Smoking status: Never   Smokeless tobacco: Never  Vaping Use   Vaping status: Never Used  Substance and Sexual Activity   Alcohol use: Yes    Comment: socially   Drug use: Never   Sexual activity: Not on file  Other Topics Concern   Not on file  Social History Narrative   Not on file   Social Drivers of Health   Financial Resource Strain: Low Risk  (11/13/2023)   Overall Financial Resource Strain (CARDIA)    Difficulty of Paying Living Expenses: Not hard at all  Food Insecurity: No Food Insecurity (11/13/2023)   Hunger Vital Sign    Worried About Running Out of Food in the Last Year: Never true    Ran Out of Food in the Last Year: Never true  Transportation Needs: No Transportation Needs (11/13/2023)    PRAPARE - Administrator, Civil Service (Medical): No    Lack of Transportation (Non-Medical): No  Physical Activity: Insufficiently Active (11/13/2023)   Exercise Vital Sign    Days of Exercise per Week: 3 days    Minutes of Exercise per Session: 20 min  Stress: No Stress Concern Present (11/13/2023)   [employer] of Occupational Health - Occupational Stress Questionnaire    Feeling of Stress: Only a little  Social Connections: Socially Integrated (11/13/2023)   Social Connection and Isolation Panel    Frequency of Communication with Friends and Family: More than three times a week    Frequency of Social Gatherings with Friends and Family: Twice a week    Attends Religious Services: More than 4 times per year    Active Member of [employer] or Organizations: Yes    Attends Engineer, Structural: More than 4 times per year    Marital Status: Married  Catering Manager Violence: Not At Risk (11/07/2022)   Humiliation, Afraid, Rape, and Kick questionnaire    Fear of Current or Ex-Partner: No    Emotionally Abused: No    Physically Abused: No    Sexually Abused: No    Outpatient Medications Prior to Visit  Medication Sig Dispense Refill   atorvastatin  (LIPITOR)  20 MG tablet Take 1 tablet (20 mg total) by mouth daily. Patient needs an appointment 90 tablet 0   cholecalciferol (VITAMIN D3) 25 MCG (1000 UNIT) tablet Take 1,000 Units by mouth daily.     diphenhydrAMINE (BENADRYL) 25 MG tablet Take 12.5 mg by mouth every 6 (six) hours as needed.     estradiol -norethindrone  (ACTIVELLA ) 1-0.5 MG tablet Take 1 tablet by mouth daily. 28 tablet 1   losartan  (COZAAR ) 50 MG tablet Take 1 tablet (50 mg total) by mouth daily. Patient needs a fasting follow up appointment 90 tablet 0   meloxicam  (MOBIC ) 7.5 MG tablet Take 1 tablet (7.5 mg total) by mouth 2 (two) times daily for 2 weeks and then as needed 60 tablet 0   Multiple Vitamins-Minerals (MULTIVITAMIN ADULT) CHEW Chew 2 each  by mouth daily.     No facility-administered medications prior to visit.    No Known Allergies     Objective:        11/17/2023    7:41 AM 09/26/2023   11:41 AM 06/28/2023    9:34 AM  Vitals with BMI  Height 5' 5  5' 5  Weight 200 lbs  205 lbs 3 oz  BMI 33.28  34.15  Systolic 128 138 891  Diastolic 68 92 64  Pulse 78 94 88    No data found.   Physical Exam  Health Maintenance Due  Topic Date Due   Hepatitis B Vaccines 19-59 Average Risk (1 of 3 - 19+ 3-dose series) Never done   Pneumococcal Vaccine: 50+ Years (1 of 1 - PCV) Never done   Zoster Vaccines- Shingrix (1 of 2) Never done   COVID-19 Vaccine (3 - 2025-26 season) 12/25/2023       Topic Date Due   Hepatitis B Vaccines 19-59 Average Risk (1 of 3 - 19+ 3-dose series) Never done     Lab Results  Component Value Date   TSH 2.170 11/17/2023   Lab Results  Component Value Date   WBC 8.5 11/17/2023   HGB 13.3 11/17/2023   HCT 43.5 11/17/2023   MCV 88 11/17/2023   PLT 304 11/17/2023   Lab Results  Component Value Date   NA 143 11/17/2023   K 4.4 11/17/2023   CO2 22 11/17/2023   GLUCOSE 79 11/17/2023   BUN 12 11/17/2023   CREATININE 1.01 (H) 11/17/2023   BILITOT 0.4 11/17/2023   ALKPHOS 103 11/17/2023   AST 23 11/17/2023   ALT 23 11/17/2023   PROT 7.3 11/17/2023   ALBUMIN 4.5 11/17/2023   CALCIUM  9.7 11/17/2023   ANIONGAP 9 11/08/2022   EGFR 65 11/17/2023   Lab Results  Component Value Date   CHOL 164 11/17/2023   Lab Results  Component Value Date   HDL 44 11/17/2023   Lab Results  Component Value Date   LDLCALC 98 11/17/2023   Lab Results  Component Value Date   TRIG 125 11/17/2023   Lab Results  Component Value Date   CHOLHDL 3.7 11/17/2023   Lab Results  Component Value Date   HGBA1C 5.4 05/25/2020    Results for orders placed or performed in visit on 11/17/23  CBC with Differential/Platelet   Collection Time: 11/17/23  8:36 AM  Result Value Ref Range   WBC 8.5 3.4 -  10.8 x10E3/uL   RBC 4.97 3.77 - 5.28 x10E6/uL   Hemoglobin 13.3 11.1 - 15.9 g/dL   Hematocrit 56.4 65.9 - 46.6 %   MCV 88 79 - 97  fL   MCH 26.8 26.6 - 33.0 pg   MCHC 30.6 (L) 31.5 - 35.7 g/dL   RDW 85.4 88.2 - 84.5 %   Platelets 304 150 - 450 x10E3/uL   Neutrophils 69 Not Estab. %   Lymphs 22 Not Estab. %   Monocytes 6 Not Estab. %   Eos 2 Not Estab. %   Basos 1 Not Estab. %   Neutrophils Absolute 5.9 1.4 - 7.0 x10E3/uL   Lymphocytes Absolute 1.8 0.7 - 3.1 x10E3/uL   Monocytes Absolute 0.5 0.1 - 0.9 x10E3/uL   EOS (ABSOLUTE) 0.1 0.0 - 0.4 x10E3/uL   Basophils Absolute 0.1 0.0 - 0.2 x10E3/uL   Immature Granulocytes 0 Not Estab. %   Immature Grans (Abs) 0.0 0.0 - 0.1 x10E3/uL  VITAMIN D  25 Hydroxy (Vit-D Deficiency, Fractures)   Collection Time: 11/17/23  8:36 AM  Result Value Ref Range   Vit D, 25-Hydroxy 68.0 30.0 - 100.0 ng/mL  TSH   Collection Time: 11/17/23  8:36 AM  Result Value Ref Range   TSH 2.170 0.450 - 4.500 uIU/mL  Lipid panel   Collection Time: 11/17/23  8:36 AM  Result Value Ref Range   Cholesterol, Total 164 100 - 199 mg/dL   Triglycerides 874 0 - 149 mg/dL   HDL 44 >60 mg/dL   VLDL Cholesterol Cal 22 5 - 40 mg/dL   LDL Chol Calc (NIH) 98 0 - 99 mg/dL   Chol/HDL Ratio 3.7 0.0 - 4.4 ratio  HIV Antibody (routine testing w rflx)   Collection Time: 11/17/23  8:36 AM  Result Value Ref Range   HIV Screen 4th Generation wRfx Non Reactive Non Reactive  HCV Ab w Reflex to Quant PCR   Collection Time: 11/17/23  8:36 AM  Result Value Ref Range   HCV Ab Non Reactive Non Reactive  Comprehensive metabolic panel with GFR   Collection Time: 11/17/23  8:36 AM  Result Value Ref Range   Glucose 79 70 - 99 mg/dL   BUN 12 6 - 24 mg/dL   Creatinine, Ser 8.98 (H) 0.57 - 1.00 mg/dL   eGFR 65 >40 fO/fpw/8.26   BUN/Creatinine Ratio 12 9 - 23   Sodium 143 134 - 144 mmol/L   Potassium 4.4 3.5 - 5.2 mmol/L   Chloride 102 96 - 106 mmol/L   CO2 22 20 - 29 mmol/L   Calcium  9.7  8.7 - 10.2 mg/dL   Total Protein 7.3 6.0 - 8.5 g/dL   Albumin 4.5 3.8 - 4.9 g/dL   Globulin, Total 2.8 1.5 - 4.5 g/dL   Bilirubin Total 0.4 0.0 - 1.2 mg/dL   Alkaline Phosphatase 103 44 - 121 IU/L   AST 23 0 - 40 IU/L   ALT 23 0 - 32 IU/L       Assessment & Plan:   Assessment & Plan     Assessment and Plan Assessment & Plan       No orders of the defined types were placed in this encounter.   No orders of the defined types were placed in this encounter.    Follow-up: No follow-ups on file.  An After Visit Summary was printed and given to the patient.  Abigail Free, MD [facility] [phone]

## 2024-02-21 NOTE — Telephone Encounter (Signed)
 FYI Only or Action Required?: FYI only for provider: appointment scheduled on 10/30.  Patient was last seen in primary care on 11/17/2023 by Sherre Clapper, MD.  Called Nurse Triage reporting Tingling.  Symptoms began several weeks ago.  Interventions attempted: OTC medications: Vit B complex and Rest, hydration, or home remedies.  Symptoms are: stable.  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 Starts out as a pins and needles feeling then progresses to a sharp shock like pain.   1. ONSET: When did the pain start?      3 weeks ago  2. LOCATION: Where is the pain located?      Started in right toes and foot, now is running up back of leg, into thigh and buttocks.  3. PAIN: How bad is the pain?    (Scale 1-10; or mild, moderate, severe)     Moderate to severe, feels like a shock when doing certain movements  4. WORK OR EXERCISE: Has there been any recent work or exercise that involved this part of the body?      Denies  5. CAUSE: What do you think is causing the leg pain?     Unsure  6. OTHER SYMPTOMS: Do you have any other symptoms? (e.g., chest pain, back pain, breathing difficulty, swelling, rash, fever, numbness, weakness)     Has some difficulty getting up in the AM, has to stand there for a moment before being able to walk.  Protocols used: Leg Pain-A-AH  Copied from CRM [id number]. Topic: Clinical - Red Word Triage >> Feb 21, 2024  9:41 AM Sophia H wrote: Red Word that prompted transfer to Nurse Triage: **Right side - Tingling that starts in toes that goes up leg, pain that goes into thigh & buttock area.

## 2024-02-22 ENCOUNTER — Ambulatory Visit: Admitting: Family Medicine

## 2024-02-22 ENCOUNTER — Other Ambulatory Visit (HOSPITAL_BASED_OUTPATIENT_CLINIC_OR_DEPARTMENT_OTHER): Payer: Self-pay

## 2024-02-22 VITALS — BP 134/76 | HR 81 | Temp 97.9°F | Ht 65.0 in | Wt 200.0 lb

## 2024-02-22 DIAGNOSIS — M5431 Sciatica, right side: Secondary | ICD-10-CM

## 2024-02-22 MED ORDER — PREDNISONE 50 MG PO TABS
50.0000 mg | ORAL_TABLET | Freq: Every day | ORAL | 0 refills | Status: DC
  Filled 2024-02-22: qty 5, 5d supply, fill #0

## 2024-02-22 NOTE — Patient Instructions (Signed)
  VISIT SUMMARY: Today, we discussed your ongoing symptoms of sciatica in your left leg, which have been causing you significant pain and sleep disturbances. We have developed a plan to help manage your pain and improve your symptoms.  YOUR PLAN: SCIATICA, LEFT LOWER EXTREMITY: You have chronic pain and tingling in your left leg, which is suggestive of sciatica. This has been disrupting your sleep and causing significant discomfort. -Take prednisone  50 mg once daily for 5 days. -After finishing prednisone , start meloxicam  7.5 mh 1-2 daily after completion of prednisone .  -Perform the sciatica rehabilitation exercises twice daily as provided. -Apply ice to the affected area to help manage inflammation. -We have ordered an x-ray of the affected area, but you can defer this if your symptoms improve. -Consider physical therapy if your symptoms persist.                      Contains text generated by Abridge.                                 Contains text generated by Abridge.

## 2024-02-23 ENCOUNTER — Encounter: Payer: Self-pay | Admitting: Family Medicine

## 2024-02-26 ENCOUNTER — Encounter: Payer: Self-pay | Admitting: Family Medicine

## 2024-02-26 ENCOUNTER — Other Ambulatory Visit (HOSPITAL_BASED_OUTPATIENT_CLINIC_OR_DEPARTMENT_OTHER): Payer: Self-pay

## 2024-02-26 ENCOUNTER — Other Ambulatory Visit: Payer: Self-pay | Admitting: Family Medicine

## 2024-02-26 MED ORDER — MELOXICAM 15 MG PO TABS
15.0000 mg | ORAL_TABLET | Freq: Every day | ORAL | 0 refills | Status: AC
  Filled 2024-02-26: qty 30, 30d supply, fill #0

## 2024-03-08 ENCOUNTER — Other Ambulatory Visit: Payer: Self-pay | Admitting: Family Medicine

## 2024-03-08 ENCOUNTER — Other Ambulatory Visit (HOSPITAL_BASED_OUTPATIENT_CLINIC_OR_DEPARTMENT_OTHER): Payer: Self-pay

## 2024-03-08 MED ORDER — LOSARTAN POTASSIUM 50 MG PO TABS
50.0000 mg | ORAL_TABLET | Freq: Every day | ORAL | 0 refills | Status: AC
  Filled 2024-03-08: qty 90, 90d supply, fill #0

## 2024-03-08 MED ORDER — ATORVASTATIN CALCIUM 20 MG PO TABS
20.0000 mg | ORAL_TABLET | Freq: Every day | ORAL | 0 refills | Status: AC
  Filled 2024-03-08: qty 90, 90d supply, fill #0

## 2024-03-13 ENCOUNTER — Other Ambulatory Visit (HOSPITAL_BASED_OUTPATIENT_CLINIC_OR_DEPARTMENT_OTHER): Payer: Self-pay

## 2024-03-13 ENCOUNTER — Other Ambulatory Visit: Payer: Self-pay | Admitting: Family Medicine

## 2024-03-13 ENCOUNTER — Ambulatory Visit (INDEPENDENT_AMBULATORY_CARE_PROVIDER_SITE_OTHER)
Admission: RE | Admit: 2024-03-13 | Discharge: 2024-03-13 | Disposition: A | Discharge status: Home / Self Care | Source: Ambulatory Visit | Attending: Family Medicine | Admitting: Family Medicine

## 2024-03-13 DIAGNOSIS — M5431 Sciatica, right side: Secondary | ICD-10-CM | POA: Diagnosis not present

## 2024-03-13 DIAGNOSIS — M51371 Other intervertebral disc degeneration, lumbosacral region with lower extremity pain only: Secondary | ICD-10-CM | POA: Diagnosis not present

## 2024-03-13 DIAGNOSIS — M47816 Spondylosis without myelopathy or radiculopathy, lumbar region: Secondary | ICD-10-CM | POA: Diagnosis not present

## 2024-03-13 MED ORDER — ESTRADIOL-NORETHINDRONE ACET 1-0.5 MG PO TABS
1.0000 | ORAL_TABLET | Freq: Every day | ORAL | 1 refills | Status: DC
  Filled 2024-03-13: qty 28, 28d supply, fill #0
  Filled 2024-04-11: qty 28, 28d supply, fill #1

## 2024-03-14 ENCOUNTER — Ambulatory Visit: Payer: Self-pay | Admitting: Family Medicine

## 2024-03-14 ENCOUNTER — Other Ambulatory Visit (HOSPITAL_BASED_OUTPATIENT_CLINIC_OR_DEPARTMENT_OTHER): Payer: Self-pay

## 2024-03-19 ENCOUNTER — Other Ambulatory Visit: Payer: Self-pay

## 2024-03-19 DIAGNOSIS — L4050 Arthropathic psoriasis, unspecified: Secondary | ICD-10-CM

## 2024-03-20 ENCOUNTER — Other Ambulatory Visit: Payer: Self-pay

## 2024-03-25 ENCOUNTER — Other Ambulatory Visit: Payer: Self-pay

## 2024-03-27 ENCOUNTER — Other Ambulatory Visit: Payer: Self-pay

## 2024-03-28 ENCOUNTER — Other Ambulatory Visit: Payer: Self-pay

## 2024-04-01 ENCOUNTER — Other Ambulatory Visit: Payer: Self-pay

## 2024-04-02 ENCOUNTER — Other Ambulatory Visit: Payer: Self-pay

## 2024-04-03 ENCOUNTER — Other Ambulatory Visit: Payer: Self-pay

## 2024-04-03 ENCOUNTER — Encounter: Payer: Self-pay | Admitting: Pharmacist

## 2024-04-04 ENCOUNTER — Other Ambulatory Visit: Payer: Self-pay | Admitting: Pharmacist

## 2024-04-04 ENCOUNTER — Other Ambulatory Visit: Payer: Self-pay

## 2024-04-05 ENCOUNTER — Other Ambulatory Visit: Payer: Self-pay

## 2024-04-08 ENCOUNTER — Encounter (HOSPITAL_BASED_OUTPATIENT_CLINIC_OR_DEPARTMENT_OTHER): Payer: Self-pay

## 2024-04-08 ENCOUNTER — Ambulatory Visit (HOSPITAL_BASED_OUTPATIENT_CLINIC_OR_DEPARTMENT_OTHER)
Admission: RE | Admit: 2024-04-08 | Discharge: 2024-04-08 | Disposition: A | Discharge status: Home / Self Care | Attending: Family Medicine

## 2024-04-08 ENCOUNTER — Other Ambulatory Visit (HOSPITAL_BASED_OUTPATIENT_CLINIC_OR_DEPARTMENT_OTHER): Payer: Self-pay

## 2024-04-08 ENCOUNTER — Other Ambulatory Visit: Payer: Self-pay

## 2024-04-08 VITALS — BP 186/111 | HR 78 | Temp 98.3°F | Resp 20

## 2024-04-08 DIAGNOSIS — H00015 Hordeolum externum left lower eyelid: Secondary | ICD-10-CM

## 2024-04-08 MED ORDER — ERYTHROMYCIN 5 MG/GM OP OINT
TOPICAL_OINTMENT | OPHTHALMIC | 0 refills | Status: DC
  Filled 2024-04-08: qty 3.5, 7d supply, fill #0

## 2024-04-08 NOTE — ED Triage Notes (Signed)
 Patient states noticed left eye swelling on Saturday evening. Woke up Sunday with eye swollen shut. Denies itching. States when closing eye hard, feels bruised.No blurred vision.

## 2024-04-08 NOTE — Discharge Instructions (Signed)
 This is most likely a stye that is causing issues.  Recommend warm compresses to the area 3-4 times a day.  Keep the area clean and apply the antibiotic ointment as prescribed. If the area becomes more swollen or painful you will need to follow-up.

## 2024-04-08 NOTE — ED Provider Notes (Signed)
 [facility]    CSN: [id number] Arrival date & time: 04/08/24  1635      History   Chief Complaint Chief Complaint  Patient presents with   Eye Problem    HPI Paige Hardy is a 58 y.o. female.   Pt is a 58 year old female that presents with left eye swelling. Patient states noticed left eye swelling on Saturday evening. Woke up Sunday with eye swollen shut. Denies itching. States when closing eye hard, feels bruised.No blurred vision.    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   Colon cancer screening 11/20/2019   Class 1 obesity due to excess calories with serious comorbidity and body mass index (BMI) of 33.0 to 33.9 in adult 08/20/2019   Essential hypertension    Mixed hyperlipidemia    Vitamin D  deficiency    PMB (postmenopausal bleeding) 11/07/2018   Irregular menstrual bleeding 05/15/2018    Past Surgical History:  Procedure Laterality Date   ANKLE ARTHROSCOPY Right 04/15/2022   Procedure: RIGHT ANKLE ARTHROSCOPY;  Surgeon: Harden Jerona GAILS, MD;  Location: [facility] OR;  Service: Orthopedics;  Laterality: Right;   ANKLE FRACTURE SURGERY Right    CHOLECYSTECTOMY     HARDWARE REMOVAL Right 04/15/2022   Procedure: HARDWARE REMOVAL RIGHT ANKLE;  Surgeon: Harden Jerona GAILS, MD;  Location: [facility] OR;  Service: Orthopedics;  Laterality: Right;    OB History   No obstetric history on file.      Home  Medications    Prior to Admission medications  Medication Sig Start Date End Date Taking? Authorizing Provider  erythromycin  ophthalmic ointment Place a 1/2 inch ribbon of ointment into the lower eyelid 3-4 times a day 04/08/24  Yes Etha Stambaugh A, FNP  meloxicam  (MOBIC ) 15 MG tablet Take 1 tablet (15 mg total) by mouth daily. 02/26/24   Cox, Abigail, MD  atorvastatin  (LIPITOR) 20 MG tablet Take 1 tablet (20 mg total) by mouth daily. Patient needs an appointment 03/08/24   Cox, Abigail, MD  b complex vitamins capsule Take 1 capsule by mouth daily.    [provider]  cholecalciferol (VITAMIN D3) 25 MCG (1000 UNIT) tablet Take 1,000 Units by mouth daily.    [provider]  diphenhydrAMINE (BENADRYL) 25 MG tablet Take 12.5 mg by mouth every 6 (six) hours as needed.    [provider]  estradiol -norethindrone  (ACTIVELLA ) 1-0.5 MG tablet Take 1 tablet by mouth daily. 03/13/24   CoxAbigail, MD  losartan  (COZAAR ) 50 MG tablet Take 1 tablet (50 mg total) by mouth daily. Patient needs a fasting follow up appointment 03/08/24   CoxAbigail, MD  Multiple Vitamins-Minerals (MULTIVITAMIN ADULT) CHEW Chew 2 each by mouth daily.    [provider]  predniSONE  (DELTASONE ) 50 MG tablet Take 1 tablet (50 mg total) by mouth daily with breakfast. 02/22/24   Sherre Abigail, MD  Family History Family History  Problem Relation Age of Onset   Hypertension Other    Prostate cancer Father    Hypertension Maternal Grandfather    Diabetes Maternal Grandfather     Social History Social History[1]   Allergies   Patient has no known allergies.   Review of Systems Review of Systems See HPI  Physical Exam Triage Vital Signs ED Triage Vitals  Encounter Vitals Group     BP 04/08/24 1721 (!) 186/111     Girls Systolic BP Percentile --      Girls Diastolic BP Percentile --      Boys Systolic BP Percentile --      Boys Diastolic BP Percentile --      Pulse Rate 04/08/24  1721 78     Resp 04/08/24 1721 20     Temp 04/08/24 1721 98.3 F (36.8 C)     Temp Source 04/08/24 1721 Oral     SpO2 04/08/24 1721 98 %     Weight --      Height --      Head Circumference --      Peak Flow --      Pain Score 04/08/24 1723 0     Pain Loc --      Pain Education --      Exclude from Growth Chart --    No data found.  Updated Vital Signs BP (!) 186/111 (BP Location: Right Arm)   Pulse 78   Temp 98.3 F (36.8 C) (Oral)   Resp 20   SpO2 98%   Visual Acuity Right Eye Distance:   Left Eye Distance:   Bilateral Distance:    Right Eye Near:   Left Eye Near:    Bilateral Near:     Physical Exam Vitals and nursing note reviewed.  Constitutional:      General: She is not in acute distress.    Appearance: Normal appearance. She is not ill-appearing, toxic-appearing or diaphoretic.  Eyes:     General:        Right eye: No discharge.        Left eye: No discharge.     Conjunctiva/sclera: Conjunctivae normal.     Comments: Stye to left lower lid with erythema and swelling to lower lid and under eye. No pain with palpation.   Pulmonary:     Effort: Pulmonary effort is normal.  Neurological:     Mental Status: She is alert.  Psychiatric:        Mood and Affect: Mood normal.      UC Treatments / Results  Labs (all labs ordered are listed, but only abnormal results are displayed) Labs Reviewed - No data to display  EKG   Radiology No results found.  Procedures Procedures (including critical care time)  Medications Ordered in UC Medications - No data to display  Initial Impression / Assessment and Plan / UC Course  I have reviewed the triage vital signs and the nursing notes.  Pertinent labs & imaging results that were available during my care of the patient were reviewed by me and considered in my medical decision making (see chart for details).     Stye- This is most likely a stye that is causing issues.  Recommend warm compresses to the  area 3-4 times a day.  Keep the area clean and apply the antibiotic ointment as prescribed. If the area becomes more swollen or painful you will need to follow-up. Final Clinical Impressions(s) /  UC Diagnoses   Final diagnoses:  Hordeolum externum of left lower eyelid     Discharge Instructions      This is most likely a stye that is causing issues.  Recommend warm compresses to the area 3-4 times a day.  Keep the area clean and apply the antibiotic ointment as prescribed. If the area becomes more swollen or painful you will need to follow-up.    ED Prescriptions     Medication Sig Dispense Auth. Provider   erythromycin  ophthalmic ointment Place a 1/2 inch ribbon of ointment into the lower eyelid 3-4 times a day 3.5 g Adah Corning A, FNP      PDMP not reviewed this encounter.     [1]  Social History Tobacco Use   Smoking status: Never   Smokeless tobacco: Never  Vaping Use   Vaping status: Never Used  Substance Use Topics   Alcohol use: Yes    Comment: socially   Drug use: Never     Adah Corning LABOR, FNP 04/09/24 [phone]

## 2024-04-09 ENCOUNTER — Other Ambulatory Visit: Payer: Self-pay

## 2024-04-11 ENCOUNTER — Other Ambulatory Visit: Payer: Self-pay | Admitting: Family Medicine

## 2024-04-11 ENCOUNTER — Other Ambulatory Visit (HOSPITAL_BASED_OUTPATIENT_CLINIC_OR_DEPARTMENT_OTHER): Payer: Self-pay

## 2024-04-11 MED ORDER — ESTRADIOL-NORETHINDRONE ACET 1-0.5 MG PO TABS
1.0000 | ORAL_TABLET | Freq: Every day | ORAL | 3 refills | Status: AC
  Filled 2024-04-11: qty 90, 90d supply, fill #0
  Filled 2024-04-12: qty 28, 28d supply, fill #0
  Filled 2024-05-08: qty 28, 28d supply, fill #1

## 2024-04-12 ENCOUNTER — Other Ambulatory Visit (HOSPITAL_BASED_OUTPATIENT_CLINIC_OR_DEPARTMENT_OTHER): Payer: Self-pay

## 2024-04-16 ENCOUNTER — Encounter: Payer: Self-pay | Admitting: Family Medicine

## 2024-04-21 ENCOUNTER — Other Ambulatory Visit: Payer: Self-pay | Admitting: Family Medicine

## 2024-04-21 DIAGNOSIS — M5431 Sciatica, right side: Secondary | ICD-10-CM

## 2024-04-24 ENCOUNTER — Other Ambulatory Visit (HOSPITAL_BASED_OUTPATIENT_CLINIC_OR_DEPARTMENT_OTHER): Payer: Self-pay

## 2024-04-24 ENCOUNTER — Encounter (HOSPITAL_BASED_OUTPATIENT_CLINIC_OR_DEPARTMENT_OTHER): Payer: Self-pay

## 2024-04-24 ENCOUNTER — Ambulatory Visit (HOSPITAL_BASED_OUTPATIENT_CLINIC_OR_DEPARTMENT_OTHER)
Admission: EM | Admit: 2024-04-24 | Discharge: 2024-04-24 | Disposition: A | Discharge status: Home / Self Care | Source: Home / Self Care

## 2024-04-24 DIAGNOSIS — I10 Essential (primary) hypertension: Secondary | ICD-10-CM | POA: Diagnosis not present

## 2024-04-24 DIAGNOSIS — J208 Acute bronchitis due to other specified organisms: Secondary | ICD-10-CM | POA: Diagnosis not present

## 2024-04-24 MED ORDER — PROMETHAZINE-DM 6.25-15 MG/5ML PO SYRP
5.0000 mL | ORAL_SOLUTION | Freq: Four times a day (QID) | ORAL | 0 refills | Status: AC | PRN
  Filled 2024-04-24: qty 118, 6d supply, fill #0

## 2024-04-24 MED ORDER — PREDNISONE 20 MG PO TABS
20.0000 mg | ORAL_TABLET | Freq: Every day | ORAL | 0 refills | Status: AC
  Filled 2024-04-24: qty 5, 5d supply, fill #0

## 2024-04-24 MED ORDER — AIRSUPRA 90-80 MCG/ACT IN AERO
2.0000 | INHALATION_SPRAY | RESPIRATORY_TRACT | 0 refills | Status: AC | PRN
  Filled 2024-04-24: qty 10.7, 10d supply, fill #0

## 2024-04-24 NOTE — Discharge Instructions (Addendum)
 Viral bronchitis: Prednisone  20 mg daily for 5 days.  Airsupra inhaler, 2 puffs, every 4 hours if needed for wheezing.  If Paige Hardy is not covered on her insurance, switch to albuterol/Ventolin inhaler, 2 puffs every 4 hours if needed for wheezing.  Promethazine  DM, 5 mL, every 6 hours if needed for cough.  Get plenty of fluids and rest.  Elevated blood pressure with a known diagnosis of hypertension: Several blood pressure readings were elevated today.  Continue losartan  50 mg daily but may need to take an extra dose today.  Cut back on salty foods in the diet.  Get more fluids.  See primary care regarding elevated blood pressures.  Follow-up if symptoms do not improve, worsen or new symptoms occur.

## 2024-04-24 NOTE — ED Provider Notes (Signed)
 " [facility]    CSN: [id number] Arrival date & time: 04/24/24  1228      History   Chief Complaint Chief Complaint  Patient presents with   Cough    HPI Paige Hardy is a 58 y.o. female.   58 year old female with complaint of a moist irritant cough that started on 04/18/2024 or earlier.  She has had some runny nose but no facial pressure or pain.  She denies fever, nausea, vomiting, constipation, diarrhea.  She has been using DayQuil with some relief.  She has been eating Holiday foods and is not sure her diet has been perfect lately.  Her blood pressure is elevated today.   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   Need for hepatitis C screening test 11/19/2023   Encounter for screening for HIV 11/19/2023   Acute left ankle pain 06/28/2023   Left ankle swelling 06/28/2023   Routine medical exam 11/07/2022   Myalgia 11/07/2022   Mild vitamin D  deficiency 11/07/2022   Otitis media 11/07/2022   Pain from implanted hardware 04/15/2022   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   Colon cancer screening 11/20/2019   Class 1 obesity due to excess calories with serious comorbidity and body mass index (BMI) of 33.0 to 33.9 in adult 08/20/2019   Essential hypertension    Mixed hyperlipidemia    Vitamin D  deficiency    PMB (postmenopausal bleeding) 11/07/2018   Irregular menstrual bleeding 05/15/2018    Past Surgical History:  Procedure Laterality Date   ANKLE ARTHROSCOPY Right 04/15/2022   Procedure: RIGHT ANKLE ARTHROSCOPY;  Surgeon: Harden Jerona GAILS, MD;  Location: [facility] OR;  Service: Orthopedics;  Laterality: Right;    ANKLE FRACTURE SURGERY Right    CHOLECYSTECTOMY     HARDWARE REMOVAL Right 04/15/2022   Procedure: HARDWARE REMOVAL RIGHT ANKLE;  Surgeon: Harden Jerona GAILS, MD;  Location: [facility] OR;  Service: Orthopedics;  Laterality: Right;    OB History   No obstetric history on file.      Home Medications    Prior to Admission medications  Medication Sig Start Date End Date Taking? Authorizing Provider  Albuterol-Budesonide (AIRSUPRA) 90-80 MCG/ACT AERO Inhale 2 puffs into the lungs every 4 (four) hours as needed (wheezing.  Rinse mouth after use). 04/24/24  Yes Ival Domino, FNP  meloxicam  (MOBIC ) 15 MG tablet Take 1 tablet (15 mg total) by mouth daily. 02/26/24   CoxAbigail, MD  predniSONE  (DELTASONE ) 20 MG tablet Take 1 tablet (20 mg total) by mouth daily with breakfast for 5 days. 04/24/24 04/29/24 Yes Ival Domino, FNP  promethazine -dextromethorphan (PROMETHAZINE -DM) 6.25-15 MG/5ML syrup Take 5 mLs by mouth 4 (four) times daily as needed for cough. Do not use and drive - May make drowsy. 04/24/24  Yes Ival Domino, FNP  atorvastatin  (LIPITOR) 20 MG tablet Take 1 tablet (20 mg total) by mouth daily. Patient needs an appointment 03/08/24   Cox, Abigail, MD  b complex vitamins capsule Take 1 capsule by mouth daily.    [provider]  cholecalciferol (VITAMIN D3) 25 MCG (1000 UNIT) tablet Take 1,000 Units by mouth  daily.    [provider]  diphenhydrAMINE (BENADRYL) 25 MG tablet Take 12.5 mg by mouth every 6 (six) hours as needed.    [provider]  estradiol -norethindrone  (ABIGALE ) 1-0.5 MG tablet Take 1 tablet by mouth daily. 04/11/24   CoxAbigail, MD  losartan  (COZAAR ) 50 MG tablet Take 1 tablet (50 mg total) by mouth daily. Patient needs a fasting follow up appointment 03/08/24   CoxAbigail, MD  Multiple Vitamins-Minerals (MULTIVITAMIN ADULT) CHEW Chew 2 each by mouth daily.    [provider]    Family History Family History  Problem Relation Age of  Onset   Hypertension Other    Prostate cancer Father    Hypertension Maternal Grandfather    Diabetes Maternal Grandfather     Social History Social History[1]   Allergies   Patient has no known allergies.   Review of Systems Review of Systems  Constitutional:  Negative for chills and fever.  HENT:  Positive for congestion, postnasal drip and rhinorrhea. Negative for ear pain and sore throat.   Eyes:  Negative for pain and visual disturbance.  Respiratory:  Positive for cough. Negative for shortness of breath.   Cardiovascular:  Negative for chest pain and palpitations.  Gastrointestinal:  Negative for abdominal pain, constipation, diarrhea, nausea and vomiting.  Genitourinary:  Negative for dysuria and hematuria.  Musculoskeletal:  Negative for arthralgias and back pain.  Skin:  Negative for color change and rash.  Neurological:  Negative for seizures and syncope.  All other systems reviewed and are negative.    Physical Exam Triage Vital Signs ED Triage Vitals  Encounter Vitals Group     BP 04/24/24 1238 (!) 181/114     Girls Systolic BP Percentile --      Girls Diastolic BP Percentile --      Boys Systolic BP Percentile --      Boys Diastolic BP Percentile --      Pulse Rate 04/24/24 1238 76     Resp 04/24/24 1238 20     Temp 04/24/24 1238 98.6 F (37 C)     Temp Source 04/24/24 1238 Oral     SpO2 04/24/24 1238 97 %     Weight --      Height --      Head Circumference --      Peak Flow --      Pain Score 04/24/24 1240 6     Pain Loc --      Pain Education --      Exclude from Growth Chart --    No data found.  Updated Vital Signs BP (!) 182/100 (BP Location: Left Arm)   Pulse 76   Temp 98.6 F (37 C) (Oral)   Resp 20   SpO2 97%   Visual Acuity Right Eye Distance:   Left Eye Distance:   Bilateral Distance:    Right Eye Near:   Left Eye Near:    Bilateral Near:     Physical Exam Vitals and nursing note reviewed.  Constitutional:       General: She is not in acute distress.    Appearance: She is well-developed. She is not ill-appearing or toxic-appearing.  HENT:     Head: Normocephalic and atraumatic.     Right Ear: Hearing, tympanic membrane, ear canal and external ear normal.     Left Ear: Hearing, tympanic membrane, ear canal and external ear normal.     Nose: Congestion and rhinorrhea present. Rhinorrhea is clear.  Right Sinus: No maxillary sinus tenderness or frontal sinus tenderness.     Left Sinus: No maxillary sinus tenderness or frontal sinus tenderness.     Mouth/Throat:     Lips: Pink.     Mouth: Mucous membranes are moist.     Pharynx: Uvula midline. No oropharyngeal exudate or posterior oropharyngeal erythema.     Tonsils: No tonsillar exudate.  Eyes:     Conjunctiva/sclera: Conjunctivae normal.     Pupils: Pupils are equal, round, and reactive to light.  Cardiovascular:     Rate and Rhythm: Normal rate and regular rhythm.     Heart sounds: S1 normal and S2 normal. No murmur heard. Pulmonary:     Effort: Pulmonary effort is normal. No respiratory distress.     Breath sounds: Examination of the right-upper field reveals wheezing. Examination of the left-upper field reveals wheezing. Wheezing (Rare, intermittent inspiratory wheeze.) present. No decreased breath sounds, rhonchi or rales.  Abdominal:     General: Bowel sounds are normal.     Palpations: Abdomen is soft.     Tenderness: There is no abdominal tenderness.  Musculoskeletal:        General: No swelling.     Cervical back: Neck supple.  Lymphadenopathy:     Head:     Right side of head: No submental, submandibular, tonsillar, preauricular or posterior auricular adenopathy.     Left side of head: No submental, submandibular, tonsillar, preauricular or posterior auricular adenopathy.     Cervical: Cervical adenopathy present.     Right cervical: Superficial cervical adenopathy present.     Left cervical: Superficial cervical adenopathy  present.  Skin:    General: Skin is warm and dry.     Capillary Refill: Capillary refill takes less than 2 seconds.     Findings: No rash.  Neurological:     Mental Status: She is alert and oriented to person, place, and time.  Psychiatric:        Mood and Affect: Mood normal.      UC Treatments / Results  Labs (all labs ordered are listed, but only abnormal results are displayed) Labs Reviewed - No data to display  EKG   Radiology No results found.  Procedures Procedures (including critical care time)  Medications Ordered in UC Medications - No data to display  Initial Impression / Assessment and Plan / UC Course  I have reviewed the triage vital signs and the nursing notes.  Pertinent labs & imaging results that were available during my care of the patient were reviewed by me and considered in my medical decision making (see chart for details).  Plan of Care (see discharge instructions for additional patient precautions and education): Viral bronchitis: Prednisone  20 mg daily for 5 days.  Airsupra inhaler, 2 puffs, every 4 hours if needed for wheezing.  If Paige Hardy is not covered on her insurance, switch to albuterol/Ventolin inhaler, 2 puffs every 4 hours if needed for wheezing.  Promethazine  DM, 5 mL, every 6 hours if needed for cough.  Get plenty of fluids and rest.  Elevated blood pressure with a known diagnosis of hypertension: Several blood pressure readings were elevated today.  Continue losartan  50 mg daily but may need to take an extra dose today.  Cut back on salty foods in the diet.  Get more fluids.  See primary care regarding elevated blood pressures.  Follow-up if symptoms do not improve, worsen or new symptoms occur.  I reviewed the plan of care with the  patient and/or the patient's guardian.  The patient and/or guardian had time to ask questions and acknowledged that the questions were answered.  Final Clinical Impressions(s) / UC Diagnoses   Final  diagnoses:  Acute viral bronchitis  Elevated blood pressure reading in office with diagnosis of hypertension     Discharge Instructions      Viral bronchitis: Prednisone  20 mg daily for 5 days.  Airsupra inhaler, 2 puffs, every 4 hours if needed for wheezing.  If Paige Hardy is not covered on her insurance, switch to albuterol/Ventolin inhaler, 2 puffs every 4 hours if needed for wheezing.  Promethazine  DM, 5 mL, every 6 hours if needed for cough.  Get plenty of fluids and rest.  Elevated blood pressure with a known diagnosis of hypertension: Several blood pressure readings were elevated today.  Continue losartan  50 mg daily but may need to take an extra dose today.  Cut back on salty foods in the diet.  Get more fluids.  See primary care regarding elevated blood pressures.  Follow-up if symptoms do not improve, worsen or new symptoms occur.     ED Prescriptions     Medication Sig Dispense Auth. Provider   promethazine -dextromethorphan (PROMETHAZINE -DM) 6.25-15 MG/5ML syrup Take 5 mLs by mouth 4 (four) times daily as needed for cough. Do not use and drive - May make drowsy. 118 mL Ival Domino, FNP   predniSONE  (DELTASONE ) 20 MG tablet Take 1 tablet (20 mg total) by mouth daily with breakfast for 5 days. 5 tablet Amiliana Foutz, FNP   Albuterol-Budesonide (AIRSUPRA) 90-80 MCG/ACT AERO Inhale 2 puffs into the lungs every 4 (four) hours as needed (wheezing.  Rinse mouth after use). 10.7 g Ival Domino, FNP      PDMP not reviewed this encounter.    [1]  Social History Tobacco Use   Smoking status: Never   Smokeless tobacco: Never  Vaping Use   Vaping status: Never Used  Substance Use Topics   Alcohol use: Yes    Comment: socially   Drug use: Never     Ival Domino, FNP 04/24/24 1328  "

## 2024-04-24 NOTE — ED Triage Notes (Signed)
 Moist cough onset Christmas Day. Denies fever. Using dayquil.

## 2024-05-08 ENCOUNTER — Other Ambulatory Visit (HOSPITAL_BASED_OUTPATIENT_CLINIC_OR_DEPARTMENT_OTHER): Payer: Self-pay

## 2024-05-23 ENCOUNTER — Encounter: Payer: Self-pay | Admitting: Family Medicine

## 2024-05-23 DIAGNOSIS — M5431 Sciatica, right side: Secondary | ICD-10-CM

## 2024-05-31 ENCOUNTER — Other Ambulatory Visit (HOSPITAL_BASED_OUTPATIENT_CLINIC_OR_DEPARTMENT_OTHER): Payer: Self-pay

## 2024-05-31 MED ORDER — CYCLOBENZAPRINE HCL 10 MG PO TABS
10.0000 mg | ORAL_TABLET | Freq: Two times a day (BID) | ORAL | 0 refills | Status: AC
  Filled 2024-05-31: qty 28, 14d supply, fill #0

## 2024-05-31 MED ORDER — MELOXICAM 15 MG PO TABS
15.0000 mg | ORAL_TABLET | Freq: Every day | ORAL | 1 refills | Status: AC
  Filled 2024-05-31: qty 30, 30d supply, fill #0

## 2024-05-31 MED ORDER — PREDNISONE 10 MG PO TABS
ORAL_TABLET | ORAL | 0 refills | Status: AC
  Filled 2024-05-31: qty 42, 12d supply, fill #0
